# Patient Record
Sex: Female | Born: 1950 | Race: Black or African American | Hispanic: No | Smoking: Former smoker
Health system: Southern US, Community
[De-identification: ages and names within clinical notes are randomized; demographics above are authoritative.]

## PROBLEM LIST (undated history)

## (undated) DIAGNOSIS — H903 Sensorineural hearing loss, bilateral: Secondary | ICD-10-CM

## (undated) DIAGNOSIS — I10 Essential (primary) hypertension: Secondary | ICD-10-CM

## (undated) DIAGNOSIS — J40 Bronchitis, not specified as acute or chronic: Secondary | ICD-10-CM

## (undated) DIAGNOSIS — K219 Gastro-esophageal reflux disease without esophagitis: Secondary | ICD-10-CM

## (undated) DIAGNOSIS — M1990 Unspecified osteoarthritis, unspecified site: Secondary | ICD-10-CM

## (undated) DIAGNOSIS — F329 Major depressive disorder, single episode, unspecified: Secondary | ICD-10-CM

## (undated) DIAGNOSIS — Z79899 Other long term (current) drug therapy: Secondary | ICD-10-CM

## (undated) DIAGNOSIS — F419 Anxiety disorder, unspecified: Secondary | ICD-10-CM

## (undated) DIAGNOSIS — J302 Other seasonal allergic rhinitis: Secondary | ICD-10-CM

## (undated) DIAGNOSIS — F32A Depression, unspecified: Secondary | ICD-10-CM

## (undated) DIAGNOSIS — E1169 Type 2 diabetes mellitus with other specified complication: Secondary | ICD-10-CM

## (undated) DIAGNOSIS — E559 Vitamin D deficiency, unspecified: Secondary | ICD-10-CM

## (undated) DIAGNOSIS — D72820 Lymphocytosis (symptomatic): Secondary | ICD-10-CM

## (undated) DIAGNOSIS — E119 Type 2 diabetes mellitus without complications: Secondary | ICD-10-CM

## (undated) HISTORY — DX: Other seasonal allergic rhinitis: J30.2

## (undated) HISTORY — DX: Lymphocytosis (symptomatic): D72.820

## (undated) HISTORY — PX: EYE SURGERY: SHX253

## (undated) HISTORY — DX: Vitamin D deficiency, unspecified: E55.9

## (undated) HISTORY — DX: Depression, unspecified: F32.A

## (undated) HISTORY — PX: ABDOMINAL HYSTERECTOMY: SHX81

## (undated) HISTORY — DX: Major depressive disorder, single episode, unspecified: F32.9

## (undated) HISTORY — DX: Sensorineural hearing loss, bilateral: H90.3

## (undated) HISTORY — DX: Other long term (current) drug therapy: Z79.899

## (undated) HISTORY — PX: OTHER SURGICAL HISTORY: SHX169

## (undated) HISTORY — DX: Mixed hyperlipidemia: E11.69

## (undated) HISTORY — PX: TONSILLECTOMY: SUR1361

## (undated) HISTORY — DX: Unspecified osteoarthritis, unspecified site: M19.90

---

## 1998-06-01 ENCOUNTER — Other Ambulatory Visit: Admission: RE | Admit: 1998-06-01 | Discharge: 1998-06-01 | Payer: Self-pay | Admitting: Obstetrics & Gynecology

## 1998-11-26 ENCOUNTER — Encounter: Admission: RE | Admit: 1998-11-26 | Discharge: 1998-12-12 | Payer: Self-pay | Admitting: Family Medicine

## 1999-07-11 ENCOUNTER — Other Ambulatory Visit: Admission: RE | Admit: 1999-07-11 | Discharge: 1999-07-11 | Payer: Self-pay | Admitting: *Deleted

## 2004-03-19 ENCOUNTER — Ambulatory Visit (HOSPITAL_COMMUNITY): Admission: RE | Admit: 2004-03-19 | Discharge: 2004-03-19 | Payer: Self-pay | Admitting: Gastroenterology

## 2004-05-16 ENCOUNTER — Ambulatory Visit (HOSPITAL_COMMUNITY): Admission: RE | Admit: 2004-05-16 | Discharge: 2004-05-16 | Payer: Self-pay | Admitting: Orthopedic Surgery

## 2004-05-24 ENCOUNTER — Other Ambulatory Visit: Admission: RE | Admit: 2004-05-24 | Discharge: 2004-05-24 | Payer: Self-pay | Admitting: Family Medicine

## 2005-09-18 ENCOUNTER — Other Ambulatory Visit: Admission: RE | Admit: 2005-09-18 | Discharge: 2005-09-18 | Payer: Self-pay | Admitting: Family Medicine

## 2006-01-01 ENCOUNTER — Ambulatory Visit (HOSPITAL_BASED_OUTPATIENT_CLINIC_OR_DEPARTMENT_OTHER): Admission: RE | Admit: 2006-01-01 | Discharge: 2006-01-02 | Payer: Self-pay | Admitting: Orthopedic Surgery

## 2006-02-17 HISTORY — PX: ROTATOR CUFF REPAIR: SHX139

## 2006-07-23 ENCOUNTER — Ambulatory Visit: Payer: Self-pay | Admitting: Internal Medicine

## 2006-08-18 ENCOUNTER — Ambulatory Visit: Payer: Self-pay | Admitting: Internal Medicine

## 2006-10-27 ENCOUNTER — Ambulatory Visit: Payer: Self-pay | Admitting: Internal Medicine

## 2007-02-18 HISTORY — PX: BUNIONECTOMY: SHX129

## 2007-09-14 ENCOUNTER — Encounter: Admission: RE | Admit: 2007-09-14 | Discharge: 2007-09-14 | Payer: Self-pay | Admitting: Family Medicine

## 2008-02-28 ENCOUNTER — Ambulatory Visit: Payer: Self-pay | Admitting: Family Medicine

## 2008-02-28 DIAGNOSIS — M2140 Flat foot [pes planus] (acquired), unspecified foot: Secondary | ICD-10-CM | POA: Insufficient documentation

## 2008-03-13 ENCOUNTER — Ambulatory Visit: Payer: Self-pay | Admitting: Family Medicine

## 2008-12-05 ENCOUNTER — Encounter: Admission: RE | Admit: 2008-12-05 | Discharge: 2008-12-05 | Payer: Self-pay | Admitting: Family Medicine

## 2008-12-14 ENCOUNTER — Encounter: Admission: RE | Admit: 2008-12-14 | Discharge: 2008-12-14 | Payer: Self-pay | Admitting: Family Medicine

## 2010-02-22 ENCOUNTER — Encounter
Admission: RE | Admit: 2010-02-22 | Discharge: 2010-02-22 | Payer: Self-pay | Source: Home / Self Care | Attending: Family Medicine | Admitting: Family Medicine

## 2010-04-06 ENCOUNTER — Inpatient Hospital Stay (INDEPENDENT_AMBULATORY_CARE_PROVIDER_SITE_OTHER)
Admission: RE | Admit: 2010-04-06 | Discharge: 2010-04-06 | Disposition: A | Discharge status: Home / Self Care | Payer: Commercial Indemnity | Source: Ambulatory Visit | Attending: Family Medicine | Admitting: Family Medicine

## 2010-04-06 DIAGNOSIS — G5750 Tarsal tunnel syndrome, unspecified lower limb: Secondary | ICD-10-CM

## 2010-07-05 NOTE — Op Note (Signed)
NAMECHARMIKA, Alyssa Armstrong             ACCOUNT NO.:  [id number]   MEDICAL RECORD NO.:  [id number]          PATIENT TYPE:  AMB   LOCATION:  DSC                          FACILITY:  [facility]   PHYSICIAN:  Katy Fitch. Sypher, M.D. DATE OF BIRTH:  Jul 05, 1950   DATE OF PROCEDURE:  01/01/2006  DATE OF DISCHARGE:                                 OPERATIVE REPORT   PREOPERATIVE DIAGNOSIS:  Chronic stage III impingement, right shoulder, with  both plain film and MRI-documented acromioclavicular joint arthropathy with  compromise of the supraspinatus outlet and sclerosis of the greater  tuberosity suggesting chronic rotator cuff tear.  MRI preoperatively on  November 13, 2005, demonstrated a full-thickness rotator cuff tear of the  supraspinatus and conjoined tendon.   POSTOPERATIVE DIAGNOSES:  1. Degenerative type 1 superior labrum anterior and posterior labral tear.  2. Full-thickness deep surface rotator cuff tear of supraspinatus and      infraspinatus tendons.  3. Chronic acromioclavicular joint arthropathy with bursitis and full-      thickness rotator cuff tear due to chronic stage III impingement.   OPERATION:  1. Diagnostic arthroscopy, right glenohumeral joint, followed by      arthroscopic debridement of labrum, degenerative rotator cuff tear, and      arthroscopic synovectomy.  2. Arthroscopic subacromial decompression with bursectomy, coracoacromial      ligament release and acromioplasty.  3. Arthroscopic distal clavicle resection.  4. Muscle split mini incision open reconstruction of supraspinatus and      infraspinatus chronic rotator cuff tear.   OPERATING SURGEON:  Katy Fitch. Sypher, M.D.   ASSISTANT:  Molly Maduro Dasnoit PA-C.   ANESTHESIA:  General endotracheal supplemented by a right interscalene  block, supervising anesthesiologist is Dr. Gypsy Balsam.   INDICATIONS:  Alyssa Armstrong is a 60 year old woman whom I have followed  for 5 years for right shoulder pain.  She has a history  of AC arthropathy  and stage II impingement in 2002 that was treated with injection and  therapy.  She subsequently returned 2 years later and had increased signs of  impingement and a probable stage III evolution to a rotator cuff tear.  At  the time we advised her to consider arthroscopic intervention anticipating  decompression, distal clavicle resection and possible repair the cuff.  Alyssa Armstrong decided to wait for a while longer.  She then returned in the late  summer of 2007 with significant night pain, weakness of abduction and signs  of chronic stage III impingement.   A repeat MRI in November 13, 2005, documented a degenerative tendinopathy  of the supraspinatus and infraspinatus tendons, labral degenerative changes,  and unfavorable acromial and AC anatomy.   We recommended proceeding with diagnostic arthroscopy at this time  anticipating subacromial decompression, distal clavicle resection,  debridement of her labrum and a probable rotator cuff repair.   Preoperatively she was seen in consultation by Dr. Gypsy Balsam, who recommended an  interscalene block as well as general endotracheal anesthesia.   After informed consent, Dr. Gypsy Balsam placed an interscalene block in the  holding area with excellent anesthesia of the right forequarter.  After informed consent, [name] is brought to the operating room at  this time.   PROCEDURE:  Alyssa Armstrong is brought to operating room and placed in  supine position upon the operating table.   Following the induction of general endotracheal anesthesia, she was  carefully positioned in the beach-chair position with the aid of a torso and  head holder designed for shoulder arthroscopy.  PAS compression devices were  used on her calves.   The entire right upper extremity and forequarter were prepped with DuraPrep  and draped with impervious arthroscopy drapes.  The procedure commenced with  distension of her right shoulder with 20  mL of sterile saline brought in  through an anterior spinal needle approach.  The scope was placed  atraumatically through a standard posterior viewing portal using blunt  technique.  Diagnostic arthroscopy revealed a type 1 SLAP lesion with  degenerative labral changes, moderate synovitis anteriorly, and a full  thickness deep surface rotator cuff tear involving 90% of the supraspinatus  and 85% of the infraspinatus.   Photographic documentation of the rotator cuff predicament was accomplished,  followed by debridement of the labrum, synovitis and deep surface rotator  cuff fragments with a 4.5-mm suction shaver.   The arthroscope was removed from the glenohumeral joint and placed in the  subacromial space.  A florid bursitis was noted.  After bursectomy, the  anatomy of the coracoacromial arch was studied.  There was a large medial  osteophyte at the acromial side of the [facility] joint and a prominent inferior  distal clavicle.   The AC capsule was taken down and after photographic documentation of the [facility]  anatomy, the coracoacromial ligament was released with the cutting cautery.  Hemostasis was achieved with bipolar cautery.  The acromion was leveled to a  type 1 morphology and the distal 18 mm of clavicle was removed with the  arthroscopic bur.  After a thorough bursectomy and cleaning of all debris  from the distal clavicle resection, the cuff was carefully inspected.  There  was noted to be a 1 cm window full-thickness through the supraspinatus that  allowed visualization of the cuff tear with the scope.  The entire  supraspinatus and most of the infraspinatus was undermined and degenerative.   We then converted to an open muscle-splitting mini incision rotator cuff  repair.   A 3 cm incision was fashioned at the junction the anterior and middle third  of the deltoid.  The raphe of the muscle was split, the bursa was incised, and the rotator cuff tear visualized directly.  The  margins of the bursal  surface tear were enlarged to allow full access to the greater tuberosity.  The greater tuberosity was decorticated with a power bur and its profile  lowered approximately 3 mm.  Two biocorkscrew anchors were placed, one at  the central portion of the infraspinatus at the medial margin of the greater  tuberosity at the hyaline articular cartilage line and a second at the  center of the supraspinatus.  A single McLaughlin-type grasping suture was  placed at the conjoint tendon and was used to advance the conjoint tendon  anatomically laterally, inserting on the greater tuberosity through bone  holes.  Prior to tying the McLaughlin suture, the biocorkscrew sutures were  placed for a total of four mattress sutures insetting the supraspinatus and  infraspinatus anatomically on the decorticated greater tuberosity.   The McLaughlin suture was then tensioned with the arm in abduction,  advancing  the cuff anatomically.  The redundant tendon tissues were removed  with scalpel dissection, followed by tying of the mattress sutures insetting  the tendons to the decorticated tuberosity.  The repair was then finished  along its margin with a series of figure-of-eight 0 Vicryl sutures with  knots buried.   The deltoid split was then repaired with a corner suture of #2 FiberWire to  the periosteum of the greater tuberosity to reinforce the deltoid insertion,  and the deltoid split was repaired with simple suture of 0 Vicryl with knots  buried.  The skin was repaired with subdermal suture of 2-0 Vicryl and  intradermal 3-0 Prolene and Steri-Strips.   For aftercare Alyssa Armstrong's wounds were dressed with Xeroflo, sterile  gauze and paper tape.  She will be admitted to the recovery care center for  observation of her vital signs.  We anticipate IV Ancef 1 g q.8h. x3 doses  as a prophylactic antibiotic and appropriate analgesics in the form of p.o.  and IV Dilaudid and IV PCA  morphine if her block wears off this evening.  There were no apparent complications.   Alyssa Armstrong was transferred to the recovery room with stable vital signs.      Katy Fitch Sypher, M.D.  Electronically Signed     RVS/MEDQ  D:  01/01/2006  T:  01/01/2006  Job:  [id number]

## 2010-07-05 NOTE — Op Note (Signed)
[name], [name]             ACCOUNT NO.:  [id number]   MEDICAL RECORD NO.:  [id number]          PATIENT TYPE:  AMB   LOCATION:  ENDO                         [facility]ACILITY:  [facility]   PHYSICIAN:  Danise Edge, M.D.   DATE OF BIRTH:  November 23, 1950   DATE OF PROCEDURE:  03/19/2004  DATE OF DISCHARGE:                                 OPERATIVE REPORT   PROCEDURE:  Diagnostic colonoscopy.   INDICATIONS FOR PROCEDURE:  Alyssa Armstrong is a 60 year old female born  1950-07-12.  Alyssa Armstrong is undergoing diagnostic colonoscopy to  evaluate intermittent painless hematochezia.   ENDOSCOPIST:  Danise Edge, M.D.   PREMEDICATION:  Versed 6 mg, Demerol 60 mg.   DESCRIPTION OF PROCEDURE:  After obtaining informed consent, Alyssa Armstrong  was placed in the left lateral decubitus position.  I administered  intravenous Demerol and intravenous Versed to achieve conscious sedation for  the procedure.  The patient's blood pressure, oxygen saturation, and cardiac  rhythm were monitored throughout the procedure and documented in the medical  record.   Anal inspection and digital rectal exam were normal.  The prostate was  nonnodular.  The Olympus adjustable pediatric colonoscope was introduced  into the rectum and advanced to the cecum.  The colonic preparation for the  exam today was excellent.   Rectum:  Normal.   Sigmoid colon and descending colon:  Normal.   Splenic flexure:  Normal.   Transverse colon:  Normal.   Hepatic flexure:  Normal.   Ascending colon:  Normal.   Cecum and ileocecal valve:  Normal.   ASSESSMENT:  Normal proctocolonoscopy to the cecum.      MJ/MEDQ  D:  03/19/2004  T:  03/19/2004  Job:  [id number]   cc:   Gretta Arab. Valentina Lucks, M.D.  [address] [place]  [place] [address]  Fax: [phone]

## 2010-10-14 ENCOUNTER — Emergency Department (HOSPITAL_COMMUNITY)
Admission: EM | Admit: 2010-10-14 | Discharge: 2010-10-14 | Disposition: A | Discharge status: Home / Self Care | Payer: Commercial Indemnity | Attending: Emergency Medicine | Admitting: Emergency Medicine

## 2010-10-14 ENCOUNTER — Inpatient Hospital Stay (INDEPENDENT_AMBULATORY_CARE_PROVIDER_SITE_OTHER)
Admission: RE | Admit: 2010-10-14 | Discharge: 2010-10-14 | Disposition: A | Discharge status: Home / Self Care | Payer: Commercial Indemnity | Source: Ambulatory Visit | Attending: Family Medicine | Admitting: Family Medicine

## 2010-10-14 ENCOUNTER — Emergency Department (HOSPITAL_COMMUNITY): Payer: Commercial Indemnity

## 2010-10-14 DIAGNOSIS — R1013 Epigastric pain: Secondary | ICD-10-CM | POA: Insufficient documentation

## 2010-10-14 DIAGNOSIS — R10812 Left upper quadrant abdominal tenderness: Secondary | ICD-10-CM

## 2010-10-14 DIAGNOSIS — E7800 Pure hypercholesterolemia, unspecified: Secondary | ICD-10-CM | POA: Insufficient documentation

## 2010-10-14 DIAGNOSIS — R112 Nausea with vomiting, unspecified: Secondary | ICD-10-CM | POA: Insufficient documentation

## 2010-10-14 LAB — BASIC METABOLIC PANEL
CO2: 29 mEq/L (ref 19–32)
Chloride: 103 mEq/L (ref 96–112)
Creatinine, Ser: 0.76 mg/dL (ref 0.50–1.10)
GFR calc Af Amer: >60 mL/min (ref >60)
Potassium: 4 mEq/L (ref 3.5–5.1)
Sodium: 141 mEq/L (ref 135–145)

## 2010-10-14 LAB — DIFFERENTIAL
Basophils Absolute: 0 10*3/uL (ref 0.0–0.1)
Basophils Relative: 1 % (ref 0–1)
Eosinophils Absolute: 0.1 10*3/uL (ref 0.0–0.7)
Eosinophils Relative: 1 % (ref 0–5)
Neutrophils Relative %: 38 % — ABNORMAL LOW (ref 43–77)

## 2010-10-14 LAB — HEPATIC FUNCTION PANEL
AST: 25 U/L (ref 0–37)
Bilirubin, Direct: <0.1 mg/dL (ref 0.0–0.3)
Total Protein: 8.2 g/dL (ref 6.0–8.3)

## 2010-10-14 LAB — CBC
MCV: 73.8 fL — ABNORMAL LOW (ref 78.0–100.0)
Platelets: 283 10*3/uL (ref 150–400)
RBC: 5.26 MIL/uL — ABNORMAL HIGH (ref 3.87–5.11)
RDW: 14.7 % (ref 11.5–15.5)
WBC: 7.8 10*3/uL (ref 4.0–10.5)

## 2010-10-14 LAB — URINALYSIS, ROUTINE W REFLEX MICROSCOPIC
Ketones, ur: NEGATIVE mg/dL
Leukocytes, UA: NEGATIVE
Nitrite: NEGATIVE
pH: 5.5 (ref 5.0–8.0)

## 2011-04-28 DIAGNOSIS — J302 Other seasonal allergic rhinitis: Secondary | ICD-10-CM | POA: Insufficient documentation

## 2011-04-28 DIAGNOSIS — E559 Vitamin D deficiency, unspecified: Secondary | ICD-10-CM | POA: Insufficient documentation

## 2011-10-21 ENCOUNTER — Encounter (HOSPITAL_COMMUNITY): Payer: Self-pay | Admitting: *Deleted

## 2011-10-21 ENCOUNTER — Emergency Department (INDEPENDENT_AMBULATORY_CARE_PROVIDER_SITE_OTHER): Payer: 59

## 2011-10-21 ENCOUNTER — Emergency Department (INDEPENDENT_AMBULATORY_CARE_PROVIDER_SITE_OTHER)
Admission: EM | Admit: 2011-10-21 | Discharge: 2011-10-21 | Disposition: A | Discharge status: Home / Self Care | Payer: 59 | Source: Home / Self Care | Attending: Family Medicine | Admitting: Family Medicine

## 2011-10-21 DIAGNOSIS — R112 Nausea with vomiting, unspecified: Secondary | ICD-10-CM

## 2011-10-21 DIAGNOSIS — J40 Bronchitis, not specified as acute or chronic: Secondary | ICD-10-CM

## 2011-10-21 HISTORY — DX: Gastro-esophageal reflux disease without esophagitis: K21.9

## 2011-10-21 LAB — POCT URINALYSIS DIP (DEVICE)
Bilirubin Urine: NEGATIVE
Glucose, UA: NEGATIVE mg/dL
Ketones, ur: NEGATIVE mg/dL
Nitrite: NEGATIVE

## 2011-10-21 MED ORDER — ONDANSETRON HCL 4 MG PO TABS: 4.0000 mg | ORAL_TABLET | Freq: Four times a day (QID) | ORAL | Status: AC

## 2011-10-21 MED ORDER — ALBUTEROL SULFATE HFA 108 (90 BASE) MCG/ACT IN AERS: 2.0000 | INHALATION_SPRAY | RESPIRATORY_TRACT | Status: DC | PRN

## 2011-10-21 MED ORDER — ONDANSETRON 4 MG PO TBDP
4.0000 mg | ORAL_TABLET | Freq: Once | ORAL | Status: AC
  Administered 2011-10-21: 4 mg via ORAL

## 2011-10-21 MED ORDER — ONDANSETRON 4 MG PO TBDP
ORAL_TABLET | ORAL | Status: AC
  Filled 2011-10-21: qty 1

## 2011-10-21 NOTE — ED Notes (Signed)
Pt reports vomiting multiple times a day since Saturday. No diarrhea or abdomen pain. Pt also reports hard nodule in center of chest that has recently formed. Previous similar episode in March with no known cause.

## 2011-10-21 NOTE — ED Provider Notes (Signed)
History     CSN: [id number]  Arrival date & time 10/21/11  1436   First MD Initiated Contact with Patient 10/21/11 1626      Chief Complaint  Patient presents with  . Emesis    (Consider location/radiation/quality/duration/timing/severity/associated sxs/prior treatment) HPI See previous note from Lonia Skinner [place], [place] Results of UA and Chest xray below. Past Medical History  Diagnosis Date  . GERD (gastroesophageal reflux disease)     Past Surgical History  Procedure Date  . Rotator cuff suregery     Family History  Problem Relation Age of Onset  . Family history unknown: Yes    History  Substance Use Topics  . Smoking status: Never Smoker   . Smokeless tobacco: Not on file  . Alcohol Use: No    OB History    Grav Para Term Preterm Abortions TAB SAB Ect Mult Living                  Review of Systems  Allergies  Vicodin  Home Medications   Current Outpatient Rx  Name Route Sig Dispense Refill  . ATORVASTATIN CALCIUM 10 MG PO TABS Oral Take 10 mg by mouth daily.    Marland Kitchen VITAMIN D 1000 UNITS PO TABS Oral Take 1,000 Units by mouth daily.    Marland Kitchen RANITIDINE HCL 150 MG PO CAPS Oral Take 150 mg by mouth 2 (two) times daily.    . VENLAFAXINE HCL 100 MG PO TABS Oral Take 100 mg by mouth 2 (two) times daily.    . ALBUTEROL SULFATE HFA 108 (90 BASE) MCG/ACT IN AERS Inhalation Inhale 2 puffs into the lungs every 4 (four) hours as needed for wheezing. 1 Inhaler 1  . ONDANSETRON HCL 4 MG PO TABS Oral Take 1 tablet (4 mg total) by mouth every 6 (six) hours. 12 tablet 0    BP 141/70  Pulse 80  Temp 98.7 F (37.1 C) (Oral)  Resp 16  SpO2 100%  Physical Exam  Nursing note and vitals reviewed. Constitutional: She is oriented to person, place, and time. Vital signs are normal. She appears well-developed and well-nourished. She is active and cooperative.  HENT:  Head: Normocephalic.  Eyes: Conjunctivae are normal. Pupils are equal, round, and reactive to light. No scleral  icterus.  Neck: Trachea normal. Neck supple.  Cardiovascular: Normal rate and regular rhythm.   Pulmonary/Chest: Effort normal and breath sounds normal. No respiratory distress. She has no wheezes. She has no rales.  Abdominal: Soft. Bowel sounds are normal. There is no tenderness.  Neurological: She is alert and oriented to person, place, and time. No cranial nerve deficit or sensory deficit.  Skin: Skin is warm and dry.  Psychiatric: She has a normal mood and affect. Her speech is normal and behavior is normal. Judgment and thought content normal. Cognition and memory are normal.    ED Course  Procedures (including critical care time)  Labs Reviewed  POCT URINALYSIS DIP (DEVICE) - Abnormal; Notable for the following:    Leukocytes, UA LARGE (*)  Biochemical Testing Only. Please order routine urinalysis from main lab if confirmatory testing is needed.   All other components within normal limits   Dg Chest 2 View  10/21/2011  *RADIOLOGY REPORT*  Clinical Data: Weakness, vomiting, nausea  CHEST - 2 VIEW  Comparison: None.  Findings: Normal cardiac silhouette and mediastinal contours. There is mild diffuse thickening of the pulmonary interstitium.  No focal airspace opacities.  Mild eventration right hemidiaphragm. No pleural effusion  or pneumothorax.  No acute osseous abnormalities.  IMPRESSION: Overall findings most suggestive of airways disease/bronchitis.  No focal airspace opacities to suggest pneumonia.   Original Report Authenticated By: Waynard Reeds, M.D.      1. Nausea & vomiting   2. Bronchitis       MDM  Zofran for nausea, albuterol and take your zyrtec for cough symptoms.          Johnsie Kindred, NP 10/21/11 1637

## 2011-10-21 NOTE — ED Provider Notes (Signed)
History     CSN: [id number]  Arrival date & time 10/21/11  1436   None     Chief Complaint  Patient presents with  . Emesis    (Consider location/radiation/quality/duration/timing/severity/associated sxs/prior treatment) Patient is a 61 y.o. female presenting with vomiting. The history is provided by the patient. No language interpreter was used.  Emesis  This is a new problem. Episode onset: 2 days ago. The problem has not changed since onset.There has been no fever. Pertinent negatives include no abdominal pain and no chills.  Pt complains of vomiting on and off since Saturday.  Pt reports she had the same thing in March.  Pt reports she has this about every 6 months.  Pt complains of a knot upper abdomen.  I palpated with pt,  Area feels like end of xiphoid Past Medical History  Diagnosis Date  . GERD (gastroesophageal reflux disease)     Past Surgical History  Procedure Date  . Rotator cuff suregery     Family History  Problem Relation Age of Onset  . Family history unknown: Yes    History  Substance Use Topics  . Smoking status: Never Smoker   . Smokeless tobacco: Not on file  . Alcohol Use: No    OB History    Grav Para Term Preterm Abortions TAB SAB Ect Mult Living                  Review of Systems  Constitutional: Negative for chills.  Gastrointestinal: Positive for vomiting. Negative for abdominal pain.  All other systems reviewed and are negative.    Allergies  Vicodin  Home Medications   Current Outpatient Rx  Name Route Sig Dispense Refill  . ATORVASTATIN CALCIUM 10 MG PO TABS Oral Take 10 mg by mouth daily.    Marland Kitchen VITAMIN D 1000 UNITS PO TABS Oral Take 1,000 Units by mouth daily.    Marland Kitchen RANITIDINE HCL 150 MG PO CAPS Oral Take 150 mg by mouth 2 (two) times daily.    . VENLAFAXINE HCL 100 MG PO TABS Oral Take 100 mg by mouth 2 (two) times daily.      BP 141/70  Pulse 80  Temp 98.7 F (37.1 C) (Oral)  Resp 16  SpO2 100%  Physical Exam    Nursing note and vitals reviewed. Constitutional: She appears well-developed.  HENT:  Head: Normocephalic and atraumatic.  Right Ear: External ear normal.  Left Ear: External ear normal.  Nose: Nose normal.  Mouth/Throat: Oropharynx is clear and moist.  Eyes: Conjunctivae are normal. Pupils are equal, round, and reactive to light.  Neck: Normal range of motion.  Cardiovascular: Normal rate and normal heart sounds.   Pulmonary/Chest: Effort normal and breath sounds normal.  Abdominal: Soft. She exhibits no distension. There is no tenderness. There is no guarding.  Musculoskeletal: Normal range of motion.  Neurological: She is alert.  Skin: Skin is warm.  Psychiatric: She has a normal mood and affect.   Chest,  Knot pt feels seems to be end of xiphoid.   ED Course  Procedures (including critical care time)  Labs Reviewed - No data to display No results found.   No diagnosis found.    MDM  Care turned over to [facility] NP.  UA and chest xray pending,  Fluid challenge after xray if not able to drink,  To ED.           Lonia Skinner [place], [place] 10/21/11 [phone]

## 2011-10-22 NOTE — ED Provider Notes (Signed)
Medical screening examination/treatment/procedure(s) were performed by resident physician or non-physician practitioner and as supervising physician I was immediately available for consultation/collaboration.   Barkley Bruns MD.    Linna Hoff, MD 10/22/11 2131

## 2011-10-22 NOTE — ED Provider Notes (Signed)
Medical screening examination/treatment/procedure(s) were performed by resident physician or non-physician practitioner and as supervising physician I was immediately available for consultation/collaboration.   Hamad Whyte DOUGLAS MD.    Alzena Gerber D Amando Chaput, MD 10/22/11 2132 

## 2011-11-15 ENCOUNTER — Observation Stay (HOSPITAL_COMMUNITY)
Admission: EM | Admit: 2011-11-15 | Discharge: 2011-11-16 | Disposition: A | Discharge status: Home / Self Care | Payer: 59 | Attending: Emergency Medicine | Admitting: Emergency Medicine

## 2011-11-15 ENCOUNTER — Encounter (HOSPITAL_COMMUNITY): Payer: Self-pay | Admitting: Emergency Medicine

## 2011-11-15 DIAGNOSIS — K089 Disorder of teeth and supporting structures, unspecified: Secondary | ICD-10-CM | POA: Insufficient documentation

## 2011-11-15 DIAGNOSIS — L03211 Cellulitis of face: Principal | ICD-10-CM | POA: Insufficient documentation

## 2011-11-15 DIAGNOSIS — K044 Acute apical periodontitis of pulpal origin: Secondary | ICD-10-CM | POA: Insufficient documentation

## 2011-11-15 DIAGNOSIS — L0201 Cutaneous abscess of face: Principal | ICD-10-CM | POA: Insufficient documentation

## 2011-11-15 DIAGNOSIS — K047 Periapical abscess without sinus: Secondary | ICD-10-CM

## 2011-11-15 LAB — CBC
HCT: 41.7 % (ref 36.0–46.0)
MCHC: 33.6 g/dL (ref 30.0–36.0)
MCV: 74.9 fL — ABNORMAL LOW (ref 78.0–100.0)
RDW: 15.2 % (ref 11.5–15.5)

## 2011-11-15 LAB — BASIC METABOLIC PANEL
BUN: 7 mg/dL (ref 6–23)
Calcium: 9.8 mg/dL (ref 8.4–10.5)
Creatinine, Ser: 0.73 mg/dL (ref 0.50–1.10)
GFR calc Af Amer: >90 mL/min (ref >90)
GFR calc non Af Amer: >90 mL/min (ref >90)
Potassium: 3.9 mEq/L (ref 3.5–5.1)

## 2011-11-15 MED ORDER — SODIUM CHLORIDE 0.9 % IV SOLN
1000.0000 mL | INTRAVENOUS | Status: DC
  Administered 2011-11-15 – 2011-11-16 (×3): 1000 mL via INTRAVENOUS

## 2011-11-15 MED ORDER — CLINDAMYCIN PHOSPHATE 600 MG/50ML IV SOLN
600.0000 mg | Freq: Three times a day (TID) | INTRAVENOUS | Status: DC
  Administered 2011-11-15 – 2011-11-16 (×2): 600 mg via INTRAVENOUS
  Filled 2011-11-15 (×2): qty 50

## 2011-11-15 MED ORDER — MORPHINE SULFATE 4 MG/ML IJ SOLN
4.0000 mg | INTRAMUSCULAR | Status: DC | PRN
  Administered 2011-11-15 – 2011-11-16 (×2): 4 mg via INTRAVENOUS
  Filled 2011-11-15 (×2): qty 1

## 2011-11-15 MED ORDER — ONDANSETRON HCL 4 MG/2ML IJ SOLN
4.0000 mg | Freq: Four times a day (QID) | INTRAMUSCULAR | Status: DC | PRN
  Administered 2011-11-15: 4 mg via INTRAVENOUS
  Filled 2011-11-15: qty 2

## 2011-11-15 NOTE — ED Notes (Signed)
Reported given [facility] manager. Transported to CDU 9 by Josh NT.

## 2011-11-15 NOTE — ED Provider Notes (Signed)
History  This chart was scribed for Ward Givens, MD by Bennett Scrape. This patient was seen in room TR04C/TR04C and the patient's care was started at 6:05PM.  CSN: [id number]  Arrival date & time 11/15/11  1434   None     Chief Complaint  Patient presents with  . Dental Pain    The history is provided by the patient. No language interpreter was used.    Alyssa Armstrong is a 61 y.o. female who presents to the Emergency Department complaining of 2 months of gradual onset, gradually worsening, constant right upper dental pain with associated right sided facial swelling that she noticed this morning. States her tooth pain got worse last night. The pain is worse with chewing. She reports that she has experienced similar dental problems in the past that needed to be extracted. She does not have a dentist. She denies fever, nausea, emesis, and HA as associated symptoms.She has a h/o GERD. She denies smoking and alcohol use.  She denies having a dentist currently. Dr. Duanne Guess is PCP.  Past Medical History  Diagnosis Date  . GERD (gastroesophageal reflux disease)     Past Surgical History  Procedure Date  . Rotator cuff suregery     No family history on file.  History  Substance Use Topics  . Smoking status: Never Smoker   . Smokeless tobacco: Not on file  . Alcohol Use: No  works as an Nurse, mental health  No OB history provided.  Review of Systems  Constitutional: Negative for fever and chills.  HENT: Positive for facial swelling and dental problem. Negative for trouble swallowing.   Gastrointestinal: Negative for nausea and vomiting.    Allergies  Vicodin  Home Medications   Current Outpatient Rx  Name Route Sig Dispense Refill  . ALBUTEROL SULFATE HFA 108 (90 BASE) MCG/ACT IN AERS Inhalation Inhale 2 puffs into the lungs every 4 (four) hours as needed for wheezing. 1 Inhaler 1  . ATORVASTATIN CALCIUM 10 MG PO TABS Oral Take 10 mg by mouth every evening.     Marland Kitchen  RANITIDINE HCL 150 MG PO CAPS Oral Take 150 mg by mouth 2 (two) times daily.    . VENLAFAXINE HCL 100 MG PO TABS Oral Take 100 mg by mouth 2 (two) times daily.    Marland Kitchen VITAMIN D (ERGOCALCIFEROL) 50000 UNITS PO CAPS Oral Take 50,000 Units by mouth every 7 (seven) days. Take on Monday      Triage Vitals: BP 145/73  Pulse 82  Temp 98.5 F (36.9 C) (Oral)  Resp 20  SpO2 100%  Vital signs normal    Physical Exam  Nursing note and vitals reviewed. Constitutional: She is oriented to person, place, and time. She appears well-developed and well-nourished. No distress.  HENT:  Head: Normocephalic and atraumatic.  Right Ear: External ear normal.  Left Ear: External ear normal.  Nose: Nose normal.       Missing several molars on the left lower jaw, right upper first premolar is tender, mild diffuse swelling of right face especially around the mid cheek and nose, tender to palpation in the same area of the infected tooth, jaw is non-tender to palpation. No obvious cavity seen.  Eyes: Conjunctivae normal and EOM are normal. Pupils are equal, round, and reactive to light.  Neck: Normal range of motion. Neck supple. No tracheal deviation present.  Cardiovascular: Normal rate.   Pulmonary/Chest: Effort normal. No respiratory distress.  Musculoskeletal: Normal range of motion.  Neurological: She is  alert and oriented to person, place, and time.  Skin: Skin is warm and dry.  Psychiatric: She has a normal mood and affect. Her behavior is normal.    ED Course  Procedures (including critical care time)   Medications  0.9 %  sodium chloride infusion (1000 mL Intravenous New Bag/Given 11/15/11 1933)  clindamycin (CLEOCIN) IVPB 600 mg (not administered)  morphine 4 MG/ML injection 4 mg (4 mg Intravenous Given 11/15/11 1933)  ondansetron (ZOFRAN) injection 4 mg (4 mg Intravenous Given 11/15/11 1933)     DIAGNOSTIC STUDIES: Oxygen Saturation is 100% on room air, normal by my interpretation.     COORDINATION OF CARE: 7:12PM-Discussed treatment plan which includes IV pain medications and antibiotics with pt at bedside and pt agreed to plan. Pt will be moved to CDU. Upon discharge, she will be provided with a referral to a dentist.   Results for orders placed during the hospital encounter of 11/15/11  CBC      Component Value Range   WBC 7.0  4.0 - 10.5 K/uL   RBC 5.57 (*) 3.87 - 5.11 MIL/uL   Hemoglobin 14.0  12.0 - 15.0 g/dL   HCT 16.1  09.6 - 04.5 %   MCV 74.9 (*) 78.0 - 100.0 fL   MCH 25.1 (*) 26.0 - 34.0 pg   MCHC 33.6  30.0 - 36.0 g/dL   RDW 40.9  81.1 - 91.4 %   Platelets 241  150 - 400 K/uL  BASIC METABOLIC PANEL      Component Value Range   Sodium 138  135 - 145 mEq/L   Potassium 3.9  3.5 - 5.1 mEq/L   Chloride 102  96 - 112 mEq/L   CO2 25  19 - 32 mEq/L   Glucose, Bld 97  70 - 99 mg/dL   BUN 7  6 - 23 mg/dL   Creatinine, Ser 7.82  0.50 - 1.10 mg/dL   Calcium 9.8  8.4 - 95.6 mg/dL   GFR calc non Af Amer >90  >90 mL/min   GFR calc Af Amer >90  >90 mL/min   Laboratory interpretation all normal      1. Facial cellulitis   2. Infected tooth     Plan place on cellulitis protocol and then discharge   Devoria Albe, MD, FACEP   MDM   I personally performed the services described in this documentation, which was scribed in my presence. The recorded information has been reviewed and considered.  Heide Scales, MD 11/15/11 2020

## 2011-11-15 NOTE — ED Notes (Signed)
Pt c/o right upper toothache onset last night.

## 2011-11-15 NOTE — ED Notes (Signed)
Patient was treated for sinus infection approx 2 mths ago,  She has swelling on the right side of her face.  Patient states in the past 2 days the swelling has returned and is worse.  She has swelling and pain in the right side of her face from below her eye to her neck 

## 2011-11-15 NOTE — ED Notes (Signed)
Patient was treated for sinus infection approx 2 mths ago,  She has swelling on the right side of her face.  Patient states in the past 2 days the swelling has returned and is worse.  She has swelling and pain in the right side of her face from below her eye to her neck

## 2011-11-16 MED ORDER — CLINDAMYCIN HCL 150 MG PO CAPS: 450.0000 mg | ORAL_CAPSULE | Freq: Three times a day (TID) | ORAL | Status: DC

## 2011-11-16 MED ORDER — OXYCODONE-ACETAMINOPHEN 5-325 MG PO TABS: 1.0000 | ORAL_TABLET | Freq: Four times a day (QID) | ORAL | Status: DC | PRN

## 2011-11-16 NOTE — ED Notes (Addendum)
Pt. Denies any pain presently.   Pt. Has redness and mild swelling to her rt. Cheek/facial area.  The swelling radiates from under her rt. Eye  Into her neck

## 2011-11-16 NOTE — ED Provider Notes (Signed)
Assumed care of patient in the CDU.  Patient is currently on Cellulitis Protocol.  Patient has received two doses on IV Clindamycin.  She reports that her pain and swelling has improved at this time.  VSS.  Patient alert and orientated, No acute distress, Poor dentition, Tenderness to palpation of the gingiva in the area of the infected tooth, Full ROM of the neck, No difficulty swallowing, No submental or submandibular lymphadenopathy.  Will discharge patient home with prescription for Clindamycin and pain medication.  Patient given referral to Dentist on call.    Pascal Lux [place], PA-C 11/16/11 1413

## 2011-11-16 NOTE — ED Provider Notes (Signed)
Patient was seen by me in her care was managed in the CDU by PA.  Ward Givens, MD 11/16/11 2035

## 2012-03-03 ENCOUNTER — Other Ambulatory Visit: Payer: Self-pay | Admitting: Family Medicine

## 2012-03-03 DIAGNOSIS — Z1231 Encounter for screening mammogram for malignant neoplasm of breast: Secondary | ICD-10-CM

## 2012-03-04 ENCOUNTER — Ambulatory Visit
Admission: RE | Admit: 2012-03-04 | Discharge: 2012-03-04 | Disposition: A | Discharge status: Home / Self Care | Payer: 59 | Source: Ambulatory Visit | Attending: Family Medicine | Admitting: Family Medicine

## 2012-03-04 DIAGNOSIS — Z1231 Encounter for screening mammogram for malignant neoplasm of breast: Secondary | ICD-10-CM

## 2012-03-25 ENCOUNTER — Encounter (HOSPITAL_COMMUNITY): Payer: Self-pay | Admitting: *Deleted

## 2012-03-25 ENCOUNTER — Emergency Department (HOSPITAL_COMMUNITY)
Admission: EM | Admit: 2012-03-25 | Discharge: 2012-03-26 | Disposition: A | Discharge status: Home / Self Care | Payer: 59 | Attending: Emergency Medicine | Admitting: Emergency Medicine

## 2012-03-25 DIAGNOSIS — I10 Essential (primary) hypertension: Secondary | ICD-10-CM | POA: Insufficient documentation

## 2012-03-25 DIAGNOSIS — Z8709 Personal history of other diseases of the respiratory system: Secondary | ICD-10-CM | POA: Insufficient documentation

## 2012-03-25 DIAGNOSIS — R112 Nausea with vomiting, unspecified: Secondary | ICD-10-CM | POA: Insufficient documentation

## 2012-03-25 DIAGNOSIS — K219 Gastro-esophageal reflux disease without esophagitis: Secondary | ICD-10-CM | POA: Insufficient documentation

## 2012-03-25 DIAGNOSIS — Z79899 Other long term (current) drug therapy: Secondary | ICD-10-CM | POA: Insufficient documentation

## 2012-03-25 DIAGNOSIS — R1084 Generalized abdominal pain: Secondary | ICD-10-CM | POA: Insufficient documentation

## 2012-03-25 HISTORY — DX: Bronchitis, not specified as acute or chronic: J40

## 2012-03-25 HISTORY — DX: Essential (primary) hypertension: I10

## 2012-03-25 LAB — URINALYSIS, MICROSCOPIC ONLY
Glucose, UA: NEGATIVE mg/dL
Ketones, ur: NEGATIVE mg/dL
Specific Gravity, Urine: 1.015 (ref 1.005–1.030)
pH: 7.5 (ref 5.0–8.0)

## 2012-03-25 LAB — COMPREHENSIVE METABOLIC PANEL
ALT: 47 U/L — ABNORMAL HIGH (ref 0–35)
Albumin: 4.7 g/dL (ref 3.5–5.2)
Alkaline Phosphatase: 99 U/L (ref 39–117)
Potassium: 3.8 mEq/L (ref 3.5–5.1)
Sodium: 141 mEq/L (ref 135–145)
Total Protein: 8.7 g/dL — ABNORMAL HIGH (ref 6.0–8.3)

## 2012-03-25 LAB — CBC WITH DIFFERENTIAL/PLATELET
Basophils Absolute: 0 10*3/uL (ref 0.0–0.1)
Eosinophils Absolute: 0.1 10*3/uL (ref 0.0–0.7)
Lymphocytes Relative: 34 % (ref 12–46)
MCHC: 35 g/dL (ref 30.0–36.0)
Neutrophils Relative %: 55 % (ref 43–77)
Platelets: 271 10*3/uL (ref 150–400)
RDW: 14.7 % (ref 11.5–15.5)

## 2012-03-25 MED ORDER — FAMOTIDINE 20 MG PO TABS: 20.0000 mg | ORAL_TABLET | Freq: Two times a day (BID) | ORAL | Status: DC

## 2012-03-25 MED ORDER — ONDANSETRON 8 MG PO TBDP: 8.0000 mg | ORAL_TABLET | Freq: Three times a day (TID) | ORAL | Status: DC | PRN

## 2012-03-25 MED ORDER — ONDANSETRON 4 MG PO TBDP
8.0000 mg | ORAL_TABLET | Freq: Once | ORAL | Status: AC
  Administered 2012-03-25: 8 mg via ORAL
  Filled 2012-03-25: qty 2

## 2012-03-25 NOTE — ED Notes (Signed)
Pt c/o vomiting since 9:15 this morning.  Some abdominal pain, nauseous.  Denies diarrhea, states she has been constipation.

## 2012-03-25 NOTE — ED Provider Notes (Signed)
History     CSN: [id number]  Arrival date & time 03/25/12  1947   First MD Initiated Contact with Patient 03/25/12 2300      Chief Complaint  Patient presents with  . Emesis    (Consider location/radiation/quality/duration/timing/severity/associated sxs/prior treatment) HPI 62 yo female presents to ER with complaint of vomiting since 9 AM. No diarrhea, had bowel movement today. Patient feels as though she's constipated. No sick contacts, no unusual foods. No fevers. Patient has diffuse crampy pain with vomiting. Patient with history of GERD and hypertension. No prior abdominal surgeries. Patient has been given an ODT Zofran prior to my evaluation and as tolerated by mouth with no further vomiting. She is feeling better  Past Medical History  Diagnosis Date  . GERD (gastroesophageal reflux disease)   . Hypertension   . Bronchitis     Past Surgical History  Procedure Date  . Rotator cuff suregery     History reviewed. No pertinent family history.  History  Substance Use Topics  . Smoking status: Never Smoker   . Smokeless tobacco: Not on file  . Alcohol Use: Yes    OB History    Grav Para Term Preterm Abortions TAB SAB Ect Mult Living                  Review of Systems  All other systems reviewed and are negative.    Allergies  Vicodin  Home Medications   Current Outpatient Rx  Name  Route  Sig  Dispense  Refill  . ALBUTEROL SULFATE HFA 108 (90 BASE) MCG/ACT IN AERS   Inhalation   Inhale 2 puffs into the lungs daily as needed. For shortness of breath or wheezing         . ATORVASTATIN CALCIUM 10 MG PO TABS   Oral   Take 10 mg by mouth every evening.          Marland Kitchen CETIRIZINE HCL 10 MG PO TABS   Oral   Take 10 mg by mouth daily.         . IBUPROFEN 200 MG PO TABS   Oral   Take 200 mg by mouth daily as needed. For pain         . LISINOPRIL 30 MG PO TABS   Oral   Take 30 mg by mouth daily.         Marland Kitchen SINGULAIR PO   Oral   Take 1 tablet by  mouth daily.         Marland Kitchen RANITIDINE HCL 150 MG PO CAPS   Oral   Take 150 mg by mouth 2 (two) times daily.         . VENLAFAXINE HCL 100 MG PO TABS   Oral   Take 100 mg by mouth daily.         Marland Kitchen VITAMIN D (ERGOCALCIFEROL) 50000 UNITS PO CAPS   Oral   Take 50,000 Units by mouth every 7 (seven) days. Take on Monday         . FAMOTIDINE 20 MG PO TABS   Oral   Take 1 tablet (20 mg total) by mouth 2 (two) times daily.   30 tablet   0   . ONDANSETRON 8 MG PO TBDP   Oral   Take 1 tablet (8 mg total) by mouth every 8 (eight) hours as needed for nausea.   20 tablet   0     BP 138/82  Pulse 71  Temp 97.7 F (  36.5 C) (Oral)  Resp 18  SpO2 100%  Physical Exam  Nursing note and vitals reviewed. Constitutional: She is oriented to person, place, and time. She appears well-developed and well-nourished.  HENT:  Head: Normocephalic and atraumatic.  Nose: Nose normal.  Mouth/Throat: Oropharynx is clear and moist.  Eyes: Conjunctivae normal and EOM are normal. Pupils are equal, round, and reactive to light.  Neck: Normal range of motion. Neck supple. No JVD present. No tracheal deviation present. No thyromegaly present.  Cardiovascular: Normal rate, regular rhythm, normal heart sounds and intact distal pulses.  Exam reveals no gallop and no friction rub.   No murmur heard. Pulmonary/Chest: Effort normal and breath sounds normal. No stridor. No respiratory distress. She has no wheezes. She has no rales. She exhibits no tenderness.  Abdominal: Soft. She exhibits no distension and no mass. There is tenderness (Diffuse mild tenderness). There is no rebound and no guarding.       Hyperactive bowel sounds  Musculoskeletal: Normal range of motion. She exhibits no edema and no tenderness.  Lymphadenopathy:    She has no cervical adenopathy.  Neurological: She is alert and oriented to person, place, and time. No cranial nerve deficit. She exhibits normal muscle tone. Coordination normal.   Skin: Skin is warm and dry. No rash noted. No erythema. No pallor.  Psychiatric: She has a normal mood and affect. Her behavior is normal. Judgment and thought content normal.    ED Course  Procedures (including critical care time)  Labs Reviewed  CBC WITH DIFFERENTIAL - Abnormal; Notable for the following:    RBC 5.14 (*)     MCV 72.8 (*)     MCH 25.5 (*)     All other components within normal limits  COMPREHENSIVE METABOLIC PANEL - Abnormal; Notable for the following:    Glucose, Bld 113 (*)     Total Protein 8.7 (*)     ALT 47 (*)     GFR calc non Af Amer 73 (*)     GFR calc Af Amer 85 (*)     All other components within normal limits  URINALYSIS, MICROSCOPIC ONLY - Abnormal; Notable for the following:    APPearance CLOUDY (*)     Leukocytes, UA SMALL (*)     Squamous Epithelial / LPF MANY (*)     All other components within normal limits   No results found.   1. Nausea and vomiting       MDM  62 yo female with n/v since this am.  Increased bowel sounds, suspect pt will be developing diarrhea soon.  Abd exam benign.  Precautions given for return.        Olivia Mackie, MD 03/26/12 [phone]

## 2012-03-25 NOTE — ED Notes (Signed)
PO challenged pt and she was able to hold down PO fluids without becoming nauseous.

## 2012-08-07 ENCOUNTER — Encounter (HOSPITAL_COMMUNITY): Payer: Self-pay

## 2012-08-07 DIAGNOSIS — Z8709 Personal history of other diseases of the respiratory system: Secondary | ICD-10-CM | POA: Insufficient documentation

## 2012-08-07 DIAGNOSIS — I10 Essential (primary) hypertension: Secondary | ICD-10-CM | POA: Insufficient documentation

## 2012-08-07 DIAGNOSIS — R609 Edema, unspecified: Secondary | ICD-10-CM | POA: Insufficient documentation

## 2012-08-07 DIAGNOSIS — M25469 Effusion, unspecified knee: Secondary | ICD-10-CM | POA: Insufficient documentation

## 2012-08-07 DIAGNOSIS — Z8719 Personal history of other diseases of the digestive system: Secondary | ICD-10-CM | POA: Insufficient documentation

## 2012-08-07 DIAGNOSIS — Z79899 Other long term (current) drug therapy: Secondary | ICD-10-CM | POA: Insufficient documentation

## 2012-08-07 NOTE — ED Notes (Signed)
Patient presents with left knee swelling and pain since April.  Patient ambulatory in department, denies recent injury.  Knee appears slightly swollen, no open wounds, patient reporting increased pain tonight.

## 2012-08-08 ENCOUNTER — Emergency Department (HOSPITAL_COMMUNITY)
Admit: 2012-08-08 | Discharge: 2012-08-08 | Disposition: A | Discharge status: Home / Self Care | Payer: 59 | Attending: Emergency Medicine | Admitting: Emergency Medicine

## 2012-08-08 ENCOUNTER — Emergency Department (HOSPITAL_COMMUNITY)
Admission: EM | Admit: 2012-08-08 | Discharge: 2012-08-08 | Disposition: A | Discharge status: Home / Self Care | Payer: 59 | Attending: Emergency Medicine | Admitting: Emergency Medicine

## 2012-08-08 DIAGNOSIS — M25462 Effusion, left knee: Secondary | ICD-10-CM

## 2012-08-08 MED ORDER — OXYCODONE-ACETAMINOPHEN 5-325 MG PO TABS: 1.0000 | ORAL_TABLET | ORAL | Status: DC | PRN

## 2012-08-08 MED ORDER — OXYCODONE-ACETAMINOPHEN 5-325 MG PO TABS
1.0000 | ORAL_TABLET | Freq: Once | ORAL | Status: AC
  Administered 2012-08-08: 1 via ORAL
  Filled 2012-08-08: qty 1

## 2012-08-08 NOTE — ED Provider Notes (Signed)
History     CSN: [id number]  Arrival date & time 08/07/12  2300   First MD Initiated Contact with Patient 08/08/12 0222      Chief Complaint  Patient presents with  . Knee Pain   HPI  History provided by the patient. Patient is a 62 year old female with history of hypertension and GERD who presents with continued and worsened left knee pain and swelling. Patient's has had waxing and waning pain and swelling in her left knee since April. Symptoms are worse by the end of the day and with walking and movements. Over the past few days she's had pain in his 2 uncontrolled and severe that she presented to the emergency room tonight. She has been using over-the-counter ibuprofen regularly throughout the day which occasionally helps but not yesterday. She denies having any weakness or numbness in the foot. Any redness of the skin. No fever, chills or sweats. Prior injury or trauma to the knee. No other aggravating or alleviating factors. No other associated symptoms.    Past Medical History  Diagnosis Date  . GERD (gastroesophageal reflux disease)   . Hypertension   . Bronchitis     Past Surgical History  Procedure Laterality Date  . Rotator cuff suregery      No family history on file.  History  Substance Use Topics  . Smoking status: Never Smoker   . Smokeless tobacco: Not on file  . Alcohol Use: Yes    OB History   Grav Para Term Preterm Abortions TAB SAB Ect Mult Living                  Review of Systems  Respiratory: Negative for shortness of breath.   Cardiovascular: Negative for chest pain.  Musculoskeletal:       Knee pain and swelling  Neurological: Negative for weakness and numbness.  All other systems reviewed and are negative.    Allergies  Vicodin  Home Medications   Current Outpatient Rx  Name  Route  Sig  Dispense  Refill  . albuterol (PROVENTIL HFA;VENTOLIN HFA) 108 (90 BASE) MCG/ACT inhaler   Inhalation   Inhale 2 puffs into the lungs daily as  needed. For shortness of breath or wheezing         . atorvastatin (LIPITOR) 10 MG tablet   Oral   Take 10 mg by mouth every evening.          . cetirizine (ZYRTEC) 10 MG tablet   Oral   Take 10 mg by mouth daily.         . famotidine (PEPCID) 20 MG tablet   Oral   Take 1 tablet (20 mg total) by mouth 2 (two) times daily.   30 tablet   0   . ibuprofen (ADVIL,MOTRIN) 200 MG tablet   Oral   Take 200 mg by mouth daily as needed. For pain         . lisinopril (PRINIVIL,ZESTRIL) 30 MG tablet   Oral   Take 30 mg by mouth daily.         . Montelukast Sodium (SINGULAIR PO)   Oral   Take 1 tablet by mouth daily.         . ondansetron (ZOFRAN-ODT) 8 MG disintegrating tablet   Oral   Take 1 tablet (8 mg total) by mouth every 8 (eight) hours as needed for nausea.   20 tablet   0   . ranitidine (ZANTAC) 150 MG capsule   Oral  Take 150 mg by mouth 2 (two) times daily.         Marland Kitchen venlafaxine (EFFEXOR) 100 MG tablet   Oral   Take 100 mg by mouth daily.         . Vitamin D, Ergocalciferol, (DRISDOL) 50000 UNITS CAPS   Oral   Take 50,000 Units by mouth every 7 (seven) days. Take on Monday           BP 115/63  Pulse 74  Temp(Src) 98.3 F (36.8 C) (Oral)  Resp 20  SpO2 98%  Physical Exam  Nursing note and vitals reviewed. Constitutional: She is oriented to person, place, and time. She appears well-developed and well-nourished. No distress.  HENT:  Head: Normocephalic.  Cardiovascular: Normal rate and regular rhythm.   Pulmonary/Chest: Effort normal and breath sounds normal. No respiratory distress. She has no wheezes.  Abdominal: Soft.  Musculoskeletal: She exhibits edema and tenderness.  Reduced range of motion in the left knee secondary to pain. There is moderate swelling with tenderness to palpation. No gross deformities. No significant laxity with valgus or varus stress. Negative anterior posterior drawer test. No distal swelling or tenderness. Normal  dorsal pedal pulses and sensation in foot. Skin normal in color without significant increased warmth.  Neurological: She is alert and oriented to person, place, and time.  Skin: Skin is warm and dry. No rash noted.  Psychiatric: She has a normal mood and affect. Her behavior is normal.    ED Course  Procedures   Dg Knee Complete 4 Views Left  08/08/2012   *RADIOLOGY REPORT*  Clinical Data: Left knee pain and swelling.  LEFT KNEE - COMPLETE 4+ VIEW  Comparison: None  Findings: The joint spaces are maintained.  No acute fracture or joint effusion.  No osteochondral lesion.  IMPRESSION: No acute bony findings or joint effusion.   Original Report Authenticated By: Rudie Meyer, M.D.     1. Knee effusion, left       MDM  Patient seen and evaluated. Patient well-appearing in no acute distress.   Patient has had chronic issues with the knee for the past few months. Does have an upcoming appointment with PCP next month. Provided knee brace and crutches.    Angus Seller, PA-C 08/08/12 2224

## 2012-08-09 NOTE — ED Provider Notes (Signed)
Medical screening examination/treatment/procedure(s) were performed by non-physician practitioner and as supervising physician I was immediately available for consultation/collaboration.   Shemeca Lukasik, MD 08/09/12 1605 

## 2012-09-07 ENCOUNTER — Ambulatory Visit: Payer: 59

## 2012-09-07 ENCOUNTER — Ambulatory Visit: Payer: Managed Care, Other (non HMO) | Attending: Family Medicine | Admitting: Internal Medicine

## 2012-09-07 VITALS — BP 159/75 | HR 70 | Temp 97.9°F | Resp 16 | Ht 64.0 in | Wt 185.6 lb

## 2012-09-07 DIAGNOSIS — E785 Hyperlipidemia, unspecified: Secondary | ICD-10-CM | POA: Insufficient documentation

## 2012-09-07 DIAGNOSIS — Z760 Encounter for issue of repeat prescription: Secondary | ICD-10-CM | POA: Insufficient documentation

## 2012-09-07 DIAGNOSIS — I10 Essential (primary) hypertension: Secondary | ICD-10-CM

## 2012-09-07 MED ORDER — MONTELUKAST SODIUM 10 MG PO TABS: 10.0000 mg | ORAL_TABLET | Freq: Every day | ORAL | Status: DC

## 2012-09-07 MED ORDER — ALBUTEROL SULFATE HFA 108 (90 BASE) MCG/ACT IN AERS: 2.0000 | INHALATION_SPRAY | Freq: Every day | RESPIRATORY_TRACT | Status: DC | PRN

## 2012-09-07 MED ORDER — LISINOPRIL 30 MG PO TABS: 30.0000 mg | ORAL_TABLET | Freq: Every day | ORAL | Status: DC

## 2012-09-07 MED ORDER — CETIRIZINE HCL 10 MG PO TABS: 10.0000 mg | ORAL_TABLET | Freq: Every day | ORAL | Status: AC

## 2012-09-07 MED ORDER — IBUPROFEN 200 MG PO TABS: 400.0000 mg | ORAL_TABLET | Freq: Every day | ORAL | Status: DC | PRN

## 2012-09-07 MED ORDER — ATORVASTATIN CALCIUM 10 MG PO TABS: 10.0000 mg | ORAL_TABLET | Freq: Every evening | ORAL | Status: DC

## 2012-09-07 MED ORDER — VENLAFAXINE HCL ER 150 MG PO CP24: 150.0000 mg | ORAL_CAPSULE | Freq: Every day | ORAL | Status: DC

## 2012-09-07 MED ORDER — VITAMIN D (ERGOCALCIFEROL) 1.25 MG (50000 UNIT) PO CAPS: 50000.0000 [IU] | ORAL_CAPSULE | ORAL | Status: DC

## 2012-09-07 MED ORDER — FAMOTIDINE 20 MG PO TABS: 20.0000 mg | ORAL_TABLET | Freq: Two times a day (BID) | ORAL | Status: DC

## 2012-09-07 NOTE — Patient Instructions (Addendum)

## 2012-09-07 NOTE — Progress Notes (Signed)
PT HERE TO ESTABLISH CARE. HX HTN,DEPRESSION,HYPER CHOLEST,AND KNEE PAIN. RAN OUT OF BP MEDS X 1 MNTH AGO. DENIES CP OR DIZZINESS. VSS. C/O R SHOULDER PAIN DUE TO PINCHED NERVE

## 2012-09-07 NOTE — Progress Notes (Signed)
Patient ID: Alyssa Armstrong, female   DOB: [birth year], 62 y.o.   MRN: [id number]  CC: meds refills   HPI: 62 year old female with past medical history of hypertension, dyslipidemia who presented to the clinic for medication refills. She reports feeling good, no chest pain, no shortness of breath and no palpitations. No abdominal pain, no nausea or vomiting.  Allergies  Allergen Reactions  . Vicodin (Hydrocodone-Acetaminophen) Nausea And Vomiting   Past Medical History  Diagnosis Date  . GERD (gastroesophageal reflux disease)   . Hypertension   . Bronchitis   . Depression 61YRS AGO   Current Outpatient Prescriptions on File Prior to Visit  Medication Sig Dispense Refill  . oxyCODONE-acetaminophen (PERCOCET) 5-325 MG per tablet Take 1 tablet by mouth every 4 (four) hours as needed for pain.  20 tablet  0   No current facility-administered medications on file prior to visit.   Family History  Problem Relation Age of Onset  . Diabetes Mother   . Heart disease Mother   . Diabetes Father   . Cancer Father     LUNG  . Hyperlipidemia Brother    History   Social History  . Marital Status: Single    Spouse Name: N/A    Number of Children: N/A  . Years of Education: N/A   Occupational History  . Not on file.   Social History Main Topics  . Smoking status: Never Smoker   . Smokeless tobacco: Not on file  . Alcohol Use: Yes  . Drug Use: No  . Sexually Active: Yes   Other Topics Concern  . Not on file   Social History Narrative  . No narrative on file    Review of Systems  Constitutional: Negative for fever, chills, diaphoresis, activity change, appetite change and fatigue.  HENT: Negative for ear pain, nosebleeds, congestion, facial swelling, rhinorrhea, neck pain, neck stiffness and ear discharge.   Eyes: Negative for pain, discharge, redness, itching and visual disturbance.  Respiratory: Negative for cough, choking, chest tightness, shortness of breath, wheezing and  stridor.   Cardiovascular: Negative for chest pain, palpitations and leg swelling.  Gastrointestinal: Negative for abdominal distention.  Genitourinary: Negative for dysuria, urgency, frequency, hematuria, flank pain, decreased urine volume, difficulty urinating and dyspareunia.  Musculoskeletal: Negative for back pain, joint swelling, arthralgias and gait problem.  Neurological: Negative for dizziness, tremors, seizures, syncope, facial asymmetry, speech difficulty, weakness, light-headedness, numbness and headaches.  Hematological: Negative for adenopathy. Does not bruise/bleed easily.  Psychiatric/Behavioral: Negative for hallucinations, behavioral problems, confusion, dysphoric mood, decreased concentration and agitation.    Objective:   Filed Vitals:   09/07/12 1307  BP: 159/75  Pulse: 70  Temp: 97.9 F (36.6 C)  Resp: 16    Physical Exam  Constitutional: Appears well-developed and well-nourished. No distress.  HENT: Normocephalic. External right and left ear normal. Oropharynx is clear and moist.  Eyes: Conjunctivae and EOM are normal. PERRLA, no scleral icterus.  Neck: Normal ROM. Neck supple. No JVD. No tracheal deviation. No thyromegaly.  CVS: RRR, S1/S2 +, no murmurs, no gallops, no carotid bruit.  Pulmonary: Effort and breath sounds normal, no stridor, rhonchi, wheezes, rales.  Abdominal: Soft. BS +,  no distension, tenderness, rebound or guarding.  Musculoskeletal: Normal range of motion. No edema and no tenderness.  Lymphadenopathy: No lymphadenopathy noted, cervical, inguinal. Neuro: Alert. Normal reflexes, muscle tone coordination. No cranial nerve deficit. Skin: Skin is warm and dry. No rash noted. Not diaphoretic. No erythema. No pallor.  Psychiatric: Normal mood and affect. Behavior, judgment, thought content normal.   Lab Results  Component Value Date   WBC 7.7 03/25/2012   HGB 13.1 03/25/2012   HCT 37.4 03/25/2012   MCV 72.8* 03/25/2012   PLT 271 03/25/2012   Lab  Results  Component Value Date   CREATININE 0.84 03/25/2012   BUN 12 03/25/2012   NA 141 03/25/2012   K 3.8 03/25/2012   CL 98 03/25/2012   CO2 31 03/25/2012    No results found for this basename: HGBA1C   Lipid Panel  No results found for this basename: chol, trig, hdl, cholhdl, vldl, ldlcalc       Assessment and plan:   Patient Active Problem List   Diagnosis Date Noted  .  Hypertension  - Continue lisinopril 30 mg daily  - We have discussed target BP range - I have advised pt to check BP regularly and to call us back if the numbers are higher than 140/90 - discussed the importance of compliance with medical therapy and diet  - Please note that the patient did not have blood pressure medication this morning and did not take them for which reason her blood pressure may be slightly above the goal  02/28/2008     dyslipidemia  - Patient asked if she can do the blood work on the next visit. Advised the patient to come back in about 2 weeks for followup

## 2012-09-13 ENCOUNTER — Ambulatory Visit: Payer: 59

## 2012-09-20 ENCOUNTER — Ambulatory Visit: Payer: 59

## 2012-10-11 ENCOUNTER — Telehealth: Payer: Self-pay | Admitting: Internal Medicine

## 2012-10-11 NOTE — Telephone Encounter (Signed)
Pharmacy faxed over script for atorvastatin (LIPITOR) 10 MG tablet; Pharmacy does not have any in stock and would like to know if the script can be changed to an alternative; I placed the request in Dr. Louis Meckel physical basket

## 2012-10-12 ENCOUNTER — Other Ambulatory Visit: Payer: Self-pay | Admitting: Internal Medicine

## 2012-10-12 MED ORDER — SIMVASTATIN 20 MG PO TABS: 20.0000 mg | ORAL_TABLET | Freq: Every day | ORAL | Status: DC

## 2012-11-15 ENCOUNTER — Ambulatory Visit (HOSPITAL_COMMUNITY)
Admission: RE | Admit: 2012-11-15 | Discharge: 2012-11-15 | Disposition: A | Discharge status: Home / Self Care | Payer: No Typology Code available for payment source | Source: Ambulatory Visit | Attending: Internal Medicine | Admitting: Internal Medicine

## 2012-11-15 ENCOUNTER — Encounter: Payer: Self-pay | Admitting: Internal Medicine

## 2012-11-15 ENCOUNTER — Ambulatory Visit: Payer: No Typology Code available for payment source | Attending: Internal Medicine | Admitting: Internal Medicine

## 2012-11-15 VITALS — BP 155/79 | HR 80 | Temp 98.2°F | Resp 16 | Ht 65.0 in | Wt 193.0 lb

## 2012-11-15 DIAGNOSIS — R05 Cough: Secondary | ICD-10-CM | POA: Insufficient documentation

## 2012-11-15 DIAGNOSIS — R059 Cough, unspecified: Secondary | ICD-10-CM | POA: Insufficient documentation

## 2012-11-15 MED ORDER — AMLODIPINE BESYLATE 10 MG PO TABS: 10.0000 mg | ORAL_TABLET | Freq: Every day | ORAL | Status: DC

## 2012-11-15 NOTE — Patient Instructions (Addendum)
Lisinopril tablets What is this medicine? LISINOPRIL (lyse IN oh pril) is an ACE inhibitor. This medicine is used to treat high blood pressure and heart failure. It is also used to protect the heart immediately after a heart attack. This medicine may be used for other purposes; ask your health care provider or pharmacist if you have questions. What should I tell my health care provider before I take this medicine? They need to know if you have any of these conditions: -diabetes -heart or blood vessel disease -immune system disease like lupus or scleroderma -kidney disease -low blood pressure -previous swelling of the tongue, face, or lips with difficulty breathing, difficulty swallowing, hoarseness, or tightening of the throat -an unusual or allergic reaction to lisinopril, other ACE inhibitors, insect venom, foods, dyes, or preservatives -pregnant or trying to get pregnant -breast-feeding How should I use this medicine? Take this medicine by mouth with a glass of water. Follow the directions on your prescription label. You may take this medicine with or without food. Take your medicine at regular intervals. Do not stop taking this medicine except on the advice of your doctor or health care professional. Talk to your pediatrician regarding the use of this medicine in children. Special care may be needed. While this drug may be prescribed for children as young as 70 years of age for selected conditions, precautions do apply. Overdosage: If you think you have taken too much of this medicine contact a poison control center or emergency room at once. NOTE: This medicine is only for you. Do not share this medicine with others. What if I miss a dose? If you miss a dose, take it as soon as you can. If it is almost time for your next dose, take only that dose. Do not take double or extra doses. What may interact with this medicine? -diuretics -lithium -NSAIDs, medicines for pain and inflammation,  like ibuprofen or naproxen -over-the-counter herbal supplements like hawthorn -potassium salts or potassium supplements -salt substitutes This list may not describe all possible interactions. Give your health care provider a list of all the medicines, herbs, non-prescription drugs, or dietary supplements you use. Also tell them if you smoke, drink alcohol, or use illegal drugs. Some items may interact with your medicine. What should I watch for while using this medicine? Visit your doctor or health care professional for regular check ups. Check your blood pressure as directed. Ask your doctor what your blood pressure should be, and when you should contact him or her. Call your doctor or health care professional if you notice an irregular or fast heart beat. Women should inform their doctor if they wish to become pregnant or think they might be pregnant. There is a potential for serious side effects to an unborn child. Talk to your health care professional or pharmacist for more information. Check with your doctor or health care professional if you get an attack of severe diarrhea, nausea and vomiting, or if you sweat a lot. The loss of too much body fluid can make it dangerous for you to take this medicine. You may get drowsy or dizzy. Do not drive, use machinery, or do anything that needs mental alertness until you know how this drug affects you. Do not stand or sit up quickly, especially if you are an older patient. This reduces the risk of dizzy or fainting spells. Alcohol can make you more drowsy and dizzy. Avoid alcoholic drinks. Avoid salt substitutes unless you are told otherwise by your doctor or  health care professional. Do not treat yourself for coughs, colds, or pain while you are taking this medicine without asking your doctor or health care professional for advice. Some ingredients may increase your blood pressure. What side effects may I notice from receiving this medicine? Side effects  that you should report to your doctor or health care professional as soon as possible: -abdominal pain with or without nausea or vomiting -allergic reactions like skin rash or hives, swelling of the hands, feet, face, lips, throat, or tongue -dark urine -difficulty breathing -dizzy, lightheaded or fainting spell -fever or sore throat -irregular heart beat, chest pain -pain or difficulty passing urine -redness, blistering, peeling or loosening of the skin, including inside the mouth -unusually weak -yellowing of the eyes or skin Side effects that usually do not require medical attention (report to your doctor or health care professional if they continue or are bothersome): -change in taste -cough -decreased sexual function or desire -headache -sun sensitivity -tiredness This list may not describe all possible side effects. Call your doctor for medical advice about side effects. You may report side effects to FDA at 1-800-FDA-1088. Where should I keep my medicine? Keep out of the reach of children. Store at room temperature between 15 and 30 degrees C (59 and 86 degrees F). Protect from moisture. Keep container tightly closed. Throw away any unused medicine after the expiration date. NOTE: This sheet is a summary. It may not cover all possible information. If you have questions about this medicine, talk to your doctor, pharmacist, or health care provider.  2012, [employer]. (08/09/2007 5:36:32 PM)

## 2012-11-15 NOTE — Progress Notes (Signed)
Pt is here as a walk in. Pt reports having a persistent cough . Since Aug, 25th. She is not spitting up any mucus, hard to breath, chest and throat hurt and wakes up in the middle of the night coughing gasping for air.

## 2012-11-15 NOTE — Progress Notes (Signed)
Patient ID: Alyssa Armstrong, female   DOB: [birth year], 62 y.o.   MRN: [id number]  CC: Cough  HPI: Patient is 62 year old female who presents to clinic with main concern of several months duration of nonproductive cough, no specific alleviating or aggravating factors, no fevers and chills, no shortness of breath and no chest pain. She's not sure what's causing it and at one point she was told that she's got chronic bronchitis. She denies any sputum production and explains that he does not feel like her typical bronchitis flare. She denies weight loss or weight gain, no changes in weight, no night sweats.  Allergies  Allergen Reactions  . Vicodin [Hydrocodone-Acetaminophen] Nausea And Vomiting   Past Medical History  Diagnosis Date  . GERD (gastroesophageal reflux disease)   . Hypertension   . Bronchitis   . Depression 72YRS AGO   Current Outpatient Prescriptions on File Prior to Visit  Medication Sig Dispense Refill  . albuterol (PROVENTIL HFA;VENTOLIN HFA) 108 (90 BASE) MCG/ACT inhaler Inhale 2 puffs into the lungs daily as needed for wheezing or shortness of breath. For shortness of breath or wheezing  1 Inhaler  11  . cetirizine (ZYRTEC) 10 MG tablet Take 1 tablet (10 mg total) by mouth daily.  30 tablet  3  . famotidine (PEPCID) 20 MG tablet Take 1 tablet (20 mg total) by mouth 2 (two) times daily.  60 tablet  3  . ibuprofen (ADVIL,MOTRIN) 200 MG tablet Take 2 tablets (400 mg total) by mouth daily as needed for headache. For pain  30 tablet  0  . montelukast (SINGULAIR) 10 MG tablet Take 1 tablet (10 mg total) by mouth at bedtime.  30 tablet  0  . simvastatin (ZOCOR) 20 MG tablet Take 1 tablet (20 mg total) by mouth at bedtime.  90 tablet  3  . venlafaxine XR (EFFEXOR-XR) 150 MG 24 hr capsule Take 1 capsule (150 mg total) by mouth daily.  30 capsule  3  . Vitamin D, Ergocalciferol, (DRISDOL) 50000 UNITS CAPS Take 1 capsule (50,000 Units total) by mouth every 7 (seven) days. Take on Monday   12 capsule  3  . oxyCODONE-acetaminophen (PERCOCET) 5-325 MG per tablet Take 1 tablet by mouth every 4 (four) hours as needed for pain.  20 tablet  0   No current facility-administered medications on file prior to visit.   Family History  Problem Relation Age of Onset  . Diabetes Mother   . Heart disease Mother   . Diabetes Father   . Cancer Father     LUNG  . Hyperlipidemia Brother    History   Social History  . Marital Status: Single    Spouse Name: N/A    Number of Children: N/A  . Years of Education: N/A   Occupational History  . Not on file.   Social History Main Topics  . Smoking status: Never Smoker   . Smokeless tobacco: Not on file  . Alcohol Use: Yes  . Drug Use: No  . Sexual Activity: Yes   Other Topics Concern  . Not on file   Social History Narrative  . No narrative on file    Review of Systems  Constitutional: Negative for fever, chills, diaphoresis, activity change, appetite change and fatigue.  HENT: Negative for ear pain, nosebleeds, congestion, facial swelling, rhinorrhea, neck pain, neck stiffness and ear discharge.   Eyes: Negative for pain, discharge, redness, itching and visual disturbance.  Respiratory: Negative for choking, chest tightness, shortness of breath,  wheezing and stridor.   Cardiovascular: Negative for chest pain, palpitations and leg swelling.  Gastrointestinal: Negative for abdominal distention.  Genitourinary: Negative for dysuria, urgency, frequency, hematuria, flank pain, decreased urine volume, difficulty urinating and dyspareunia.  Musculoskeletal: Negative for back pain, joint swelling, arthralgias and gait problem.  Neurological: Negative for dizziness, tremors, seizures, syncope, facial asymmetry, speech difficulty, weakness, light-headedness, numbness and headaches.  Hematological: Negative for adenopathy. Does not bruise/bleed easily.  Psychiatric/Behavioral: Negative for hallucinations, behavioral problems, confusion,  dysphoric mood, decreased concentration and agitation.    Objective:   Filed Vitals:   11/15/12 1134  BP: 155/79  Pulse: 80  Temp: 98.2 F (36.8 C)  Resp: 16    Physical Exam  Constitutional: Appears well-developed and well-nourished. No distress.  HENT: Normocephalic. External right and left ear normal. Oropharynx is clear and moist.  Eyes: Conjunctivae and EOM are normal. PERRLA, no scleral icterus.  Neck: Normal ROM. Neck supple. No JVD. No tracheal deviation. No thyromegaly.  CVS: RRR, S1/S2 +, no murmurs, no gallops, no carotid bruit.  Pulmonary: Effort and breath sounds normal, no stridor, rhonchi, wheezes, rales.  Abdominal: Soft. BS +,  no distension, tenderness, rebound or guarding.    Lab Results  Component Value Date   WBC 7.7 03/25/2012   HGB 13.1 03/25/2012   HCT 37.4 03/25/2012   MCV 72.8* 03/25/2012   PLT 271 03/25/2012   Lab Results  Component Value Date   CREATININE 0.84 03/25/2012   BUN 12 03/25/2012   NA 141 03/25/2012   K 3.8 03/25/2012   CL 98 03/25/2012   CO2 31 03/25/2012    No results found for this basename: HGBA1C   Lipid Panel  No results found for this basename: chol, trig, hdl, cholhdl, vldl, ldlcalc       Assessment and plan:   Patient Active Problem List   Diagnosis Date Noted  . HTN (hypertension) - since we will stop lisinopril due to cough, I will add Norvasc 10 mg tablet once daily. Patient advised to check blood pressure regularly and to call his back in the numbers are persistently higher than 140/90 so that we can readjust the medical regimen.  09/07/2012  .  cough  - unclear etiology and possibly related to ACE inhibitor. We have discussed side effects of lisinopril and decision was made today but we'll stop lisinopril and market as intolerance due to cough. I will also obtain chest x-ray for further analysis. I have explained to patient if the cough persists after we discontinue lisinopril we may need to increase the dose of Pepcid for acid  reflux control  02/28/2008

## 2012-11-18 ENCOUNTER — Other Ambulatory Visit: Payer: Self-pay | Admitting: Family Medicine

## 2012-11-18 MED ORDER — ALBUTEROL SULFATE HFA 108 (90 BASE) MCG/ACT IN AERS: 2.0000 | INHALATION_SPRAY | Freq: Every day | RESPIRATORY_TRACT | Status: DC | PRN

## 2012-11-22 ENCOUNTER — Ambulatory Visit: Payer: No Typology Code available for payment source | Attending: Internal Medicine

## 2012-11-24 ENCOUNTER — Telehealth: Payer: Self-pay

## 2012-11-24 NOTE — Telephone Encounter (Signed)
Dawn from health dept called to verify albuterol inhaler Should be 2 puffs every 4-6 hours PRN Per Dr Hyman Hopes

## 2013-01-17 ENCOUNTER — Encounter (HOSPITAL_COMMUNITY): Payer: Self-pay | Admitting: Emergency Medicine

## 2013-01-17 ENCOUNTER — Emergency Department (HOSPITAL_COMMUNITY)
Admission: EM | Admit: 2013-01-17 | Discharge: 2013-01-17 | Disposition: A | Discharge status: Home / Self Care | Payer: No Typology Code available for payment source | Source: Home / Self Care | Attending: Emergency Medicine | Admitting: Emergency Medicine

## 2013-01-17 DIAGNOSIS — A084 Viral intestinal infection, unspecified: Secondary | ICD-10-CM

## 2013-01-17 DIAGNOSIS — A088 Other specified intestinal infections: Secondary | ICD-10-CM

## 2013-01-17 LAB — CBC WITH DIFFERENTIAL/PLATELET
Eosinophils Absolute: 0.1 10*3/uL (ref 0.0–0.7)
Eosinophils Relative: 1 % (ref 0–5)
Lymphocytes Relative: 48 % — ABNORMAL HIGH (ref 12–46)
MCH: 25.7 pg — ABNORMAL LOW (ref 26.0–34.0)
Monocytes Absolute: 0.6 10*3/uL (ref 0.1–1.0)
Monocytes Relative: 9 % (ref 3–12)
Neutrophils Relative %: 42 % — ABNORMAL LOW (ref 43–77)
Platelets: 288 10*3/uL (ref 150–400)
RBC: 5.71 MIL/uL — ABNORMAL HIGH (ref 3.87–5.11)
RDW: 15 % (ref 11.5–15.5)
WBC: 7 10*3/uL (ref 4.0–10.5)

## 2013-01-17 LAB — POCT I-STAT, CHEM 8
Calcium, Ion: 1.24 mmol/L (ref 1.13–1.30)
Glucose, Bld: 120 mg/dL — ABNORMAL HIGH (ref 70–99)
HCT: 49 % — ABNORMAL HIGH (ref 36.0–46.0)
Hemoglobin: 16.7 g/dL — ABNORMAL HIGH (ref 12.0–15.0)
Potassium: 3.7 mEq/L (ref 3.5–5.1)

## 2013-01-17 MED ORDER — GI COCKTAIL ~~LOC~~
30.0000 mL | Freq: Once | ORAL | Status: AC
  Administered 2013-01-17: 30 mL via ORAL

## 2013-01-17 MED ORDER — ONDANSETRON 8 MG PO TBDP: 8.0000 mg | ORAL_TABLET | Freq: Three times a day (TID) | ORAL | Status: DC | PRN

## 2013-01-17 MED ORDER — ONDANSETRON 4 MG PO TBDP
ORAL_TABLET | ORAL | Status: AC
  Filled 2013-01-17: qty 2

## 2013-01-17 MED ORDER — SODIUM CHLORIDE 0.9 % IV SOLN
INTRAVENOUS | Status: DC
  Administered 2013-01-17: 18:00:00 via INTRAVENOUS

## 2013-01-17 MED ORDER — GI COCKTAIL ~~LOC~~
ORAL | Status: AC
  Filled 2013-01-17: qty 30

## 2013-01-17 MED ORDER — ONDANSETRON 4 MG PO TBDP
8.0000 mg | ORAL_TABLET | Freq: Once | ORAL | Status: AC
  Administered 2013-01-17: 8 mg via ORAL

## 2013-01-17 NOTE — ED Notes (Signed)
C/o vomiting since 7 pm last night. States she did have some chills and fever States she does have drainage down throat and bilateral ear pain

## 2013-01-17 NOTE — ED Provider Notes (Signed)
Chief Complaint:   Chief Complaint  Patient presents with  . Emesis    History of Present Illness:   Alyssa Armstrong is a 62 year old female who has had a history since 7 PM yesterday of nausea and vomiting of clear vomitus. She vomited about 5 or 6 times yesterday and 3 times today. There was no blood in the vomitus, no coffee-ground emesis or bilious emesis. She had some pain in her upper abdomen, felt hot and cold, and sweating. She feels weak, tired, and dizzy. She has had no diarrhea, blood in the stool, or melena. She denies any URI symptoms. Her son had a similar nausea and vomiting illness just recently. She denies any suspicious ingestions, foreign travel, or animal exposure.  Review of Systems:  Other than noted above, the patient denies any of the following symptoms: Systemic:  No fevers, chills, sweats, weight loss or gain, fatigue, or tiredness. ENT:  No nasal congestion, rhinorrhea, or sore throat. Lungs:  No cough, wheezing, or shortness of breath. Cardiac:  No chest pain, syncope, or presyncope. GI:  No abdominal pain, nausea, vomiting, anorexia, diarrhea, constipation, blood in stool or vomitus. GU:  No dysuria, frequency, or urgency.  PMFSH:  Past medical history, family history, social history, meds, and allergies were reviewed.  She is allergic to Vicodin. She takes a number of medications including Effexor XR, simvastatin, amlodipine, Singulair, albuterol, and famotidine. She has hypertension, hypercholesterolemia, asthma, and gastroesophageal reflux.  Physical Exam:   Vital signs:  BP 142/83  Pulse 103  Temp(Src) 98.2 F (36.8 C) (Oral)  Resp 16  SpO2 100% Filed Vitals:   01/17/13 1707 Supine  01/17/13 1709 Sitting  01/17/13 1710 Standing   BP: 160/71 150/71 142/83  Pulse: 72 86 103  Temp: 98.2 F (36.8 C)    TempSrc: Oral    Resp: 16    SpO2: 100%     General:  Alert and oriented.  In no distress.  Skin warm and dry.  Good skin turgor, brisk capillary  refill. ENT:  No scleral icterus, moist mucous membranes, no oral lesions, pharynx clear. Lungs:  Breath sounds clear and equal bilaterally.  No wheezes, rales, or rhonchi. Heart:  Rhythm regular, without extrasystoles.  No gallops or murmers. Abdomen:  She has mild tenderness to palpation across the entire upper abdomen without guarding or rebound. No organomegaly or mass. Bowel sounds are hyperactive and continuous. Skin: Clear, warm, and dry.  Good turgor.  Brisk capillary refill.  Labs:   Results for orders placed during the hospital encounter of 01/17/13  CBC WITH DIFFERENTIAL      Result Value Range   WBC 7.0  4.0 - 10.5 K/uL   RBC 5.71 (*) 3.87 - 5.11 MIL/uL   Hemoglobin 14.7  12.0 - 15.0 g/dL   HCT 16.1  09.6 - 04.5 %   MCV 72.7 (*) 78.0 - 100.0 fL   MCH 25.7 (*) 26.0 - 34.0 pg   MCHC 35.4  30.0 - 36.0 g/dL   RDW 40.9  81.1 - 91.4 %   Platelets 288  150 - 400 K/uL   Neutrophils Relative % 42 (*) 43 - 77 %   Lymphocytes Relative 48 (*) 12 - 46 %   Monocytes Relative 9  3 - 12 %   Eosinophils Relative 1  0 - 5 %   Basophils Relative 0  0 - 1 %   Neutro Abs 2.9  1.7 - 7.7 K/uL   Lymphs Abs 3.4  0.7 -  4.0 K/uL   Monocytes Absolute 0.6  0.1 - 1.0 K/uL   Eosinophils Absolute 0.1  0.0 - 0.7 K/uL   Basophils Absolute 0.0  0.0 - 0.1 K/uL   RBC Morphology POLYCHROMASIA PRESENT     WBC Morphology ATYPICAL LYMPHOCYTES     Smear Review LARGE PLATELETS PRESENT    POCT I-STAT, CHEM 8      Result Value Range   Sodium 142  135 - 145 mEq/L   Potassium 3.7  3.5 - 5.1 mEq/L   Chloride 104  96 - 112 mEq/L   BUN 11  6 - 23 mg/dL   Creatinine, Ser 8.46  0.50 - 1.10 mg/dL   Glucose, Bld 962 (*) 70 - 99 mg/dL   Calcium, Ion 9.52  8.41 - 1.30 mmol/L   TCO2 24  0 - 100 mmol/L   Hemoglobin 16.7 (*) 12.0 - 15.0 g/dL   HCT 32.4 (*) 40.1 - 02.7 %     Course in Urgent Care Center:   She was given normal saline, 1 L and Zofran ODT 8 mg sublingually, followed by GI cocktail 30 cc. Thereafter she  felt a lot better.  Assessment:  The encounter diagnosis was Viral gastroenteritis.  Plan:   1.  Meds:  The following meds were prescribed:   Discharge Medication List as of 01/17/2013  7:18 PM    START taking these medications   Details  ondansetron (ZOFRAN ODT) 8 MG disintegrating tablet Take 1 tablet (8 mg total) by mouth every 8 (eight) hours as needed for nausea., Starting 01/17/2013, Until Discontinued, Normal        2.  Patient Education/Counseling:  The patient was given appropriate handouts, self care instructions, and instructed in symptomatic relief. The patient was told to stay on clear liquids for the remainder of the day, then advance to a B.R.A.T. diet starting tomorrow.   3.  Follow up:  The patient was told to follow up if no better in 3 to 4 days, if becoming worse in any way, and given some red flag symptoms such as persistent vomiting, fever, or worsening abdominal pain which would prompt immediate return.  Follow up here or at the emergency department as needed.       Reuben Likes, MD 01/17/13 2245

## 2013-03-25 ENCOUNTER — Other Ambulatory Visit: Payer: Self-pay | Admitting: Internal Medicine

## 2013-04-18 ENCOUNTER — Other Ambulatory Visit: Payer: Self-pay

## 2013-04-18 DIAGNOSIS — Z1231 Encounter for screening mammogram for malignant neoplasm of breast: Secondary | ICD-10-CM

## 2013-04-20 ENCOUNTER — Ambulatory Visit
Admission: RE | Admit: 2013-04-20 | Discharge: 2013-04-20 | Disposition: A | Discharge status: Home / Self Care | Payer: BC Managed Care – PPO | Source: Ambulatory Visit

## 2013-04-20 DIAGNOSIS — Z1231 Encounter for screening mammogram for malignant neoplasm of breast: Secondary | ICD-10-CM

## 2013-05-07 ENCOUNTER — Emergency Department (HOSPITAL_COMMUNITY)
Admission: EM | Admit: 2013-05-07 | Discharge: 2013-05-07 | Disposition: A | Discharge status: Home / Self Care | Payer: BC Managed Care – PPO | Attending: Emergency Medicine | Admitting: Emergency Medicine

## 2013-05-07 ENCOUNTER — Emergency Department (HOSPITAL_COMMUNITY): Payer: BC Managed Care – PPO

## 2013-05-07 ENCOUNTER — Encounter (HOSPITAL_COMMUNITY): Payer: Self-pay | Admitting: Emergency Medicine

## 2013-05-07 DIAGNOSIS — Z90710 Acquired absence of both cervix and uterus: Secondary | ICD-10-CM | POA: Insufficient documentation

## 2013-05-07 DIAGNOSIS — Z8709 Personal history of other diseases of the respiratory system: Secondary | ICD-10-CM | POA: Insufficient documentation

## 2013-05-07 DIAGNOSIS — Z79899 Other long term (current) drug therapy: Secondary | ICD-10-CM | POA: Insufficient documentation

## 2013-05-07 DIAGNOSIS — F329 Major depressive disorder, single episode, unspecified: Secondary | ICD-10-CM | POA: Insufficient documentation

## 2013-05-07 DIAGNOSIS — K219 Gastro-esophageal reflux disease without esophagitis: Secondary | ICD-10-CM | POA: Insufficient documentation

## 2013-05-07 DIAGNOSIS — R109 Unspecified abdominal pain: Secondary | ICD-10-CM | POA: Insufficient documentation

## 2013-05-07 DIAGNOSIS — I10 Essential (primary) hypertension: Secondary | ICD-10-CM | POA: Insufficient documentation

## 2013-05-07 DIAGNOSIS — F3289 Other specified depressive episodes: Secondary | ICD-10-CM | POA: Insufficient documentation

## 2013-05-07 LAB — COMPREHENSIVE METABOLIC PANEL
ALT: 38 U/L — AB (ref 0–35)
AST: 43 U/L — ABNORMAL HIGH (ref 0–37)
Albumin: 3.8 g/dL (ref 3.5–5.2)
Alkaline Phosphatase: 115 U/L (ref 39–117)
BUN: 17 mg/dL (ref 6–23)
CALCIUM: 9.6 mg/dL (ref 8.4–10.5)
CO2: 27 mEq/L (ref 19–32)
Chloride: 101 mEq/L (ref 96–112)
Creatinine, Ser: 0.82 mg/dL (ref 0.50–1.10)
GFR calc non Af Amer: 75 mL/min — ABNORMAL LOW (ref >90)
GFR, EST AFRICAN AMERICAN: 86 mL/min — AB (ref >90)
GLUCOSE: 134 mg/dL — AB (ref 70–99)
Potassium: 4.3 mEq/L (ref 3.7–5.3)
SODIUM: 141 meq/L (ref 137–147)
Total Bilirubin: <0.2 mg/dL — ABNORMAL LOW (ref 0.3–1.2)
Total Protein: 7.5 g/dL (ref 6.0–8.3)

## 2013-05-07 LAB — CBC WITH DIFFERENTIAL/PLATELET
Basophils Absolute: 0 10*3/uL (ref 0.0–0.1)
Basophils Relative: 1 % (ref 0–1)
EOS ABS: 0.2 10*3/uL (ref 0.0–0.7)
EOS PCT: 3 % (ref 0–5)
HCT: 38.4 % (ref 36.0–46.0)
Hemoglobin: 13.3 g/dL (ref 12.0–15.0)
LYMPHS ABS: 4.7 10*3/uL — AB (ref 0.7–4.0)
LYMPHS PCT: 54 % — AB (ref 12–46)
MCH: 25.9 pg — AB (ref 26.0–34.0)
MCHC: 34.6 g/dL (ref 30.0–36.0)
MCV: 74.9 fL — AB (ref 78.0–100.0)
MONO ABS: 0.9 10*3/uL (ref 0.1–1.0)
Monocytes Relative: 10 % (ref 3–12)
Neutro Abs: 2.8 10*3/uL (ref 1.7–7.7)
Neutrophils Relative %: 32 % — ABNORMAL LOW (ref 43–77)
PLATELETS: 294 10*3/uL (ref 150–400)
RBC: 5.13 MIL/uL — ABNORMAL HIGH (ref 3.87–5.11)
RDW: 15.2 % (ref 11.5–15.5)
WBC: 8.6 10*3/uL (ref 4.0–10.5)

## 2013-05-07 LAB — URINALYSIS, ROUTINE W REFLEX MICROSCOPIC
Bilirubin Urine: NEGATIVE
Glucose, UA: NEGATIVE mg/dL
HGB URINE DIPSTICK: NEGATIVE
Ketones, ur: NEGATIVE mg/dL
Leukocytes, UA: NEGATIVE
Nitrite: NEGATIVE
PROTEIN: NEGATIVE mg/dL
Specific Gravity, Urine: 1.015 (ref 1.005–1.030)
UROBILINOGEN UA: 0.2 mg/dL (ref 0.0–1.0)
pH: 7 (ref 5.0–8.0)

## 2013-05-07 LAB — LIPASE, BLOOD: LIPASE: 25 U/L (ref 11–59)

## 2013-05-07 MED ORDER — ONDANSETRON HCL 4 MG PO TABS: 4.0000 mg | ORAL_TABLET | Freq: Three times a day (TID) | ORAL | Status: DC | PRN

## 2013-05-07 MED ORDER — MORPHINE SULFATE 4 MG/ML IJ SOLN
4.0000 mg | Freq: Once | INTRAMUSCULAR | Status: AC
  Administered 2013-05-07: 4 mg via INTRAVENOUS
  Filled 2013-05-07: qty 1

## 2013-05-07 MED ORDER — SODIUM CHLORIDE 0.9 % IV BOLUS (SEPSIS)
1000.0000 mL | Freq: Once | INTRAVENOUS | Status: AC
  Administered 2013-05-07: 1000 mL via INTRAVENOUS

## 2013-05-07 MED ORDER — ONDANSETRON HCL 4 MG/2ML IJ SOLN
4.0000 mg | Freq: Once | INTRAMUSCULAR | Status: AC
  Administered 2013-05-07: 4 mg via INTRAVENOUS
  Filled 2013-05-07: qty 2

## 2013-05-07 MED ORDER — IOHEXOL 300 MG/ML  SOLN
100.0000 mL | Freq: Once | INTRAMUSCULAR | Status: AC | PRN
  Administered 2013-05-07: 100 mL via INTRAVENOUS

## 2013-05-07 MED ORDER — IOHEXOL 300 MG/ML  SOLN
25.0000 mL | INTRAMUSCULAR | Status: AC | PRN
  Administered 2013-05-07 (×2): 25 mL via ORAL

## 2013-05-07 NOTE — ED Notes (Signed)
Pt returned from xray

## 2013-05-07 NOTE — ED Notes (Signed)
CT notified Pt completed oral contrast.

## 2013-05-07 NOTE — Discharge Instructions (Signed)
Abdominal Pain, Adult Many things can cause abdominal pain. Usually, abdominal pain is not caused by a disease and will improve without treatment. It can often be observed and treated at home. Your health care provider will do a physical exam and possibly order blood tests and X-rays to help determine the seriousness of your pain. However, in many cases, more time must pass before a clear cause of the pain can be found. Before that point, your health care provider may not know if you need more testing or further treatment. HOME CARE INSTRUCTIONS  Monitor your abdominal pain for any changes. The following actions may help to alleviate any discomfort you are experiencing:  Only take over-the-counter or prescription medicines as directed by your health care provider.  Do not take laxatives unless directed to do so by your health care provider.  Try a clear liquid diet (broth, tea, or water) as directed by your health care provider. Slowly move to a bland diet as tolerated. SEEK MEDICAL CARE IF:  You have unexplained abdominal pain.  You have abdominal pain associated with nausea or diarrhea.  You have pain when you urinate or have a bowel movement.  You experience abdominal pain that wakes you in the night.  You have abdominal pain that is worsened or improved by eating food.  You have abdominal pain that is worsened with eating fatty foods. SEEK IMMEDIATE MEDICAL CARE IF:   Your pain does not go away within 2 hours.  You have a fever.  You keep throwing up (vomiting).  Your pain is felt only in portions of the abdomen, such as the right side or the left lower portion of the abdomen.  You pass bloody or black tarry stools. MAKE SURE YOU:  Understand these instructions.   Will watch your condition.   Will get help right away if you are not doing well or get worse.  Document Released: 11/13/2004 Document Revised: 11/24/2012 Document Reviewed: 10/13/2012 [facility] Patient  Information 2014 [employer].    Zofran if needed for nausea Rest Oral hydration Normal abdomen CT Follow-up with your MD on Monday

## 2013-05-07 NOTE — ED Notes (Signed)
Gilford Rile, NP at bedside

## 2013-05-07 NOTE — ED Provider Notes (Signed)
CSN: [id number]     Arrival date & time 05/07/13  0973 History   First MD Initiated Contact with Patient 05/07/13 0631     Chief Complaint  Patient presents with  . Abdominal Pain     (Consider location/radiation/quality/duration/timing/severity/associated sxs/prior Treatment) Patient is a 63 y.o. female presenting with abdominal pain. The history is provided by the patient. No language interpreter was used.  Abdominal Pain Pain location:  RUQ and RLQ Pain quality: aching   Pain radiates to:  Does not radiate Duration:  3 days Timing:  Constant Associated symptoms: nausea and vomiting   Associated symptoms: no chills, no fever, no hematemesis, no hematochezia and no shortness of breath   Pt is a 63 year old female who presents with abdominal pain that she reports has been ongoing for the last three days. She reports that she has had right sided abdominal pain that increases to a sharp pain when she walks. She reports that she had some episodic vomiting yesterday and has had a decrease in her appetite. She denies fever, chills, diarrhea or exposure to illness. No recent abdominal surgeries and she has been having normal bm's. No reports of dysuria.   Past Medical History  Diagnosis Date  . GERD (gastroesophageal reflux disease)   . Hypertension   . Bronchitis   . Depression 19YRS AGO   Past Surgical History  Procedure Laterality Date  . Rotator cuff suregery    . Abdominal hysterectomy      PARTIAL  . Bunionectomy  2009  . Rotator cuff repair  2008   Family History  Problem Relation Age of Onset  . Diabetes Mother   . Heart disease Mother   . Diabetes Father   . Cancer Father     LUNG  . Hyperlipidemia Brother    History  Substance Use Topics  . Smoking status: Never Smoker   . Smokeless tobacco: Not on file  . Alcohol Use: Yes   OB History   Grav Para Term Preterm Abortions TAB SAB Ect Mult Living                 Review of Systems  Constitutional: Negative for  fever and chills.  Respiratory: Negative for shortness of breath.   Gastrointestinal: Positive for nausea, vomiting and abdominal pain. Negative for hematochezia and hematemesis.      Allergies  Lisinopril and Vicodin  Home Medications   Current Outpatient Rx  Name  Route  Sig  Dispense  Refill  . albuterol (PROVENTIL HFA;VENTOLIN HFA) 108 (90 BASE) MCG/ACT inhaler   Inhalation   Inhale 2 puffs into the lungs daily as needed for wheezing or shortness of breath. For shortness of breath or wheezing   1 Inhaler   11   . amLODipine (NORVASC) 10 MG tablet   Oral   Take 1 tablet (10 mg total) by mouth daily.   90 tablet   3   . cetirizine (ZYRTEC) 10 MG tablet   Oral   Take 1 tablet (10 mg total) by mouth daily.   30 tablet   3   . famotidine (PEPCID) 20 MG tablet      TAKE 1 TABLET BY MOUTH TWICE DAILY   60 tablet   0     No refills available   . ibuprofen (ADVIL,MOTRIN) 200 MG tablet   Oral   Take 2 tablets (400 mg total) by mouth daily as needed for headache. For pain   30 tablet   0   .  montelukast (SINGULAIR) 10 MG tablet   Oral   Take 1 tablet (10 mg total) by mouth at bedtime.   30 tablet   0   . ondansetron (ZOFRAN ODT) 8 MG disintegrating tablet   Oral   Take 1 tablet (8 mg total) by mouth every 8 (eight) hours as needed for nausea.   20 tablet   0   . oxyCODONE-acetaminophen (PERCOCET) 5-325 MG per tablet   Oral   Take 1 tablet by mouth every 4 (four) hours as needed for pain.   20 tablet   0   . simvastatin (ZOCOR) 20 MG tablet   Oral   Take 1 tablet (20 mg total) by mouth at bedtime.   90 tablet   3   . venlafaxine XR (EFFEXOR-XR) 150 MG 24 hr capsule   Oral   Take 1 capsule (150 mg total) by mouth daily.   30 capsule   3   . Vitamin D, Ergocalciferol, (DRISDOL) 50000 UNITS CAPS   Oral   Take 1 capsule (50,000 Units total) by mouth every 7 (seven) days. Take on Monday   12 capsule   3    BP 154/85  Pulse 98  Temp(Src) 98.5 F  (36.9 C) (Oral)  Resp 16  Ht 5\' 4"  (1.626 m)  Wt 194 lb 8 oz (88.225 kg)  BMI 33.37 kg/m2  SpO2 99% Physical Exam  Nursing note and vitals reviewed. Constitutional: She is oriented to person, place, and time. She appears well-developed and well-nourished. No distress.  HENT:  Head: Normocephalic and atraumatic.  Eyes: Conjunctivae and EOM are normal. Pupils are equal, round, and reactive to light.  Neck: Normal range of motion. Neck supple. No JVD present. No tracheal deviation present. No thyromegaly present.  Cardiovascular: Normal rate, regular rhythm and normal heart sounds.   Pulmonary/Chest: Effort normal and breath sounds normal. No respiratory distress. She has no wheezes.  Abdominal: Soft. Normal appearance and bowel sounds are normal. There is tenderness in the right upper quadrant and right lower quadrant. There is no rigidity, no rebound, no guarding, no CVA tenderness and no tenderness at McBurney's point.  Musculoskeletal: Normal range of motion.  Lymphadenopathy:    She has no cervical adenopathy.  Neurological: She is alert and oriented to person, place, and time. Coordination normal.  Skin: Skin is warm and dry. No rash noted. No pallor.  Psychiatric: She has a normal mood and affect. Her behavior is normal. Judgment and thought content normal.    ED Course  Procedures (including critical care time) Labs Review Labs Reviewed  CBC WITH DIFFERENTIAL - Abnormal; Notable for the following:    RBC 5.13 (*)    MCV 74.9 (*)    MCH 25.9 (*)    Neutrophils Relative % 32 (*)    Lymphocytes Relative 54 (*)    Lymphs Abs 4.7 (*)    All other components within normal limits  COMPREHENSIVE METABOLIC PANEL - Abnormal; Notable for the following:    Glucose, Bld 134 (*)    AST 43 (*)    ALT 38 (*)    Total Bilirubin <0.2 (*)    GFR calc non Af Amer 75 (*)    GFR calc Af Amer 86 (*)    All other components within normal limits  LIPASE, BLOOD   Imaging Review No results  found.   EKG Interpretation None      MDM   Final diagnoses:  Pain, abdominal, nonspecific    Afebrile. No  leukocytosis. Patient reports episodic vomiting yesterday. Zofran given here in ER and pt feeling better. Urinalysis negative. Lipase negative. CBC, BMP unremarkable. CT abdomen/pelvis; No evidence on CT to explain her c/o RUQ pain. Discussed plan with pt, follow-up with PCP. Return precautions given and prescriptions for zofran given.        Elisha Headland, NP 05/12/13 2200

## 2013-05-07 NOTE — ED Notes (Signed)
Pt states she has been having lower right abd/pelvic pain.  Pt states pain started three days prior, with vomiting starting yesterday.

## 2013-05-07 NOTE — ED Notes (Signed)
Pt reports RLQ abd pain x 3 days. Pt reports that the pain is sharp when moving and walking. Pt reports the pain is a throbbing when sitting still.

## 2013-05-07 NOTE — ED Notes (Signed)
Patient returned to bed, monitor reapplied

## 2013-05-09 DIAGNOSIS — N951 Menopausal and female climacteric states: Secondary | ICD-10-CM | POA: Insufficient documentation

## 2013-05-09 DIAGNOSIS — E1169 Type 2 diabetes mellitus with other specified complication: Secondary | ICD-10-CM | POA: Insufficient documentation

## 2013-05-15 NOTE — ED Provider Notes (Signed)
Medical screening examination/treatment/procedure(s) were performed by non-physician practitioner and as supervising physician I was immediately available for consultation/collaboration.   EKG Interpretation None        Alfonzo Feller, DO 05/15/13 1456

## 2013-09-13 ENCOUNTER — Encounter (HOSPITAL_COMMUNITY): Payer: Self-pay | Admitting: Emergency Medicine

## 2013-09-13 ENCOUNTER — Emergency Department (HOSPITAL_COMMUNITY)
Admission: EM | Admit: 2013-09-13 | Discharge: 2013-09-13 | Disposition: A | Discharge status: Home / Self Care | Payer: Self-pay | Attending: Emergency Medicine | Admitting: Emergency Medicine

## 2013-09-13 ENCOUNTER — Emergency Department (HOSPITAL_COMMUNITY): Payer: BC Managed Care – PPO

## 2013-09-13 DIAGNOSIS — Z8709 Personal history of other diseases of the respiratory system: Secondary | ICD-10-CM | POA: Insufficient documentation

## 2013-09-13 DIAGNOSIS — Z79899 Other long term (current) drug therapy: Secondary | ICD-10-CM | POA: Insufficient documentation

## 2013-09-13 DIAGNOSIS — R143 Flatulence: Secondary | ICD-10-CM

## 2013-09-13 DIAGNOSIS — Z8659 Personal history of other mental and behavioral disorders: Secondary | ICD-10-CM | POA: Insufficient documentation

## 2013-09-13 DIAGNOSIS — R141 Gas pain: Secondary | ICD-10-CM | POA: Insufficient documentation

## 2013-09-13 DIAGNOSIS — M25512 Pain in left shoulder: Secondary | ICD-10-CM

## 2013-09-13 DIAGNOSIS — Z8719 Personal history of other diseases of the digestive system: Secondary | ICD-10-CM | POA: Insufficient documentation

## 2013-09-13 DIAGNOSIS — I10 Essential (primary) hypertension: Secondary | ICD-10-CM | POA: Insufficient documentation

## 2013-09-13 DIAGNOSIS — M25519 Pain in unspecified shoulder: Secondary | ICD-10-CM | POA: Insufficient documentation

## 2013-09-13 DIAGNOSIS — R142 Eructation: Secondary | ICD-10-CM

## 2013-09-13 DIAGNOSIS — E119 Type 2 diabetes mellitus without complications: Secondary | ICD-10-CM | POA: Insufficient documentation

## 2013-09-13 HISTORY — DX: Type 2 diabetes mellitus without complications: E11.9

## 2013-09-13 LAB — COMPREHENSIVE METABOLIC PANEL
ALK PHOS: 103 U/L (ref 39–117)
ALT: 36 U/L — ABNORMAL HIGH (ref 0–35)
AST: 30 U/L (ref 0–37)
Albumin: 3.9 g/dL (ref 3.5–5.2)
Anion gap: 12 (ref 5–15)
BUN: 16 mg/dL (ref 6–23)
CALCIUM: 9.9 mg/dL (ref 8.4–10.5)
CO2: 28 meq/L (ref 19–32)
Chloride: 101 mEq/L (ref 96–112)
Creatinine, Ser: 0.85 mg/dL (ref 0.50–1.10)
GFR calc Af Amer: 83 mL/min — ABNORMAL LOW (ref >90)
GFR calc non Af Amer: 71 mL/min — ABNORMAL LOW (ref >90)
Glucose, Bld: 116 mg/dL — ABNORMAL HIGH (ref 70–99)
Potassium: 3.8 mEq/L (ref 3.7–5.3)
Sodium: 141 mEq/L (ref 137–147)
TOTAL PROTEIN: 7.6 g/dL (ref 6.0–8.3)
Total Bilirubin: <0.2 mg/dL — ABNORMAL LOW (ref 0.3–1.2)

## 2013-09-13 LAB — CBC WITH DIFFERENTIAL/PLATELET
BASOS ABS: 0 10*3/uL (ref 0.0–0.1)
Basophils Relative: 0 % (ref 0–1)
EOS ABS: 0.2 10*3/uL (ref 0.0–0.7)
EOS PCT: 3 % (ref 0–5)
HCT: 40.4 % (ref 36.0–46.0)
Hemoglobin: 13.7 g/dL (ref 12.0–15.0)
LYMPHS PCT: 60 % — AB (ref 12–46)
Lymphs Abs: 5.6 10*3/uL — ABNORMAL HIGH (ref 0.7–4.0)
MCH: 25.9 pg — ABNORMAL LOW (ref 26.0–34.0)
MCHC: 33.9 g/dL (ref 30.0–36.0)
MCV: 76.5 fL — AB (ref 78.0–100.0)
Monocytes Absolute: 0.7 10*3/uL (ref 0.1–1.0)
Monocytes Relative: 8 % (ref 3–12)
Neutro Abs: 2.6 10*3/uL (ref 1.7–7.7)
Neutrophils Relative %: 29 % — ABNORMAL LOW (ref 43–77)
PLATELETS: 263 10*3/uL (ref 150–400)
RBC: 5.28 MIL/uL — AB (ref 3.87–5.11)
RDW: 15.6 % — AB (ref 11.5–15.5)
WBC: 9.1 10*3/uL (ref 4.0–10.5)

## 2013-09-13 LAB — TROPONIN I
Troponin I: <0.3 ng/mL (ref <0.30)
Troponin I: <0.3 ng/mL (ref <0.30)

## 2013-09-13 MED ORDER — OMEPRAZOLE 20 MG PO CPDR: 20.0000 mg | DELAYED_RELEASE_CAPSULE | Freq: Every day | ORAL | Status: DC

## 2013-09-13 MED ORDER — FAMOTIDINE 20 MG PO TABS
20.0000 mg | ORAL_TABLET | Freq: Once | ORAL | Status: AC
  Administered 2013-09-13: 20 mg via ORAL
  Filled 2013-09-13: qty 1

## 2013-09-13 MED ORDER — GI COCKTAIL ~~LOC~~
30.0000 mL | Freq: Once | ORAL | Status: AC
  Administered 2013-09-13: 30 mL via ORAL
  Filled 2013-09-13: qty 30

## 2013-09-13 MED ORDER — FENTANYL CITRATE 0.05 MG/ML IJ SOLN: 50.0000 ug | INTRAMUSCULAR | Status: DC | PRN

## 2013-09-13 MED ORDER — ASPIRIN 81 MG PO CHEW
324.0000 mg | CHEWABLE_TABLET | Freq: Once | ORAL | Status: AC
  Administered 2013-09-13: 324 mg via ORAL
  Filled 2013-09-13: qty 4

## 2013-09-13 NOTE — ED Notes (Signed)
Pt. reports left neck pain / left shoulder pain radiating to left upper back onset this evening with " indigestion / burping a lot" Denies chest pain or abdominal pain . No fever or chills.

## 2013-09-13 NOTE — Discharge Instructions (Signed)
If you were given medicines take as directed.  If you are on coumadin or contraceptives realize their levels and effectiveness is altered by many different medicines.  If you have any reaction (rash, tongues swelling, other) to the medicines stop taking and see a physician.   Please follow up as directed and return to the ER or see a physician for new or worsening symptoms.  Thank you. Filed Vitals:   09/13/13 0213 09/13/13 0230 09/13/13 0300 09/13/13 0400  BP:  100/82 147/55 136/68  Pulse: 64 57 56 55  Temp:      TempSrc:      Resp: 14 15 20 16   Height:      Weight:      SpO2: 98% 99% 99% 100%

## 2013-09-13 NOTE — ED Provider Notes (Signed)
CSN: [id number]     Arrival date & time 09/13/13  0021 History   First MD Initiated Contact with Patient 09/13/13 0035     Chief Complaint  Patient presents with  . Shoulder Pain     (Consider location/radiation/quality/duration/timing/severity/associated sxs/prior Treatment) HPI Comments: 63 year old female with history of high blood pressure, lipids, reflux presents with indigestion symptoms in left neck and shoulder pain. Since 6:00 this evening patient has had gas buildup sensation with recurrent burping and worsening left shoulder and neck pain. Patient has had tightness and pain in the neck intermittently for some time now. No weakness or numbness. No injuries or cardiac history. No recent surgeries or blood clot history. Patient denies chest discomfort or shortness of breath. Symptoms are constant. No known gallbladder problems. Patient tried antacids with minimal relief. Patient has had intermittent diaphoresis however she has menopause symptoms so this is not new. No exertional chest pain.  Patient is a 63 y.o. female presenting with shoulder pain. The history is provided by the patient.  Shoulder Pain This is a recurrent problem. Pertinent negatives include no chest pain, no abdominal pain, no headaches and no shortness of breath.    Past Medical History  Diagnosis Date  . GERD (gastroesophageal reflux disease)   . Hypertension   . Bronchitis   . Depression 70YRS AGO  . Diabetes mellitus without complication    Past Surgical History  Procedure Laterality Date  . Rotator cuff suregery    . Abdominal hysterectomy      PARTIAL  . Bunionectomy  2009  . Rotator cuff repair  2008   Family History  Problem Relation Age of Onset  . Diabetes Mother   . Heart disease Mother   . Diabetes Father   . Cancer Father     LUNG  . Hyperlipidemia Brother    History  Substance Use Topics  . Smoking status: Never Smoker   . Smokeless tobacco: Not on file  . Alcohol Use: Yes   OB  History   Grav Para Term Preterm Abortions TAB SAB Ect Mult Living                 Review of Systems  Constitutional: Negative for fever and chills.  HENT: Negative for congestion.   Eyes: Negative for visual disturbance.  Respiratory: Negative for shortness of breath.   Cardiovascular: Negative for chest pain.  Gastrointestinal: Negative for vomiting and abdominal pain.  Genitourinary: Negative for dysuria and flank pain.  Musculoskeletal: Positive for arthralgias. Negative for back pain, neck pain and neck stiffness.  Skin: Negative for rash.  Neurological: Negative for light-headedness and headaches.      Allergies  Lisinopril and Vicodin  Home Medications   Prior to Admission medications   Medication Sig Start Date End Date Taking? Authorizing Provider  cetirizine (ZYRTEC) 10 MG tablet Take 1 tablet (10 mg total) by mouth daily. 09/07/12  Yes Robbie Lis, MD  albuterol (PROVENTIL HFA;VENTOLIN HFA) 108 (90 BASE) MCG/ACT inhaler Inhale 2 puffs into the lungs daily as needed for wheezing or shortness of breath. For shortness of breath or wheezing 11/18/12   Clanford L Wynetta Emery, MD   BP 146/74  Pulse 64  Temp(Src) 98.5 F (36.9 C) (Oral)  Resp 17  Ht 5\' 4"  (1.626 m)  Wt 192 lb (87.091 kg)  BMI 32.94 kg/m2  SpO2 99% Physical Exam  Nursing note and vitals reviewed. Constitutional: She is oriented to person, place, and time. She appears well-developed and well-nourished.  HENT:  Head: Normocephalic and atraumatic.  Eyes: Conjunctivae are normal. Right eye exhibits no discharge. Left eye exhibits no discharge.  Neck: Normal range of motion. Neck supple. No tracheal deviation present.  Cardiovascular: Normal rate, regular rhythm and intact distal pulses.   Pulmonary/Chest: Effort normal and breath sounds normal.  Abdominal: Soft. She exhibits no distension. There is no tenderness. There is no guarding.  Musculoskeletal: She exhibits no edema.  Neurological: She is alert  and oriented to person, place, and time.  Skin: Skin is warm. No rash noted.  Psychiatric: She has a normal mood and affect.    ED Course  Procedures (including critical care time) Labs Review Labs Reviewed  CBC WITH DIFFERENTIAL - Abnormal; Notable for the following:    RBC 5.28 (*)    MCV 76.5 (*)    MCH 25.9 (*)    RDW 15.6 (*)    Neutrophils Relative % 29 (*)    Lymphocytes Relative 60 (*)    Lymphs Abs 5.6 (*)    All other components within normal limits  COMPREHENSIVE METABOLIC PANEL - Abnormal; Notable for the following:    Glucose, Bld 116 (*)    ALT 36 (*)    Total Bilirubin <0.2 (*)    GFR calc non Af Amer 71 (*)    GFR calc Af Amer 83 (*)    All other components within normal limits  TROPONIN I  TROPONIN I    Imaging Review Dg Chest 2 View  09/13/2013   CLINICAL DATA:  Left upper chest pain into the left shoulder.  EXAM: CHEST  2 VIEW  COMPARISON:  11/15/2012  FINDINGS: The heart size and mediastinal contours are within normal limits. Both lungs are clear. The visualized skeletal structures are unremarkable.  IMPRESSION: No active cardiopulmonary disease.   Electronically Signed   By: Lucienne Capers M.D.   On: 09/13/2013 01:55     EKG Interpretation   Date/Time:  Tuesday September 13 2013 00:40:42 EDT Ventricular Rate:  75 PR Interval:  216 QRS Duration: 90 QT Interval:  419 QTC Calculation: 468 R Axis:   34 Text Interpretation:  Sinus rhythm Abnormal R-wave progression, early  transition Borderline T wave abnormalities Baseline wander in lead(s) I II  III aVR aVF V1 V2 V3 V4 V5 V6 Confirmed by Gennie Eisinger  MD, Sanyiah Kanzler ([id number]) on  09/13/2013 1:17:10 AM      MDM   Final diagnoses:  Left shoulder pain  Belching symptom   Patient with a few cardiac risk factors presents with indigestion type symptoms and neck and shoulder pain on the left. On exam neck and shoulder pain appear musculoskeletal very tight musculature patient has had this in the past and worse with  palpation range of motion. Indigestion symptoms likely reflux however with cardiac risk factors and symptoms more severe plan for cardiac screen and delta troponin chest x-ray and blood work. Antacid medicines given. EKG poor baseline however similar previous poor R wave progression.  Multiple rechecks and patient has no chest pain or shortnes Well-appearing. Delta troponin negative and discussed outpatient followup for outpatient stress test, patient comfortable this plan.s of breath.  Results and differential diagnosis were discussed with the patient/parent/guardian. Close follow up outpatient was discussed, comfortable with the plan.   Medications  famotidine (PEPCID) tablet 20 mg (20 mg Oral Given 09/13/13 0138)  gi cocktail (Maalox,Lidocaine,Donnatal) (30 mLs Oral Given 09/13/13 0138)  aspirin chewable tablet 324 mg (324 mg Oral Given 09/13/13 0159)    Danley Danker  Vitals:   09/13/13 0300 09/13/13 0400 09/13/13 0430 09/13/13 0500  BP: 147/55 136/68 145/57 137/71  Pulse: 56 55 54 55  Temp:      TempSrc:      Resp: 20 16 13 18   Height:      Weight:      SpO2: 99% 100% 100% 99%       Mariea Clonts, MD 09/14/13 [phone]

## 2013-12-15 ENCOUNTER — Encounter (HOSPITAL_COMMUNITY): Payer: Self-pay | Admitting: Emergency Medicine

## 2013-12-15 ENCOUNTER — Emergency Department (HOSPITAL_COMMUNITY)
Admission: EM | Admit: 2013-12-15 | Discharge: 2013-12-15 | Disposition: A | Discharge status: Home / Self Care | Payer: BC Managed Care – PPO | Attending: Emergency Medicine | Admitting: Emergency Medicine

## 2013-12-15 DIAGNOSIS — I10 Essential (primary) hypertension: Secondary | ICD-10-CM | POA: Insufficient documentation

## 2013-12-15 DIAGNOSIS — E119 Type 2 diabetes mellitus without complications: Secondary | ICD-10-CM | POA: Insufficient documentation

## 2013-12-15 DIAGNOSIS — H00016 Hordeolum externum left eye, unspecified eyelid: Secondary | ICD-10-CM | POA: Insufficient documentation

## 2013-12-15 DIAGNOSIS — K219 Gastro-esophageal reflux disease without esophagitis: Secondary | ICD-10-CM | POA: Insufficient documentation

## 2013-12-15 DIAGNOSIS — H02846 Edema of left eye, unspecified eyelid: Secondary | ICD-10-CM | POA: Insufficient documentation

## 2013-12-15 DIAGNOSIS — Z8659 Personal history of other mental and behavioral disorders: Secondary | ICD-10-CM | POA: Insufficient documentation

## 2013-12-15 DIAGNOSIS — Z79899 Other long term (current) drug therapy: Secondary | ICD-10-CM | POA: Insufficient documentation

## 2013-12-15 DIAGNOSIS — J209 Acute bronchitis, unspecified: Secondary | ICD-10-CM | POA: Insufficient documentation

## 2013-12-15 MED ORDER — ERYTHROMYCIN 5 MG/GM OP OINT: TOPICAL_OINTMENT | OPHTHALMIC | Status: DC

## 2013-12-15 MED ORDER — CEPHALEXIN 500 MG PO CAPS: 500.0000 mg | ORAL_CAPSULE | Freq: Four times a day (QID) | ORAL | Status: DC

## 2013-12-15 NOTE — Discharge Instructions (Signed)
Sty A sty (hordeolum) is an infection of a gland in the eyelid located at the base of the eyelash. A sty may develop a white or yellow head of pus. It can be puffy (swollen). Usually, the sty will burst and pus will come out on its own. They do not leave lumps in the eyelid once they drain. A sty is often confused with another form of cyst of the eyelid called a chalazion. Chalazions occur within the eyelid and not on the edge where the bases of the eyelashes are. They often are red, sore and then form firm lumps in the eyelid. CAUSES   Germs (bacteria).  Lasting (chronic) eyelid inflammation. SYMPTOMS   Tenderness, redness and swelling along the edge of the eyelid at the base of the eyelashes.  Sometimes, there is a white or yellow head of pus. It may or may not drain. DIAGNOSIS  An ophthalmologist will be able to distinguish between a sty and a chalazion and treat the condition appropriately.  TREATMENT   Styes are typically treated with warm packs (compresses) until drainage occurs.  In rare cases, medicines that kill germs (antibiotics) may be prescribed. These antibiotics may be in the form of drops, cream or pills.  If a hard lump has formed, it is generally necessary to do a small incision and remove the hardened contents of the cyst in a minor surgical procedure done in the office.  In suspicious cases, your caregiver may send the contents of the cyst to the lab to be certain that it is not a rare, but dangerous form of cancer of the glands of the eyelid. HOME CARE INSTRUCTIONS   Wash your hands often and dry them with a clean towel. Avoid touching your eyelid. This may spread the infection to other parts of the eye.  Apply heat to your eyelid for 10 to 20 minutes, several times a day, to ease pain and help to heal it faster.  Do not squeeze the sty. Allow it to drain on its own. Wash your eyelid carefully 3 to 4 times per day to remove any pus. SEEK IMMEDIATE MEDICAL CARE IF:     Your eye becomes painful or puffy (swollen).  Your vision changes.  Your sty does not drain by itself within 3 days.  Your sty comes back within a short period of time, even with treatment.  You have redness (inflammation) around the eye.  You have a fever. Document Released: 11/13/2004 Document Revised: 04/28/2011 Document Reviewed: 05/20/2013 [facility] Patient Information 2015 [place], [place]. This information is not intended to replace advice given to you by your health care provider. Make sure you discuss any questions you have with your health care provider. Orbital Cellulitis  Orbital cellulitis is an infection of the soft tissue of the orbit without abscess formation. The eye socket is the area around and behind the eye. It usually comes on suddenly in children, but can happen at any age. This condition can lead to death if untreated.  CAUSES   A germ (bacterial) infection of the area around and behind the eye.  Long-term (chronic) sinus infections.  An object (foreign body) stuck behind the eye.  An injury that goes through (penetrates) to tissues of the eyelids.  Trauma with secondary infection.  Fracture of the boney wall or floor of the orbit.  Serious eyelid infections.  Bite wounds.  Inflammation or infection of the lining membranes of the brain (meningitis).  Blood poisoning or infection (septicemia).  Dental area  filled with pus (abscesses).  Severe uncontrolled diabetes (ketoacidosis). SYMPTOMS   Pain in the eye.  Redness and puffiness (swelling) of the eyelids. The swelling is often bad enough that the eye has to close.  Fever and feeling generally ill.  The lids and the cheek may be very red, hot and swollen.  A drop in vision.  Pain when touching the area around the eye.  The eye itself may look like it is "popping out" (proptosis).  Double vision - seeing two of everything (diplopia). DIAGNOSIS  An ophthalmologist can tell you if you  have orbital cellulitis during an eye exam. It is important to know if the infection goes into the area behind the eye. True orbital cellulitis is a medical emergency. A CT scan may be needed to see if the sinuses are involved. A CT scan can also see if an abscess has formed behind the eye. TREATMENT  Orbital cellulitis should be treated in the hospital with medicine that kills germs (antibiotics). These antibiotics are given right into the bloodstream through a vein (intravenous). SEEK IMMEDIATE MEDICAL CARE IF:   You have red, swollen eyelids.  You have a fever.  You develop double vision. MAKE SURE YOU:   Understand these instructions.  Will watch your condition.  Will get help right away if you are not doing well or get worse. Document Released: 01/28/2001 Document Revised: 04/28/2011 Document Reviewed: 05/31/2007 [facility] Patient Information 2015 [place], [place]. This information is not intended to replace advice given to you by your health care provider. Make sure you discuss any questions you have with your health care provider.

## 2013-12-15 NOTE — ED Provider Notes (Signed)
CSN: [id number]     Arrival date & time 12/15/13  0945 History  This chart was scribed for non-physician practitioner, Margarita Mail, PA-C, working with Richarda Blade, MD by Ladene Artist, ED Scribe. This patient was seen in room TR04C/TR04C and the patient's care was started at 10:35 AM.   Chief Complaint  Patient presents with  . Facial Swelling   The history is provided by the patient. No language interpreter was used.   HPI Comments: Alyssa Armstrong is a 63 y.o. female who presents to the Emergency Department complaining of gradually worsening L eye lid swelling onset 3 days ago. She reports associated pain to the eyelid and itching onset 3 days ago. She denies visual disturbances. She has tried eye drops without relief.   Past Medical History  Diagnosis Date  . GERD (gastroesophageal reflux disease)   . Hypertension   . Bronchitis   . Depression 76YRS AGO  . Diabetes mellitus without complication    Past Surgical History  Procedure Laterality Date  . Rotator cuff suregery    . Abdominal hysterectomy      PARTIAL  . Bunionectomy  2009  . Rotator cuff repair  2008   Family History  Problem Relation Age of Onset  . Diabetes Mother   . Heart disease Mother   . Diabetes Father   . Cancer Father     LUNG  . Hyperlipidemia Brother    History  Substance Use Topics  . Smoking status: Never Smoker   . Smokeless tobacco: Not on file  . Alcohol Use: Yes   OB History   Grav Para Term Preterm Abortions TAB SAB Ect Mult Living                 Review of Systems  HENT: Positive for facial swelling.   Eyes: Positive for pain and itching. Negative for visual disturbance.  All other systems reviewed and are negative.  Allergies  Lisinopril and Vicodin  Home Medications   Prior to Admission medications   Medication Sig Start Date End Date Taking? Authorizing Provider  albuterol (PROVENTIL HFA;VENTOLIN HFA) 108 (90 BASE) MCG/ACT inhaler Inhale 2 puffs into the lungs  daily as needed for wheezing or shortness of breath. For shortness of breath or wheezing 11/18/12   Clanford Marisa Hua, MD  cetirizine (ZYRTEC) 10 MG tablet Take 1 tablet (10 mg total) by mouth daily. 09/07/12   Robbie Lis, MD  omeprazole (PRILOSEC) 20 MG capsule Take 1 capsule (20 mg total) by mouth daily. 09/13/13   Mariea Clonts, MD   Triage Vitals: BP 143/61  Pulse 88  Temp(Src) 98.5 F (36.9 C) (Oral)  Resp 20  SpO2 98% Physical Exam  Nursing note and vitals reviewed. Constitutional: She is oriented to person, place, and time. She appears well-developed and well-nourished. No distress.  HENT:  Head: Normocephalic and atraumatic.  Eyes: Conjunctivae and EOM are normal. Pupils are equal, round, and reactive to light.  Mattering of lashes on L  Swelling of lid and firm nodule on the medial upper lash consistent with stye  Neck: Neck supple.  Pulmonary/Chest: Effort normal. No respiratory distress.  Musculoskeletal: Normal range of motion.  Neurological: She is alert and oriented to person, place, and time.  Skin: Skin is warm and dry.  Psychiatric: She has a normal mood and affect. Her behavior is normal.   ED Course  Procedures (including critical care time) DIAGNOSTIC STUDIES: Oxygen Saturation is 98% on RA, normal by my  interpretation.    COORDINATION OF CARE: 10:40 AM-Discussed treatment plan which includes antibiotic ointment, warm compresses and return precautions with pt at bedside and pt agreed to plan.   Labs Review Labs Reviewed - No data to display  Imaging Review No results found.   EKG Interpretation None      MDM   Final diagnoses:  Stye external, left  Swelling of eyelid, left    Normal visual acuity Appears to have swelling secondary to a stye. Some lash mattering. D/c with emycin ointment. No obvious signs of cellulitis but will give directions and abx to hold for worsening symptoms. Advise f/u with ophthalmology.  I personally performed the  services described in this documentation, which was scribed in my presence. The recorded information has been reviewed and is accurate.    Margarita Mail, PA-C 12/20/13 1751

## 2013-12-15 NOTE — ED Notes (Signed)
Pt c/o left eye upper lid swelling and itching per pt x 3 days; no change in vision

## 2013-12-15 NOTE — ED Notes (Signed)
Family at bedside. 

## 2014-07-25 ENCOUNTER — Other Ambulatory Visit: Payer: Self-pay

## 2014-07-25 DIAGNOSIS — Z1231 Encounter for screening mammogram for malignant neoplasm of breast: Secondary | ICD-10-CM

## 2014-07-26 ENCOUNTER — Ambulatory Visit
Admission: RE | Admit: 2014-07-26 | Discharge: 2014-07-26 | Disposition: A | Discharge status: Home / Self Care | Payer: No Typology Code available for payment source | Source: Ambulatory Visit

## 2014-07-26 DIAGNOSIS — Z1231 Encounter for screening mammogram for malignant neoplasm of breast: Secondary | ICD-10-CM

## 2015-09-05 ENCOUNTER — Other Ambulatory Visit: Payer: Self-pay | Admitting: Family Medicine

## 2015-09-05 DIAGNOSIS — Z1231 Encounter for screening mammogram for malignant neoplasm of breast: Secondary | ICD-10-CM

## 2015-09-12 ENCOUNTER — Ambulatory Visit: Payer: No Typology Code available for payment source

## 2015-09-17 ENCOUNTER — Other Ambulatory Visit: Payer: Self-pay | Admitting: Family Medicine

## 2015-09-17 ENCOUNTER — Ambulatory Visit
Admission: RE | Admit: 2015-09-17 | Discharge: 2015-09-17 | Disposition: A | Discharge status: Home / Self Care | Payer: Medicare HMO | Source: Ambulatory Visit | Attending: Family Medicine | Admitting: Family Medicine

## 2015-09-17 DIAGNOSIS — E2839 Other primary ovarian failure: Secondary | ICD-10-CM

## 2015-09-17 DIAGNOSIS — Z1231 Encounter for screening mammogram for malignant neoplasm of breast: Secondary | ICD-10-CM

## 2015-09-20 ENCOUNTER — Ambulatory Visit
Admission: RE | Admit: 2015-09-20 | Discharge: 2015-09-20 | Disposition: A | Discharge status: Home / Self Care | Payer: Medicare HMO | Source: Ambulatory Visit | Attending: Family Medicine | Admitting: Family Medicine

## 2015-09-20 DIAGNOSIS — E2839 Other primary ovarian failure: Secondary | ICD-10-CM

## 2015-10-21 ENCOUNTER — Emergency Department (HOSPITAL_COMMUNITY)
Admission: EM | Admit: 2015-10-21 | Discharge: 2015-10-21 | Disposition: A | Discharge status: Home / Self Care | Payer: Medicare HMO | Attending: Emergency Medicine | Admitting: Emergency Medicine

## 2015-10-21 ENCOUNTER — Emergency Department (HOSPITAL_COMMUNITY): Payer: Medicare HMO

## 2015-10-21 ENCOUNTER — Other Ambulatory Visit: Payer: Self-pay

## 2015-10-21 ENCOUNTER — Encounter (HOSPITAL_COMMUNITY): Payer: Self-pay

## 2015-10-21 ENCOUNTER — Emergency Department (HOSPITAL_BASED_OUTPATIENT_CLINIC_OR_DEPARTMENT_OTHER)
Admit: 2015-10-21 | Discharge: 2015-10-21 | Disposition: A | Discharge status: Home / Self Care | Payer: Medicare HMO | Attending: Emergency Medicine | Admitting: Emergency Medicine

## 2015-10-21 DIAGNOSIS — R079 Chest pain, unspecified: Secondary | ICD-10-CM | POA: Insufficient documentation

## 2015-10-21 DIAGNOSIS — M79609 Pain in unspecified limb: Secondary | ICD-10-CM | POA: Diagnosis not present

## 2015-10-21 DIAGNOSIS — M79662 Pain in left lower leg: Secondary | ICD-10-CM | POA: Insufficient documentation

## 2015-10-21 DIAGNOSIS — Z79899 Other long term (current) drug therapy: Secondary | ICD-10-CM | POA: Insufficient documentation

## 2015-10-21 DIAGNOSIS — M79661 Pain in right lower leg: Secondary | ICD-10-CM | POA: Insufficient documentation

## 2015-10-21 DIAGNOSIS — R252 Cramp and spasm: Secondary | ICD-10-CM | POA: Diagnosis not present

## 2015-10-21 DIAGNOSIS — I10 Essential (primary) hypertension: Secondary | ICD-10-CM | POA: Insufficient documentation

## 2015-10-21 DIAGNOSIS — E119 Type 2 diabetes mellitus without complications: Secondary | ICD-10-CM | POA: Diagnosis not present

## 2015-10-21 LAB — CBC WITH DIFFERENTIAL/PLATELET
BASOS ABS: 0 10*3/uL (ref 0.0–0.1)
Basophils Relative: 0 %
Eosinophils Absolute: 0.2 10*3/uL (ref 0.0–0.7)
Eosinophils Relative: 4 %
HCT: 38.1 % (ref 36.0–46.0)
Hemoglobin: 12.6 g/dL (ref 12.0–15.0)
LYMPHS ABS: 3.3 10*3/uL (ref 0.7–4.0)
Lymphocytes Relative: 68 %
MCH: 25.4 pg — ABNORMAL LOW (ref 26.0–34.0)
MCHC: 33.1 g/dL (ref 30.0–36.0)
MCV: 76.7 fL — ABNORMAL LOW (ref 78.0–100.0)
MONO ABS: 0.5 10*3/uL (ref 0.1–1.0)
Monocytes Relative: 10 %
NEUTROS PCT: 18 %
Neutro Abs: 0.9 10*3/uL — ABNORMAL LOW (ref 1.7–7.7)
PLATELETS: 227 10*3/uL (ref 150–400)
RBC: 4.97 MIL/uL (ref 3.87–5.11)
RDW: 14.4 % (ref 11.5–15.5)
WBC: 4.9 10*3/uL (ref 4.0–10.5)

## 2015-10-21 LAB — D-DIMER, QUANTITATIVE (NOT AT ARMC): D DIMER QUANT: 0.59 ug{FEU}/mL — AB (ref 0.00–0.50)

## 2015-10-21 LAB — BASIC METABOLIC PANEL
Anion gap: 5 (ref 5–15)
BUN: 18 mg/dL (ref 6–20)
CO2: 21 mmol/L — ABNORMAL LOW (ref 22–32)
Calcium: 9 mg/dL (ref 8.9–10.3)
Chloride: 109 mmol/L (ref 101–111)
Creatinine, Ser: 0.97 mg/dL (ref 0.44–1.00)
GFR, EST NON AFRICAN AMERICAN: 60 mL/min — AB (ref >60)
Glucose, Bld: 145 mg/dL — ABNORMAL HIGH (ref 65–99)
POTASSIUM: 4.9 mmol/L (ref 3.5–5.1)
Sodium: 135 mmol/L (ref 135–145)

## 2015-10-21 LAB — I-STAT TROPONIN, ED
TROPONIN I, POC: 0.01 ng/mL (ref 0.00–0.08)
Troponin i, poc: 0 ng/mL (ref 0.00–0.08)

## 2015-10-21 MED ORDER — IOPAMIDOL (ISOVUE-370) INJECTION 76%
INTRAVENOUS | Status: AC
  Administered 2015-10-21: 100 mL
  Filled 2015-10-21: qty 100

## 2015-10-21 MED ORDER — TIZANIDINE HCL 2 MG PO TABS: 2.0000 mg | ORAL_TABLET | Freq: Three times a day (TID) | ORAL | 0 refills | Status: DC | PRN

## 2015-10-21 MED ORDER — LORAZEPAM 2 MG/ML IJ SOLN
1.0000 mg | Freq: Once | INTRAMUSCULAR | Status: AC
  Administered 2015-10-21: 1 mg via INTRAVENOUS
  Filled 2015-10-21: qty 1

## 2015-10-21 NOTE — ED Notes (Signed)
Pt. Ambulated to the front of the ED to go home. Pt. Refused a wheelchair.  Gait was steady.  Family with pt.

## 2015-10-21 NOTE — Progress Notes (Signed)
VASCULAR LAB PRELIMINARY  PRELIMINARY  PRELIMINARY  PRELIMINARY  Bilateral lower extremity venous duplex completed.    Preliminary report:  There is no DVT or SVT noted in the bilateral lower extremities.   Rod Majerus, RVT 10/21/2015, 12:55 PM

## 2015-10-21 NOTE — ED Provider Notes (Signed)
[facility] DEPT Provider Note   CSN: [id number] Arrival date & time: 10/21/15  [id number]     History   Chief Complaint Chief Complaint  Patient presents with  . Chest Pain    HPI Alyssa Armstrong is a 65 y.o. female.  The history is provided by the patient and the EMS personnel. No language interpreter was used.   Alyssa Armstrong is a 65 y.o. female who presents to the Emergency Department complaining of chest pain, leg pain.  Alyssa Armstrong was in her retained state of health this morning. She sat down in a recliner and took a nap and when she awoke she had severe cramping in bilateral calves. The cramping continued to worsen and then she developed some central chest pain, shortness of breath, diaphoresis. Normal lungs called. On EMS arrival she was given 324 aspirin and noted to be hypotensive with a blood pressure in the 80 systolic. She was given 1 L of normal saline and her chest pain, diaphoresis resolved and she states she feels significantly improved. She does have some mild cramping in bilateral calves currently. She denies any fevers, abdominal pain, vomiting. No prior similar symptoms. No history of DVT, PE, coronary artery disease. Past Medical History:  Diagnosis Date  . Bronchitis   . Depression 26YRS AGO  . Diabetes mellitus without complication ([place])   . GERD (gastroesophageal reflux disease)   . Hypertension     Patient Active Problem List   Diagnosis Date Noted  . HTN (hypertension) 09/07/2012  . Dyslipidemia 09/07/2012  . PES PLANUS 02/28/2008    Past Surgical History:  Procedure Laterality Date  . ABDOMINAL HYSTERECTOMY     PARTIAL  . BUNIONECTOMY  2009  . ROTATOR CUFF REPAIR  2008  . rotator cuff suregery      OB History    No data available       Home Medications    Prior to Admission medications   Medication Sig Start Date End Date Taking? Authorizing Provider  atorvastatin (LIPITOR) 20 MG tablet Take 20 mg by mouth at bedtime. 10/11/15   Yes Historical Provider, MD  buPROPion (WELLBUTRIN XL) 150 MG 24 hr tablet Take 150 mg by mouth daily. 09/17/15 09/16/16 Yes Historical Provider, MD  cetirizine (ZYRTEC) 10 MG tablet Take 1 tablet (10 mg total) by mouth daily. Patient taking differently: Take 10 mg by mouth daily as needed for allergies.  09/07/12  Yes Robbie Lis, MD  famotidine (PEPCID) 20 MG tablet Take 20 mg by mouth daily. 10/11/15  Yes Historical Provider, MD  losartan (COZAAR) 100 MG tablet Take 100 mg by mouth daily. 08/09/15  Yes Historical Provider, MD  venlafaxine (EFFEXOR) 75 MG tablet Take 75 mg by mouth 2 (two) times daily. 08/22/15  Yes Historical Provider, MD  albuterol (PROVENTIL HFA;VENTOLIN HFA) 108 (90 BASE) MCG/ACT inhaler Inhale 2 puffs into the lungs daily as needed for wheezing or shortness of breath. For shortness of breath or wheezing 11/18/12   Clanford Marisa Hua, MD  omeprazole (PRILOSEC) 20 MG capsule Take 1 capsule (20 mg total) by mouth daily. Patient not taking: Reported on 10/21/2015 09/13/13   Elnora Morrison, MD  tiZANidine (ZANAFLEX) 2 MG tablet Take 1 tablet (2 mg total) by mouth every 8 (eight) hours as needed for muscle spasms. 10/21/15   Quintella Reichert, MD    Family History Family History  Problem Relation Age of Onset  . Diabetes Mother   . Heart disease Mother   . Diabetes Father   .  Cancer Father     LUNG  . Hyperlipidemia Brother     Social History Social History  Substance Use Topics  . Smoking status: Never Smoker  . Smokeless tobacco: Never Used  . Alcohol use Yes     Allergies   Vicodin [hydrocodone-acetaminophen] and Lisinopril   Review of Systems Review of Systems  All other systems reviewed and are negative.    Physical Exam Updated Vital Signs BP 108/56   Pulse 77   Temp 97.2 F (36.2 C) (Oral)   Resp 24   Ht 5\' 4"  (1.626 m)   Wt 180 lb (81.6 kg)   SpO2 99%   BMI 30.90 kg/m   Physical Exam  Constitutional: She is oriented to person, place, and time. She  appears well-developed and well-nourished.  HENT:  Head: Normocephalic and atraumatic.  Cardiovascular: Normal rate and regular rhythm.   No murmur heard. Pulmonary/Chest: Effort normal and breath sounds normal. No respiratory distress.  Abdominal: Soft. There is no tenderness. There is no rebound and no guarding.  Musculoskeletal: She exhibits no edema.  2+ DP pulses bilaterally. Mild calf tenderness bilaterally without any swelling or palpable muscle spasms.  Neurological: She is alert and oriented to person, place, and time.  Skin: Skin is warm and dry.  Psychiatric: Her behavior is normal.  anxious  Nursing note and vitals reviewed.    ED Treatments / Results  Labs (all labs ordered are listed, but only abnormal results are displayed) Labs Reviewed  BASIC METABOLIC PANEL - Abnormal; Notable for the following:       Result Value   CO2 21 (*)    Glucose, Bld 145 (*)    GFR calc non Af Amer 60 (*)    All other components within normal limits  CBC WITH DIFFERENTIAL/PLATELET - Abnormal; Notable for the following:    MCV 76.7 (*)    MCH 25.4 (*)    Neutro Abs 0.9 (*)    All other components within normal limits  D-DIMER, QUANTITATIVE (NOT AT [facility]) - Abnormal; Notable for the following:    D-Dimer, Quant 0.59 (*)    All other components within normal limits  I-STAT TROPOININ, ED  I-STAT TROPOININ, ED    EKG  EKG Interpretation  Date/Time:  Sunday October 21 2015 09:28:06 EDT Ventricular Rate:  73 PR Interval:    QRS Duration: 82 QT Interval:  434 QTC Calculation: 479 R Axis:   53 Text Interpretation:  Age not entered, assumed to be  65 years old for purpose of ECG interpretation Sinus rhythm Abnormal R-wave progression, early transition Borderline prolonged QT interval Confirmed by Hazle Coca [phone]) on 10/21/2015 10:08:50 AM       Radiology Ct Angio Chest Pe W/cm &/or Wo Cm  Result Date: 10/21/2015 CLINICAL DATA:  65 year old female with acute shortness of breath  and chest pain for 1 day. EXAM: CT ANGIOGRAPHY CHEST WITH CONTRAST TECHNIQUE: Multidetector CT imaging of the chest was performed using the standard protocol during bolus administration of intravenous contrast. Multiplanar CT image reconstructions and MIPs were obtained to evaluate the vascular anatomy. Multidetector CT imaging through the chest was performed using the standard protocol during bolus administration of intravenous contrast. Multiplanar reconstructed images and MIPs were obtained and reviewed to evaluate the vascular anatomy. CONTRAST:  100 cc intravenous Isovue-300 COMPARISON:  10/21/2015 and prior studies FINDINGS: Cardiovascular: This is a technically satisfactory study. No pulmonary emboli are identified. There is no evidence of thoracic aortic aneurysm or definite dissection. Mild  cardiomegaly identified. Mediastinum/Nodes: No mediastinal mass, enlarged lymph nodes or pericardial effusion. Lungs/Pleura: No airspace disease, consolidation, suspicious nodule, mass, endobronchial/ endotracheal lesion, pleural effusion or pneumothorax identified. Mild peribronchial thickening again noted. A 3 mm right lower lobe nodule (image 84) is unchanged from 05/07/2013 abdominal CT. Upper Abdomen: Unremarkable Musculoskeletal: No acute or suspicious abnormalities. Review of the MIP images confirms the above findings. IMPRESSION: No evidence of acute abnormality. No evidence of pulmonary emboli or thoracic aortic aneurysm/ definite dissection. Mild cardiomegaly and mild peribronchial thickening. Electronically Signed   By: Margarette Canada M.D.   On: 10/21/2015 13:14   Dg Chest Port 1 View  Result Date: 10/21/2015 CLINICAL DATA:  Cough for 2 days with shortness of breath. EXAM: PORTABLE CHEST 1 VIEW COMPARISON:  09/13/2013 FINDINGS: The cardiomediastinal silhouette is unremarkable. There is no evidence of focal airspace disease, pulmonary edema, suspicious pulmonary nodule/mass, pleural effusion, or pneumothorax.  No acute bony abnormalities are identified. IMPRESSION: No active disease. Electronically Signed   By: Margarette Canada M.D.   On: 10/21/2015 10:04    Procedures Procedures (including critical care time)  Medications Ordered in ED Medications  LORazepam (ATIVAN) injection 1 mg (1 mg Intravenous Given 10/21/15 1003)  iopamidol (ISOVUE-370) 76 % injection (100 mLs  Contrast Given 10/21/15 1218)     Initial Impression / Assessment and Plan / ED Course  I have reviewed the triage vital signs and the nursing notes.  Pertinent labs & imaging results that were available during my care of the patient were reviewed by me and considered in my medical decision making (see chart for details).  Clinical Course    Patient here for evaluation following development of severe leg cramping bilaterally. The leg cramping was followed by transient chest pain and diaphoresis. Her chest pain and diaphoresis have resolved on ED arrival following aspirin administration by EMS. She did have transient hypotension with a systolic blood pressure of 80 per EMS. Patient has not had any hypotension in the emergency department. Her d-dimer was minimally elevated, vascular ultrasound of bilateral lower extremities is negative for DVT and CT PE study is negative as well. Presentation is not consistent with ACS, PE, dissection. Patient did have some muscle cramping in her legs, prescribed for muscle relaxant for recurrent symptoms. Question if she had a vagal episode in response to her pain. Discussed importance of PCP follow-up as well as home care and close return precautions.  Final Clinical Impressions(s) / ED Diagnoses   Final diagnoses:  Chest pain, unspecified chest pain type  Cramp of both lower extremities    New Prescriptions Discharge Medication List as of 10/21/2015  2:18 PM    START taking these medications   Details  tiZANidine (ZANAFLEX) 2 MG tablet Take 1 tablet (2 mg total) by mouth every 8 (eight) hours as  needed for muscle spasms., Starting Sun 10/21/2015, Print         Quintella Reichert, MD 10/22/15 [phone]

## 2015-10-21 NOTE — ED Triage Notes (Signed)
Pt. Woke  With leg cramping and developed chest pain.  Felt like a pressure. And it lasted a few minutes.. She is very anxious.  Very tearful.    Pt. Did have nausea  But is resolved at the present time.  Pt. Denies any chest pain. Skin is warm and dry.

## 2015-10-21 NOTE — ED Notes (Signed)
Vascular tech aware of patient 

## 2015-10-23 LAB — PATHOLOGIST SMEAR REVIEW: Path Review: BORDERLINE

## 2016-03-20 DIAGNOSIS — H903 Sensorineural hearing loss, bilateral: Secondary | ICD-10-CM | POA: Insufficient documentation

## 2016-06-20 ENCOUNTER — Encounter (HOSPITAL_COMMUNITY): Payer: Self-pay | Admitting: Emergency Medicine

## 2016-06-20 ENCOUNTER — Ambulatory Visit (HOSPITAL_COMMUNITY)
Admission: EM | Admit: 2016-06-20 | Discharge: 2016-06-20 | Disposition: A | Discharge status: Home / Self Care | Payer: Medicare Other | Attending: Family Medicine | Admitting: Family Medicine

## 2016-06-20 DIAGNOSIS — M62838 Other muscle spasm: Secondary | ICD-10-CM | POA: Diagnosis not present

## 2016-06-20 DIAGNOSIS — M79601 Pain in right arm: Secondary | ICD-10-CM | POA: Diagnosis not present

## 2016-06-20 MED ORDER — BACLOFEN 10 MG PO TABS: 10.0000 mg | ORAL_TABLET | Freq: Three times a day (TID) | ORAL | 0 refills | Status: DC | PRN

## 2016-06-20 NOTE — Discharge Instructions (Signed)
Take baclofen (muscle relaxer) at night for pain. You may also continue aleve twice daily and apply heating pad to the upper back/neck muscles.   Schedule an appointment with your PCP if symptoms continue.

## 2016-06-20 NOTE — ED Provider Notes (Signed)
[facility]    CSN: [id number] Arrival date & time: 06/20/16  1418     History   Chief Complaint Chief Complaint  Patient presents with  . Arm Pain    HPI Alyssa Armstrong is a right hand-dominant 66 y.o. female with a history of right rotator cuff repair in 2008 presenting for right arm pain.   The pain started 1 week ago without provocation/injury/trauma, attributed to the changing of the weather, which has continued intermittently worse at night since that time. This is an ongoing issue that waxes and wanes for her. The pain is diffusely throughout the right arm including the hand with tingling in all fingertips without numbness, the wrist, the forearm, the elbow, upper arm shoulder and right neck. She reports a history of carpal tunnel which as never treated surgically. Aleve has provided some relief.   HPI  Past Medical History:  Diagnosis Date  . Bronchitis   . Depression 68YRS AGO  . Diabetes mellitus without complication ([place])   . GERD (gastroesophageal reflux disease)   . Hypertension     Patient Active Problem List   Diagnosis Date Noted  . HTN (hypertension) 09/07/2012  . Dyslipidemia 09/07/2012  . PES PLANUS 02/28/2008    Past Surgical History:  Procedure Laterality Date  . ABDOMINAL HYSTERECTOMY     PARTIAL  . BUNIONECTOMY  2009  . ROTATOR CUFF REPAIR  2008  . rotator cuff suregery      OB History    No data available       Home Medications    Prior to Admission medications   Medication Sig Start Date End Date Taking? Authorizing Provider  atorvastatin (LIPITOR) 20 MG tablet Take 20 mg by mouth at bedtime. 10/11/15  Yes Historical Provider, MD  buPROPion (WELLBUTRIN XL) 150 MG 24 hr tablet Take 150 mg by mouth daily. 09/17/15 09/16/16 Yes Historical Provider, MD  cetirizine (ZYRTEC) 10 MG tablet Take 1 tablet (10 mg total) by mouth daily. Patient taking differently: Take 10 mg by mouth daily as needed for allergies.  09/07/12  Yes Robbie Lis, MD  famotidine (PEPCID) 20 MG tablet Take 20 mg by mouth daily. 10/11/15  Yes Historical Provider, MD  losartan (COZAAR) 100 MG tablet Take 100 mg by mouth daily. 08/09/15  Yes Historical Provider, MD  venlafaxine (EFFEXOR) 75 MG tablet Take 75 mg by mouth 2 (two) times daily. 08/22/15  Yes Historical Provider, MD  baclofen (LIORESAL) 10 MG tablet Take 1 tablet (10 mg total) by mouth 3 (three) times daily as needed for muscle spasms. 06/20/16   Patrecia Pour, MD    Family History Family History  Problem Relation Age of Onset  . Diabetes Mother   . Heart disease Mother   . Diabetes Father   . Cancer Father     LUNG  . Hyperlipidemia Brother     Social History Social History  Substance Use Topics  . Smoking status: Never Smoker  . Smokeless tobacco: Never Used  . Alcohol use Yes     Allergies   Vicodin [hydrocodone-acetaminophen] and Lisinopril   Review of Systems Review of Systems Per HPI  Physical Exam Triage Vital Signs ED Triage Vitals  Enc Vitals Group     BP 06/20/16 1434 135/72     Pulse Rate 06/20/16 1434 81     Resp 06/20/16 1434 16     Temp 06/20/16 1434 98.5 F (36.9 C)     Temp Source 06/20/16 1434  Oral     SpO2 06/20/16 1434 99 %     Weight --      Height --      Head Circumference --      Peak Flow --      Pain Score 06/20/16 1432 7     Pain Loc --      Pain Edu? --      Excl. in [place]? --    No data found.   Updated Vital Signs BP 135/72 (BP Location: Left Arm)   Pulse 81   Temp 98.5 F (36.9 C) (Oral)   Resp 16   SpO2 99%   Visual Acuity Right Eye Distance:   Left Eye Distance:   Bilateral Distance:    Right Eye Near:   Left Eye Near:    Bilateral Near:     Physical Exam  Constitutional: She is oriented to person, place, and time. She appears well-developed and well-nourished. No distress.  Eyes: EOM are normal. Pupils are equal, round, and reactive to light. No scleral icterus.  Neck: Normal range of motion. Neck supple. No  JVD present.  Spurling's negative  Cardiovascular: Normal rate, regular rhythm, normal heart sounds and intact distal pulses.   No murmur heard. Pulmonary/Chest: Effort normal and breath sounds normal. No respiratory distress.  Musculoskeletal:  RUE with full AROM in shoulder, elbow, wrist, hand. Grip strength diminished compared to left without muscular atrophy. No deformities. Right trapezius spastic and tender. No swelling. Cap refill brisk throughout. Radial pulses 2+ symmetric.   Lymphadenopathy:    She has no cervical adenopathy.  Neurological: She is alert and oriented to person, place, and time. She exhibits normal muscle tone.  Skin: Skin is warm and dry.  Vitals reviewed.  UC Treatments / Results  Labs (all labs ordered are listed, but only abnormal results are displayed) Labs Reviewed - No data to display  EKG  EKG Interpretation None       Radiology No results found.  Procedures Procedures (including critical care time)  Medications Ordered in UC Medications - No data to display   Initial Impression / Assessment and Plan / UC Course  I have reviewed the triage vital signs and the nursing notes.  Pertinent labs & imaging results that were available during my care of the patient were reviewed by me and considered in my medical decision making (see chart for details).   Final Clinical Impressions(s) / UC Diagnoses   Final diagnoses:  Right arm pain  Cervical paraspinal muscle spasm   66 y.o. female with right neck strain and referred right upper extremity pain. Some tingling may be related to carpal tunnel or radiculopathy.  - Antispasmodic, heating pad, NSAID prn.  - Follow up with PCP if symptoms continue >6 weeks  New Prescriptions New Prescriptions   BACLOFEN (LIORESAL) 10 MG TABLET    Take 1 tablet (10 mg total) by mouth 3 (three) times daily as needed for muscle spasms.     Patrecia Pour, MD 06/20/16 1500

## 2016-06-20 NOTE — ED Triage Notes (Signed)
The patient presented to the [facility] with a complaint of right arm pain x 1 week. The patient reported a history of carpal tunnel syndrome.

## 2016-08-25 ENCOUNTER — Other Ambulatory Visit: Payer: Self-pay | Admitting: Family Medicine

## 2016-08-25 DIAGNOSIS — Z1231 Encounter for screening mammogram for malignant neoplasm of breast: Secondary | ICD-10-CM

## 2016-09-17 ENCOUNTER — Ambulatory Visit
Admission: RE | Admit: 2016-09-17 | Discharge: 2016-09-17 | Disposition: A | Discharge status: Home / Self Care | Payer: Medicare Other | Source: Ambulatory Visit | Attending: Family Medicine | Admitting: Family Medicine

## 2016-09-17 DIAGNOSIS — Z1231 Encounter for screening mammogram for malignant neoplasm of breast: Secondary | ICD-10-CM

## 2017-06-17 ENCOUNTER — Encounter (HOSPITAL_COMMUNITY): Payer: Self-pay | Admitting: Emergency Medicine

## 2017-06-17 ENCOUNTER — Other Ambulatory Visit: Payer: Self-pay

## 2017-06-17 ENCOUNTER — Emergency Department (HOSPITAL_COMMUNITY)
Admission: EM | Admit: 2017-06-17 | Discharge: 2017-06-17 | Disposition: A | Discharge status: Home / Self Care | Payer: Medicare Other | Attending: Emergency Medicine | Admitting: Emergency Medicine

## 2017-06-17 DIAGNOSIS — I10 Essential (primary) hypertension: Secondary | ICD-10-CM | POA: Diagnosis not present

## 2017-06-17 DIAGNOSIS — R064 Hyperventilation: Secondary | ICD-10-CM | POA: Diagnosis not present

## 2017-06-17 DIAGNOSIS — F4321 Adjustment disorder with depressed mood: Secondary | ICD-10-CM | POA: Diagnosis not present

## 2017-06-17 DIAGNOSIS — Z79899 Other long term (current) drug therapy: Secondary | ICD-10-CM | POA: Diagnosis not present

## 2017-06-17 DIAGNOSIS — F419 Anxiety disorder, unspecified: Secondary | ICD-10-CM | POA: Diagnosis present

## 2017-06-17 DIAGNOSIS — E119 Type 2 diabetes mellitus without complications: Secondary | ICD-10-CM | POA: Insufficient documentation

## 2017-06-17 MED ORDER — ALPRAZOLAM 0.25 MG PO TABS
0.5000 mg | ORAL_TABLET | Freq: Once | ORAL | Status: AC
  Administered 2017-06-17: 0.5 mg via ORAL

## 2017-06-17 NOTE — ED Triage Notes (Signed)
Pt in with HTN and anxiety after receiving devastating news of her son's death. Denies cp, pt is crying and anxious. BP is 155/92

## 2017-06-17 NOTE — ED Provider Notes (Signed)
[facility] EMERGENCY DEPARTMENT Provider Note   CSN: [id number] Arrival date & time: 06/17/17  0735     History   Chief Complaint Chief Complaint  Patient presents with  . Hypertension  . Anxiety    HPI Alyssa Armstrong is a 67 y.o. female.  Patient presents with severe grieving, anxiety reaction after being informed of the accidental death of her son last night. Pt crying, and tearful, hyperventilating. Patient denies other physical complaint, was in her normal baseline state of health prior to hearing the terrible news. Pt symptoms acute onset, persistent, constant, severe.   The history is provided by the patient and a relative.  Hypertension  Pertinent negatives include no chest pain and no headaches.  Anxiety  Pertinent negatives include no chest pain and no headaches.    Past Medical History:  Diagnosis Date  . Bronchitis   . Depression 51YRS AGO  . Diabetes mellitus without complication ([place])   . GERD (gastroesophageal reflux disease)   . Hypertension     Patient Active Problem List   Diagnosis Date Noted  . HTN (hypertension) 09/07/2012  . Dyslipidemia 09/07/2012  . PES PLANUS 02/28/2008    Past Surgical History:  Procedure Laterality Date  . ABDOMINAL HYSTERECTOMY     PARTIAL  . BUNIONECTOMY  2009  . ROTATOR CUFF REPAIR  2008  . rotator cuff suregery       OB History   None      Home Medications    Prior to Admission medications   Medication Sig Start Date End Date Taking? Authorizing Provider  atorvastatin (LIPITOR) 20 MG tablet Take 20 mg by mouth at bedtime. 10/11/15   [provider]  baclofen (LIORESAL) 10 MG tablet Take 1 tablet (10 mg total) by mouth 3 (three) times daily as needed for muscle spasms. 06/20/16   Patrecia Pour, MD  buPROPion (WELLBUTRIN XL) 150 MG 24 hr tablet Take 150 mg by mouth daily. 09/17/15 09/16/16  [provider]  cetirizine (ZYRTEC) 10 MG tablet Take 1 tablet (10 mg total) by  mouth daily. Patient taking differently: Take 10 mg by mouth daily as needed for allergies.  09/07/12   Robbie Lis, MD  famotidine (PEPCID) 20 MG tablet Take 20 mg by mouth daily. 10/11/15   [provider]  losartan (COZAAR) 100 MG tablet Take 100 mg by mouth daily. 08/09/15   [provider]  venlafaxine (EFFEXOR) 75 MG tablet Take 75 mg by mouth 2 (two) times daily. 08/22/15   [provider]    Family History Family History  Problem Relation Age of Onset  . Diabetes Mother   . Heart disease Mother   . Diabetes Father   . Cancer Father        LUNG  . Hyperlipidemia Brother     Social History Social History   Tobacco Use  . Smoking status: Never Smoker  . Smokeless tobacco: Never Used  Substance Use Topics  . Alcohol use: Yes  . Drug use: No     Allergies   Vicodin [hydrocodone-acetaminophen] and Lisinopril   Review of Systems Review of Systems  Constitutional: Negative for fever.  Cardiovascular: Negative for chest pain.  Gastrointestinal: Negative for vomiting.  Neurological: Negative for headaches.  Psychiatric/Behavioral: The patient is nervous/anxious.      Physical Exam Updated Vital Signs Wt 81.6 kg (180 lb)   BMI 30.90 kg/m   Physical Exam  Constitutional: She appears well-developed and well-nourished. No distress.  HENT:  Head: Atraumatic.  Eyes: Conjunctivae are normal. No scleral icterus.  Neck: Neck supple. No tracheal deviation present.  Cardiovascular: Normal rate, regular rhythm, normal heart sounds and intact distal pulses.  Pulmonary/Chest: Effort normal and breath sounds normal. No respiratory distress.  Abdominal: Normal appearance. She exhibits no distension. There is no tenderness.  Musculoskeletal: She exhibits no edema.  Neurological: She is alert.  Skin: Skin is warm and dry. No rash noted. She is not diaphoretic.  Psychiatric: She has a normal mood and affect.  Nursing note and vitals  reviewed.    ED Treatments / Results  Labs (all labs ordered are listed, but only abnormal results are displayed) Labs Reviewed - No data to display  EKG None  Radiology No results found.  Procedures Procedures (including critical care time)  Medications Ordered in ED Medications  ALPRAZolam (XANAX) tablet 0.5 mg (has no administration in time range)     Initial Impression / Assessment and Plan / ED Course  I have reviewed the triage vital signs and the nursing notes.  Pertinent labs & imaging results that were available during my care of the patient were reviewed by me and considered in my medical decision making (see chart for details).  Pt given reassurance and support.   Chaplain asked to be with patient and family.  Pt remains very anxious. Xanax po for symptom improvement.   Reviewed nursing notes and prior charts for additional history.   Recheck bp improved, pt calmer, less distressed.     Final Clinical Impressions(s) / ED Diagnoses   Final diagnoses:  None    ED Discharge Orders    None       Lajean Saver, MD 06/18/17 1321

## 2017-06-17 NOTE — Discharge Instructions (Addendum)
It was our pleasure to provide your ER care today - we are very sorry for your loss.  Your blood pressure is high today -follow up with primary care doctor in the next 1-2 weeks.   Return to ER if worse, new symptoms, other concern.   You were given medication in the ER - no driving for the next 6 hours.

## 2017-06-17 NOTE — ED Notes (Signed)
E-Signature broken. Patient verbalizes understanding of discharge instructions and has no further questions at this time.

## 2017-08-28 ENCOUNTER — Other Ambulatory Visit: Payer: Self-pay | Admitting: Family Medicine

## 2017-08-28 DIAGNOSIS — Z1231 Encounter for screening mammogram for malignant neoplasm of breast: Secondary | ICD-10-CM

## 2017-09-23 ENCOUNTER — Ambulatory Visit
Admission: RE | Admit: 2017-09-23 | Discharge: 2017-09-23 | Disposition: A | Discharge status: Home / Self Care | Payer: Medicare Other | Source: Ambulatory Visit | Attending: Family Medicine | Admitting: Family Medicine

## 2017-09-23 DIAGNOSIS — Z1231 Encounter for screening mammogram for malignant neoplasm of breast: Secondary | ICD-10-CM

## 2018-07-08 DIAGNOSIS — D72820 Lymphocytosis (symptomatic): Secondary | ICD-10-CM | POA: Insufficient documentation

## 2018-08-25 ENCOUNTER — Other Ambulatory Visit: Payer: Self-pay | Admitting: Family Medicine

## 2018-08-25 DIAGNOSIS — Z1231 Encounter for screening mammogram for malignant neoplasm of breast: Secondary | ICD-10-CM

## 2018-10-11 ENCOUNTER — Other Ambulatory Visit: Payer: Self-pay

## 2018-10-11 ENCOUNTER — Ambulatory Visit
Admission: RE | Admit: 2018-10-11 | Discharge: 2018-10-11 | Disposition: A | Discharge status: Home / Self Care | Payer: Medicare Other | Source: Ambulatory Visit | Attending: Family Medicine | Admitting: Family Medicine

## 2018-10-11 DIAGNOSIS — Z1231 Encounter for screening mammogram for malignant neoplasm of breast: Secondary | ICD-10-CM

## 2018-11-10 ENCOUNTER — Ambulatory Visit (HOSPITAL_COMMUNITY)
Admission: EM | Admit: 2018-11-10 | Discharge: 2018-11-10 | Disposition: A | Discharge status: Home / Self Care | Payer: Medicare Other | Attending: Family Medicine | Admitting: Family Medicine

## 2018-11-10 ENCOUNTER — Other Ambulatory Visit: Payer: Self-pay

## 2018-11-10 DIAGNOSIS — Z20822 Contact with and (suspected) exposure to covid-19: Secondary | ICD-10-CM

## 2018-11-10 DIAGNOSIS — Z20828 Contact with and (suspected) exposure to other viral communicable diseases: Secondary | ICD-10-CM | POA: Diagnosis not present

## 2018-11-10 NOTE — ED Provider Notes (Signed)
[place]    CSN: [id number] Arrival date & time: 11/10/18  1016      History   Chief Complaint No chief complaint on file. COVID exposure  HPI Alyssa Armstrong is a 68 y.o. female  history of DM type II, hypertension, hyperlipidemia, presenting today for evaluation of COVID screening.  Patient states that she has had indirect COVID exposure through her spouse.  Spouse has been around coworker that has tested positive for COVID.  She has not had any symptoms.  Denies cough, congestion, sore throat, fevers, chest pain or shortness of breath.  Denies other known exposures.  HPI  Past Medical History:  Diagnosis Date  . Bronchitis   . Depression 75YRS AGO  . Diabetes mellitus without complication ([place])   . GERD (gastroesophageal reflux disease)   . Hypertension     Patient Active Problem List   Diagnosis Date Noted  . HTN (hypertension) 09/07/2012  . Dyslipidemia 09/07/2012  . PES PLANUS 02/28/2008    Past Surgical History:  Procedure Laterality Date  . ABDOMINAL HYSTERECTOMY     PARTIAL  . BUNIONECTOMY  2009  . ROTATOR CUFF REPAIR  2008  . rotator cuff suregery      OB History   No obstetric history on file.      Home Medications    Prior to Admission medications   Medication Sig Start Date End Date Taking? Authorizing Provider  atorvastatin (LIPITOR) 20 MG tablet Take 20 mg by mouth at bedtime. 10/11/15   [provider]  baclofen (LIORESAL) 10 MG tablet Take 1 tablet (10 mg total) by mouth 3 (three) times daily as needed for muscle spasms. 06/20/16   Patrecia Pour, MD  buPROPion (WELLBUTRIN XL) 150 MG 24 hr tablet Take 150 mg by mouth daily. 09/17/15 06/17/17  [provider]  cetirizine (ZYRTEC) 10 MG tablet Take 1 tablet (10 mg total) by mouth daily. Patient taking differently: Take 10 mg by mouth daily as needed for allergies.  09/07/12   Robbie Lis, MD  famotidine (PEPCID) 20 MG tablet Take 20 mg by mouth daily. 10/11/15    [provider]  ibuprofen (ADVIL,MOTRIN) 200 MG tablet Take 400 mg by mouth as needed for headache or moderate pain.    [provider]  losartan (COZAAR) 100 MG tablet Take 50 mg by mouth daily.  08/09/15   [provider]  Multiple Vitamins-Minerals (ICAPS AREDS 2 PO) Take 1 tablet by mouth daily.    [provider]  polyethylene glycol (MIRALAX / GLYCOLAX) packet Take 17 g by mouth once a week.    [provider]  venlafaxine (EFFEXOR) 75 MG tablet Take 75 mg by mouth 2 (two) times daily. 08/22/15   [provider]    Family History Family History  Problem Relation Age of Onset  . Diabetes Mother   . Heart disease Mother   . Diabetes Father   . Cancer Father        LUNG  . Hyperlipidemia Brother     Social History Social History   Tobacco Use  . Smoking status: Never Smoker  . Smokeless tobacco: Never Used  Substance Use Topics  . Alcohol use: Yes  . Drug use: No     Allergies   Vicodin [hydrocodone-acetaminophen] and Lisinopril   Review of Systems Review of Systems  Constitutional: Negative for activity change, appetite change, chills, fatigue and fever.  HENT: Negative for congestion, ear pain, rhinorrhea, sinus pressure, sore throat  and trouble swallowing.   Eyes: Negative for discharge and redness.  Respiratory: Negative for cough, chest tightness and shortness of breath.   Cardiovascular: Negative for chest pain.  Gastrointestinal: Negative for abdominal pain, diarrhea, nausea and vomiting.  Musculoskeletal: Negative for myalgias.  Skin: Negative for rash.  Neurological: Negative for dizziness, light-headedness and headaches.     Physical Exam Triage Vital Signs ED Triage Vitals [11/10/18 1045]  Enc Vitals Group     BP (!) 143/64     Pulse Rate 79     Resp 16     Temp 98.3 F (36.8 C)     Temp Source Temporal     SpO2 99 %     Weight      Height      Head Circumference      Peak Flow      Pain  Score      Pain Loc      Pain Edu?      Excl. in [place]?    No data found.  Updated Vital Signs BP (!) 143/64 (BP Location: Left Arm)   Pulse 79   Temp 98.3 F (36.8 C) (Temporal)   Resp 16   SpO2 99%   Visual Acuity Right Eye Distance:   Left Eye Distance:   Bilateral Distance:    Right Eye Near:   Left Eye Near:    Bilateral Near:     Physical Exam Vitals signs and nursing note reviewed.  Constitutional:      Appearance: She is well-developed.     Comments: No acute distress  HENT:     Head: Normocephalic and atraumatic.     Nose: Nose normal.  Eyes:     Conjunctiva/sclera: Conjunctivae normal.  Neck:     Musculoskeletal: Neck supple.  Cardiovascular:     Rate and Rhythm: Normal rate.  Pulmonary:     Effort: Pulmonary effort is normal. No respiratory distress.  Abdominal:     General: There is no distension.  Musculoskeletal: Normal range of motion.  Skin:    General: Skin is warm and dry.  Neurological:     Mental Status: She is alert and oriented to person, place, and time.      UC Treatments / Results  Labs (all labs ordered are listed, but only abnormal results are displayed) Labs Reviewed  NOVEL CORONAVIRUS, NAA (HOSP ORDER, SEND-OUT TO REF LAB; TAT 18-24 HRS)    EKG   Radiology No results found.  Procedures Procedures (including critical care time)  Medications Ordered in UC Medications - No data to display  Initial Impression / Assessment and Plan / UC Course  I have reviewed the triage vital signs and the nursing notes.  Pertinent labs & imaging results that were available during my care of the patient were reviewed by me and considered in my medical decision making (see chart for details).     COVID swab obtained, will call if positive.  Will have stay home from work until results return.Discussed strict return precautions. Patient verbalized understanding and is agreeable with plan.  Final Clinical Impressions(s) / UC Diagnoses    Final diagnoses:  Exposure to Covid-19 Virus     Discharge Instructions        Person Under Monitoring Name: Alyssa Armstrong  Location: [place] [address]   Infection Prevention Recommendations for Individuals Confirmed to have, or Being Evaluated for, 2019 Novel Coronavirus (COVID-19) Infection Who Receive Care at Home  Individuals who  are confirmed to have, or are being evaluated for, COVID-19 should follow the prevention steps below until a healthcare provider or local or state health department says they can return to normal activities.  Stay home except to get medical care You should restrict activities outside your home, except for getting medical care. Do not go to work, school, or public areas, and do not use public transportation or taxis.  Call ahead before visiting your doctor Before your medical appointment, call the healthcare provider and tell them that you have, or are being evaluated for, COVID-19 infection. This will help the healthcare provider's office take steps to keep other people from getting infected. Ask your healthcare provider to call the local or state health department.  Monitor your symptoms Seek prompt medical attention if your illness is worsening (e.g., difficulty breathing). Before going to your medical appointment, call the healthcare provider and tell them that you have, or are being evaluated for, COVID-19 infection. Ask your healthcare provider to call the local or state health department.  Wear a facemask You should wear a facemask that covers your nose and mouth when you are in the same room with other people and when you visit a healthcare provider. People who live with or visit you should also wear a facemask while they are in the same room with you.  Separate yourself from other people in your home As much as possible, you should stay in a different room from other people in your home. Also, you should use a  separate bathroom, if available.  Avoid sharing household items You should not share dishes, drinking glasses, cups, eating utensils, towels, bedding, or other items with other people in your home. After using these items, you should wash them thoroughly with soap and water.  Cover your coughs and sneezes Cover your mouth and nose with a tissue when you cough or sneeze, or you can cough or sneeze into your sleeve. Throw used tissues in a lined trash can, and immediately wash your hands with soap and water for at least 20 seconds or use an alcohol-based hand rub.  Wash your [facility] your hands often and thoroughly with soap and water for at least 20 seconds. You can use an alcohol-based hand sanitizer if soap and water are not available and if your hands are not visibly dirty. Avoid touching your eyes, nose, and mouth with unwashed hands.   Prevention Steps for Caregivers and Household Members of Individuals Confirmed to have, or Being Evaluated for, COVID-19 Infection Being Cared for in the Home  If you live with, or provide care at home for, a person confirmed to have, or being evaluated for, COVID-19 infection please follow these guidelines to prevent infection:  Follow healthcare provider's instructions Make sure that you understand and can help the patient follow any healthcare provider instructions for all care.  Provide for the patient's basic needs You should help the patient with basic needs in the home and provide support for getting groceries, prescriptions, and other personal needs.  Monitor the patient's symptoms If they are getting sicker, call his or her medical provider and tell them that the patient has, or is being evaluated for, COVID-19 infection. This will help the healthcare provider's office take steps to keep other people from getting infected. Ask the healthcare provider to call the local or state health department.  Limit the number of people who have  contact with the patient  If possible, have only one caregiver for the patient.  Other household members should stay in another home or place of residence. If this is not possible, they should stay  in another room, or be separated from the patient as much as possible. Use a separate bathroom, if available.  Restrict visitors who do not have an essential need to be in the home.  Keep older adults, very young children, and other sick people away from the patient Keep older adults, very young children, and those who have compromised immune systems or chronic health conditions away from the patient. This includes people with chronic heart, lung, or kidney conditions, diabetes, and cancer.  Ensure good ventilation Make sure that shared spaces in the home have good air flow, such as from an air conditioner or an opened window, weather permitting.  Wash your hands often  Wash your hands often and thoroughly with soap and water for at least 20 seconds. You can use an alcohol based hand sanitizer if soap and water are not available and if your hands are not visibly dirty.  Avoid touching your eyes, nose, and mouth with unwashed hands.  Use disposable paper towels to dry your hands. If not available, use dedicated cloth towels and replace them when they become wet.  Wear a facemask and gloves  Wear a disposable facemask at all times in the room and gloves when you touch or have contact with the patient's blood, body fluids, and/or secretions or excretions, such as sweat, saliva, sputum, nasal mucus, vomit, urine, or feces.  Ensure the mask fits over your nose and mouth tightly, and do not touch it during use.  Throw out disposable facemasks and gloves after using them. Do not reuse.  Wash your hands immediately after removing your facemask and gloves.  If your personal clothing becomes contaminated, carefully remove clothing and launder. Wash your hands after handling contaminated clothing.   Place all used disposable facemasks, gloves, and other waste in a lined container before disposing them with other household waste.  Remove gloves and wash your hands immediately after handling these items.  Do not share dishes, glasses, or other household items with the patient  Avoid sharing household items. You should not share dishes, drinking glasses, cups, eating utensils, towels, bedding, or other items with a patient who is confirmed to have, or being evaluated for, COVID-19 infection.  After the person uses these items, you should wash them thoroughly with soap and water.  Wash laundry thoroughly  Immediately remove and wash clothes or bedding that have blood, body fluids, and/or secretions or excretions, such as sweat, saliva, sputum, nasal mucus, vomit, urine, or feces, on them.  Wear gloves when handling laundry from the patient.  Read and follow directions on labels of laundry or clothing items and detergent. In general, wash and dry with the warmest temperatures recommended on the label.  Clean all areas the individual has used often  Clean all touchable surfaces, such as counters, tabletops, doorknobs, bathroom fixtures, toilets, phones, keyboards, tablets, and bedside tables, every day. Also, clean any surfaces that may have blood, body fluids, and/or secretions or excretions on them.  Wear gloves when cleaning surfaces the patient has come in contact with.  Use a diluted bleach solution (e.g., dilute bleach with 1 part bleach and 10 parts water) or a household disinfectant with a label that says EPA-registered for coronaviruses. To make a bleach solution at home, add 1 tablespoon of bleach to 1 quart (4 cups) of water. For a larger supply, add  cup of bleach  to 1 gallon (16 cups) of water.  Read labels of cleaning products and follow recommendations provided on product labels. Labels contain instructions for safe and effective use of the cleaning product including  precautions you should take when applying the product, such as wearing gloves or eye protection and making sure you have good ventilation during use of the product.  Remove gloves and wash hands immediately after cleaning.  Monitor yourself for signs and symptoms of illness Caregivers and household members are considered close contacts, should monitor their health, and will be asked to limit movement outside of the home to the extent possible. Follow the monitoring steps for close contacts listed on the symptom monitoring form.   ? If you have additional questions, contact your local health department or call the epidemiologist on call at [phone] (available 24/7). ? This guidance is subject to change. For the most up-to-date guidance from [facility], please refer to their website: [email or web address]    ED Prescriptions    None     PDMP not reviewed this encounter.   Janith Lima, [place] 11/10/18 1116

## 2018-11-10 NOTE — Discharge Instructions (Signed)
Person Under Monitoring Name: Alyssa Armstrong  Location: [address] [place] [address]   Infection Prevention Recommendations for Individuals Confirmed to have, or Being Evaluated for, 2019 Novel Coronavirus (COVID-19) Infection Who Receive Care at Home  Individuals who are confirmed to have, or are being evaluated for, COVID-19 should follow the prevention steps below until a healthcare provider or local or state health department says they can return to normal activities.  Stay home except to get medical care You should restrict activities outside your home, except for getting medical care. Do not go to work, school, or public areas, and do not use public transportation or taxis.  Call ahead before visiting your doctor Before your medical appointment, call the healthcare provider and tell them that you have, or are being evaluated for, COVID-19 infection. This will help the healthcare providers office take steps to keep other people from getting infected. Ask your healthcare provider to call the local or state health department.  Monitor your symptoms Seek prompt medical attention if your illness is worsening (e.g., difficulty breathing). Before going to your medical appointment, call the healthcare provider and tell them that you have, or are being evaluated for, COVID-19 infection. Ask your healthcare provider to call the local or state health department.  Wear a facemask You should wear a facemask that covers your nose and mouth when you are in the same room with other people and when you visit a healthcare provider. People who live with or visit you should also wear a facemask while they are in the same room with you.  Separate yourself from other people in your home As much as possible, you should stay in a different room from other people in your home. Also, you should use a separate bathroom, if available.  Avoid sharing household items You should not  share dishes, drinking glasses, cups, eating utensils, towels, bedding, or other items with other people in your home. After using these items, you should wash them thoroughly with soap and water.  Cover your coughs and sneezes Cover your mouth and nose with a tissue when you cough or sneeze, or you can cough or sneeze into your sleeve. Throw used tissues in a lined trash can, and immediately wash your hands with soap and water for at least 20 seconds or use an alcohol-based hand rub.  Wash your [facility] your hands often and thoroughly with soap and water for at least 20 seconds. You can use an alcohol-based hand sanitizer if soap and water are not available and if your hands are not visibly dirty. Avoid touching your eyes, nose, and mouth with unwashed hands.   Prevention Steps for Caregivers and Household Members of Individuals Confirmed to have, or Being Evaluated for, COVID-19 Infection Being Cared for in the Home  If you live with, or provide care at home for, a person confirmed to have, or being evaluated for, COVID-19 infection please follow these guidelines to prevent infection:  Follow healthcare providers instructions Make sure that you understand and can help the patient follow any healthcare provider instructions for all care.  Provide for the patients basic needs You should help the patient with basic needs in the home and provide support for getting groceries, prescriptions, and other personal needs.  Monitor the patients symptoms If they are getting sicker, call his or her medical provider and tell them that the patient has, or is being evaluated for, COVID-19 infection. This will help the healthcare providers office  take steps to keep other people from getting infected. Ask the healthcare provider to call the local or state health department.  Limit the number of people who have contact with the patient If possible, have only one caregiver for the  patient. Other household members should stay in another home or place of residence. If this is not possible, they should stay in another room, or be separated from the patient as much as possible. Use a separate bathroom, if available. Restrict visitors who do not have an essential need to be in the home.  Keep older adults, very young children, and other sick people away from the patient Keep older adults, very young children, and those who have compromised immune systems or chronic health conditions away from the patient. This includes people with chronic heart, lung, or kidney conditions, diabetes, and cancer.  Ensure good ventilation Make sure that shared spaces in the home have good air flow, such as from an air conditioner or an opened window, weather permitting.  Wash your hands often Wash your hands often and thoroughly with soap and water for at least 20 seconds. You can use an alcohol based hand sanitizer if soap and water are not available and if your hands are not visibly dirty. Avoid touching your eyes, nose, and mouth with unwashed hands. Use disposable paper towels to dry your hands. If not available, use dedicated cloth towels and replace them when they become wet.  Wear a facemask and gloves Wear a disposable facemask at all times in the room and gloves when you touch or have contact with the patients blood, body fluids, and/or secretions or excretions, such as sweat, saliva, sputum, nasal mucus, vomit, urine, or feces.  Ensure the mask fits over your nose and mouth tightly, and do not touch it during use. Throw out disposable facemasks and gloves after using them. Do not reuse. Wash your hands immediately after removing your facemask and gloves. If your personal clothing becomes contaminated, carefully remove clothing and launder. Wash your hands after handling contaminated clothing. Place all used disposable facemasks, gloves, and other waste in a lined container before  disposing them with other household waste. Remove gloves and wash your hands immediately after handling these items.  Do not share dishes, glasses, or other household items with the patient Avoid sharing household items. You should not share dishes, drinking glasses, cups, eating utensils, towels, bedding, or other items with a patient who is confirmed to have, or being evaluated for, COVID-19 infection. After the person uses these items, you should wash them thoroughly with soap and water.  Wash laundry thoroughly Immediately remove and wash clothes or bedding that have blood, body fluids, and/or secretions or excretions, such as sweat, saliva, sputum, nasal mucus, vomit, urine, or feces, on them. Wear gloves when handling laundry from the patient. Read and follow directions on labels of laundry or clothing items and detergent. In general, wash and dry with the warmest temperatures recommended on the label.  Clean all areas the individual has used often Clean all touchable surfaces, such as counters, tabletops, doorknobs, bathroom fixtures, toilets, phones, keyboards, tablets, and bedside tables, every day. Also, clean any surfaces that may have blood, body fluids, and/or secretions or excretions on them. Wear gloves when cleaning surfaces the patient has come in contact with. Use a diluted bleach solution (e.g., dilute bleach with 1 part bleach and 10 parts water) or a household disinfectant with a label that says EPA-registered for coronaviruses. To make a bleach  solution at home, add 1 tablespoon of bleach to 1 quart (4 cups) of water. For a larger supply, add  cup of bleach to 1 gallon (16 cups) of water. Read labels of cleaning products and follow recommendations provided on product labels. Labels contain instructions for safe and effective use of the cleaning product including precautions you should take when applying the product, such as wearing gloves or eye protection and making sure you  have good ventilation during use of the product. Remove gloves and wash hands immediately after cleaning.  Monitor yourself for signs and symptoms of illness Caregivers and household members are considered close contacts, should monitor their health, and will be asked to limit movement outside of the home to the extent possible. Follow the monitoring steps for close contacts listed on the symptom monitoring form.   ? If you have additional questions, contact your local health department or call the epidemiologist on call at [phone] (available 24/7). ? This guidance is subject to change. For the most up-to-date guidance from [facility], please refer to their website: [email or web address]

## 2018-11-11 LAB — NOVEL CORONAVIRUS, NAA (HOSP ORDER, SEND-OUT TO REF LAB; TAT 18-24 HRS): SARS-CoV-2, NAA: NOT DETECTED

## 2019-04-11 DIAGNOSIS — Z79899 Other long term (current) drug therapy: Secondary | ICD-10-CM | POA: Insufficient documentation

## 2019-09-06 ENCOUNTER — Other Ambulatory Visit: Payer: Self-pay

## 2019-09-06 ENCOUNTER — Emergency Department (HOSPITAL_COMMUNITY)
Admission: EM | Admit: 2019-09-06 | Discharge: 2019-09-06 | Disposition: A | Discharge status: Home / Self Care | Payer: Medicare Other | Attending: Emergency Medicine | Admitting: Emergency Medicine

## 2019-09-06 ENCOUNTER — Emergency Department (HOSPITAL_COMMUNITY): Payer: Medicare Other

## 2019-09-06 ENCOUNTER — Encounter (HOSPITAL_COMMUNITY): Payer: Self-pay

## 2019-09-06 DIAGNOSIS — Q899 Congenital malformation, unspecified: Secondary | ICD-10-CM

## 2019-09-06 DIAGNOSIS — W010XXA Fall on same level from slipping, tripping and stumbling without subsequent striking against object, initial encounter: Secondary | ICD-10-CM | POA: Diagnosis not present

## 2019-09-06 DIAGNOSIS — S99911A Unspecified injury of right ankle, initial encounter: Secondary | ICD-10-CM | POA: Diagnosis present

## 2019-09-06 DIAGNOSIS — Z20822 Contact with and (suspected) exposure to covid-19: Secondary | ICD-10-CM | POA: Insufficient documentation

## 2019-09-06 DIAGNOSIS — I10 Essential (primary) hypertension: Secondary | ICD-10-CM | POA: Diagnosis not present

## 2019-09-06 DIAGNOSIS — Y92017 Garden or yard in single-family (private) house as the place of occurrence of the external cause: Secondary | ICD-10-CM | POA: Diagnosis not present

## 2019-09-06 DIAGNOSIS — Q7291 Unspecified reduction defect of right lower limb: Secondary | ICD-10-CM | POA: Insufficient documentation

## 2019-09-06 DIAGNOSIS — Y93K9 Activity, other involving animal care: Secondary | ICD-10-CM | POA: Diagnosis not present

## 2019-09-06 DIAGNOSIS — Y998 Other external cause status: Secondary | ICD-10-CM | POA: Insufficient documentation

## 2019-09-06 DIAGNOSIS — S82891A Other fracture of right lower leg, initial encounter for closed fracture: Secondary | ICD-10-CM | POA: Insufficient documentation

## 2019-09-06 DIAGNOSIS — E119 Type 2 diabetes mellitus without complications: Secondary | ICD-10-CM | POA: Insufficient documentation

## 2019-09-06 LAB — BASIC METABOLIC PANEL
Anion gap: 9 (ref 5–15)
BUN: 15 mg/dL (ref 8–23)
CO2: 28 mmol/L (ref 22–32)
Calcium: 8.8 mg/dL — ABNORMAL LOW (ref 8.9–10.3)
Chloride: 107 mmol/L (ref 98–111)
Creatinine, Ser: 0.94 mg/dL (ref 0.44–1.00)
GFR calc Af Amer: >60 mL/min (ref >60)
GFR calc non Af Amer: >60 mL/min (ref >60)
Glucose, Bld: 116 mg/dL — ABNORMAL HIGH (ref 70–99)
Potassium: 4.3 mmol/L (ref 3.5–5.1)
Sodium: 144 mmol/L (ref 135–145)

## 2019-09-06 LAB — CBC WITH DIFFERENTIAL/PLATELET
Abs Immature Granulocytes: 0.01 10*3/uL (ref 0.00–0.07)
Basophils Absolute: 0.1 10*3/uL (ref 0.0–0.1)
Basophils Relative: 1 %
Eosinophils Absolute: 0.1 10*3/uL (ref 0.0–0.5)
Eosinophils Relative: 2 %
HCT: 37.4 % (ref 36.0–46.0)
Hemoglobin: 12.2 g/dL (ref 12.0–15.0)
Immature Granulocytes: 0 %
Lymphocytes Relative: 41 %
Lymphs Abs: 2.9 10*3/uL (ref 0.7–4.0)
MCH: 25.4 pg — ABNORMAL LOW (ref 26.0–34.0)
MCHC: 32.6 g/dL (ref 30.0–36.0)
MCV: 77.9 fL — ABNORMAL LOW (ref 80.0–100.0)
Monocytes Absolute: 0.7 10*3/uL (ref 0.1–1.0)
Monocytes Relative: 10 %
Neutro Abs: 3.3 10*3/uL (ref 1.7–7.7)
Neutrophils Relative %: 46 %
Platelets: 245 10*3/uL (ref 150–400)
RBC: 4.8 MIL/uL (ref 3.87–5.11)
RDW: 15.7 % — ABNORMAL HIGH (ref 11.5–15.5)
WBC: 7.1 10*3/uL (ref 4.0–10.5)
nRBC: 0 % (ref 0.0–0.2)

## 2019-09-06 LAB — CBG MONITORING, ED: Glucose-Capillary: 118 mg/dL — ABNORMAL HIGH (ref 70–99)

## 2019-09-06 LAB — SARS CORONAVIRUS 2 BY RT PCR (HOSPITAL ORDER, PERFORMED IN ~~LOC~~ HOSPITAL LAB): SARS Coronavirus 2: NEGATIVE

## 2019-09-06 MED ORDER — FENTANYL CITRATE (PF) 100 MCG/2ML IJ SOLN
100.0000 ug | Freq: Once | INTRAMUSCULAR | Status: AC
  Administered 2019-09-06: 100 ug via INTRAVENOUS
  Filled 2019-09-06: qty 2

## 2019-09-06 MED ORDER — PROPOFOL 10 MG/ML IV BOLUS
0.5000 mg/kg | Freq: Once | INTRAVENOUS | Status: AC
  Administered 2019-09-06: 44 mg via INTRAVENOUS
  Filled 2019-09-06: qty 20

## 2019-09-06 MED ORDER — OXYCODONE HCL 5 MG PO TABS: 5.0000 mg | ORAL_TABLET | Freq: Four times a day (QID) | ORAL | 0 refills | Status: DC | PRN

## 2019-09-06 MED ORDER — PROPOFOL 10 MG/ML IV BOLUS
INTRAVENOUS | Status: AC | PRN
  Administered 2019-09-06: 40 mg via INTRAVENOUS
  Administered 2019-09-06: 20 mg via INTRAVENOUS
  Administered 2019-09-06: 44 mg via INTRAVENOUS

## 2019-09-06 NOTE — ED Provider Notes (Signed)
[facility] DEPT Provider Note   CSN: [id number] Arrival date & time: 09/06/19  1650     History Chief Complaint  Patient presents with  . Right Ankle Injury    Alyssa Armstrong is a 69 y.o. female with history of prediabetes, hypertension presents to ER for evaluation of right ankle pain with deformity. Patient was outside with her dogs and trying to separate them.  Her sister unfortunately fell down to the ground but landed on her ankle and lower leg. She heard a snap followed by severe pain and deformity.  Denies distal tingling or loss of sensation or coolness. Denies any other injuries.  No OAC.   HPI     Past Medical History:  Diagnosis Date  . Bronchitis   . Depression 38YRS AGO  . Diabetes mellitus without complication ([place])   . GERD (gastroesophageal reflux disease)   . Hypertension     Patient Active Problem List   Diagnosis Date Noted  . HTN (hypertension) 09/07/2012  . Dyslipidemia 09/07/2012  . PES PLANUS 02/28/2008    Past Surgical History:  Procedure Laterality Date  . ABDOMINAL HYSTERECTOMY     PARTIAL  . BUNIONECTOMY  2009  . ROTATOR CUFF REPAIR  2008  . rotator cuff suregery       OB History   No obstetric history on file.     Family History  Problem Relation Age of Onset  . Diabetes Mother   . Heart disease Mother   . Diabetes Father   . Cancer Father        LUNG  . Hyperlipidemia Brother     Social History   Tobacco Use  . Smoking status: Never Smoker  . Smokeless tobacco: Never Used  Substance Use Topics  . Alcohol use: Yes  . Drug use: No    Home Medications Prior to Admission medications   Medication Sig Start Date End Date Taking? Authorizing Provider  atorvastatin (LIPITOR) 20 MG tablet Take 20 mg by mouth at bedtime. 10/11/15   [provider]  baclofen (LIORESAL) 10 MG tablet Take 1 tablet (10 mg total) by mouth 3 (three) times daily as needed for muscle spasms. 06/20/16    Patrecia Pour, MD  buPROPion (WELLBUTRIN XL) 150 MG 24 hr tablet Take 150 mg by mouth daily. 09/17/15 06/17/17  [provider]  cetirizine (ZYRTEC) 10 MG tablet Take 1 tablet (10 mg total) by mouth daily. Patient taking differently: Take 10 mg by mouth daily as needed for allergies.  09/07/12   Robbie Lis, MD  famotidine (PEPCID) 20 MG tablet Take 20 mg by mouth daily. 10/11/15   [provider]  ibuprofen (ADVIL,MOTRIN) 200 MG tablet Take 400 mg by mouth as needed for headache or moderate pain.    [provider]  losartan (COZAAR) 100 MG tablet Take 50 mg by mouth daily.  08/09/15   [provider]  Multiple Vitamins-Minerals (ICAPS AREDS 2 PO) Take 1 tablet by mouth daily.    [provider]  oxyCODONE (OXY IR/ROXICODONE) 5 MG immediate release tablet Take 1 tablet (5 mg total) by mouth every 6 (six) hours as needed for up to 3 days for severe pain. 09/06/19 09/09/19  Kinnie Feil, PA-C  polyethylene glycol (MIRALAX / GLYCOLAX) packet Take 17 g by mouth once a week.    [provider]  venlafaxine (EFFEXOR) 75 MG tablet Take 75 mg by mouth 2 (two) times daily. 08/22/15   [provider]    Allergies    Vicodin [hydrocodone-acetaminophen] and Lisinopril  Review of Systems   Review of Systems  Musculoskeletal: Positive for arthralgias, gait problem and joint swelling.  All other systems reviewed and are negative.   Physical Exam Updated Vital Signs BP 97/84   Pulse 75   Temp 97.9 F (36.6 C) (Oral)   Resp 15   Ht 5\' 4"  (1.626 m)   Wt 88 kg   SpO2 100%   BMI 33.30 kg/m   Physical Exam Constitutional:      Appearance: She is well-developed.  HENT:     Head: Normocephalic.     Nose: Nose normal.  Eyes:     General: Lids are normal.  Cardiovascular:     Rate and Rhythm: Normal rate.     Comments: 1+ DP pulse RLE. Toes warm with < 2 cap refill  Pulmonary:     Effort: Pulmonary effort is normal. No respiratory  distress.  Musculoskeletal:        General: Normal range of motion.     Cervical back: Normal range of motion.     Comments: Obvious deformity of right distal tib/tib and ankle. Right foot is held in external rotation with lateral displacement. Patient able to wiggle toes.  No tenderness right knee.   Skin:    Comments: Moderate amount of skin tenting over right ankle/distal tib/fib. Skin is intact   Neurological:     Mental Status: She is alert.     Comments: Sensation to light touch intact in right toes  Psychiatric:        Behavior: Behavior normal.       ED Results / Procedures / Treatments   Labs (all labs ordered are listed, but only abnormal results are displayed) Labs Reviewed  CBG MONITORING, ED - Abnormal; Notable for the following components:      Result Value   Glucose-Capillary 118 (*)    All other components within normal limits  SARS CORONAVIRUS 2 BY RT PCR (HOSPITAL ORDER, [facility])  CBC WITH DIFFERENTIAL/PLATELET  BASIC METABOLIC PANEL    EKG None  Radiology DG Tibia/Fibula Right  Result Date: 09/06/2019 CLINICAL DATA:  Fall with obvious deformity EXAM: RIGHT TIBIA AND FIBULA - 2 VIEW COMPARISON:  None. FINDINGS: Moderate plantar calcaneal spur. No acute displaced fracture involving the proximal to mid tibia or fibula. Acute comminuted distal fibular fracture with moderate lateral angulation and close to 1 shaft diameter lateral displacement of the talar dome with respect to the distal tibia. Acute comminuted medial malleolar fracture. IMPRESSION: Acute comminuted distal fibular and medial malleolar fractures with lateral displacement and angulation of the talar dome with respect to the distal tibia. Electronically Signed   By: Donavan Foil M.D.   On: 09/06/2019 18:21   DG Ankle 2 Views Right  Result Date: 09/06/2019 CLINICAL DATA:  Fall with deformity EXAM: RIGHT ANKLE - 2 VIEW COMPARISON:  None. FINDINGS: Acute comminuted medial  malleolar fracture. Acute comminuted distal fibular fracture with greater than 1 shaft diameter lateral displacement of distal fibular fracture fragment. Moderate lateral angulation of distal fibular fracture fragment as well. There is lateral dislocation of the talar dome by about 1 shaft diameter with respect to the distal tibia. There is widening of the anterior ankle joint with mild posterior positioning of the talar dome with respect to the distal tibia. Limited assessment for posterior malleolar fracture due to osseous overlap. IMPRESSION: Acute displaced and angulated distal fibular  fracture with additional medial malleolar fracture. There is lateral and posterior displacement of the talar dome with respect to the distal tibia. Electronically Signed   By: Donavan Foil M.D.   On: 09/06/2019 18:26    Procedures Reduction of fracture  Date/Time: 09/06/2019 9:06 PM Performed by: Kinnie Feil, PA-C Authorized by: Kinnie Feil, PA-C  Local anesthesia used: no  Anesthesia: Local anesthesia used: no  Sedation: Patient sedated: yes Sedation type: moderate (conscious) sedation Sedatives: propofol (per EDP Pickering at bedside) Analgesia: fentanyl  Patient tolerance: patient tolerated the procedure well with no immediate complications Comments: Pending post reduction xray. Patient able to wiggle toes after reduction/splint.  DP 1+.     (including critical care time)  Medications Ordered in ED Medications  fentaNYL (SUBLIMAZE) injection 100 mcg (100 mcg Intravenous Given 09/06/19 1856)  propofol (DIPRIVAN) 10 mg/mL bolus/IV push 44 mg (44 mg Intravenous Given 09/06/19 2101)  propofol (DIPRIVAN) 10 mg/mL bolus/IV push (20 mg Intravenous Given 09/06/19 2058)    ED Course  I have reviewed the triage vital signs and the nursing notes.  Pertinent labs & imaging results that were available during my care of the patient were reviewed by me and considered in my medical decision making  (see chart for details).  Clinical Course as of Sep 05 2108  Tue Sep 06, 2019  0998 FINDINGS: Acute comminuted medial malleolar fracture. Acute comminuted distal fibular fracture with greater than 1 shaft diameter lateral displacement of distal fibular fracture fragment. Moderate lateral angulation of distal fibular fracture fragment as well. There is lateral dislocation of the talar dome by about 1 shaft diameter with respect to the distal tibia. There is widening of the anterior ankle joint with mild posterior positioning of the talar dome with respect to the distal tibia. Limited assessment for posterior malleolar fracture due to osseous overlap.  IMPRESSION: Acute displaced and angulated distal fibular fracture with additional medial malleolar fracture. There is lateral and posterior displacement of the talar dome with respect to the distal tibia.  DG Ankle 2 Views Right [CG]  2025 Significant delay in conscious sedation/reduction. Unfortunately another patient in ER coded several times and only one RT available    [CG]    Clinical Course User Index [CG] Kinnie Feil, PA-C   MDM Rules/Calculators/A&P                          69 year old female presents after a mechanical fall with obvious right ankle deformity.  Pulses, sensation and movements of toes intact on initial exam.  Denies any other physical injuries after the fall.  No signs of more proximal joint involvement.  I have ordered x-rays, fentanyl.  X-rays personally visualized and interpreted, they show bimalleolar fracture.  Talar dome not very well visualized.  I spoke to Dr. Lorin Mercy with orthopedics who recommends conscious sedation and reduction in the ED.  If improved alignment on postreduction x-rays, recommends discharge with crutches, pain control.  Patient is to call his office tomorrow morning and surgery will likely be tomorrow.  Conscious sedation by EDP Pickering and reduction by me under his direct  supervision.  Patient had intact toe movement and DP pulses in the right lower extremity after reduction and splinting.  Pain improved.  No immediate complications after sedation and reduction.  2112: Patient will be handed off to Dr. Alvino Chapel who will follow up on postreduction x-rays and discharge.  Oxycodone 5 mg #12 sent to pharmacy.  Final Clinical Impression(s) / ED Diagnoses Final diagnoses:  Reduction defect of right lower extremity  Closed fracture of right ankle, initial encounter    Rx / DC Orders ED Discharge Orders         Ordered    oxyCODONE (OXY IR/ROXICODONE) 5 MG immediate release tablet  Every 6 hours PRN     Discontinue  Reprint     09/06/19 2109           Kinnie Feil, PA-C 09/06/19 2113    Davonna Belling, MD 09/07/19 0028

## 2019-09-06 NOTE — ED Triage Notes (Signed)
Pt BIB EMS from home, pt lives with family. Pt fell on right ankle and right leg. Pt family stated it snapped when pt fell, pt sister tripped over dog and fell on pt. EMS reports bilateral pedal pulses.  200 mcg fentanyl given by EMS total 20G R AC  122/84 HR 88 100% RA

## 2019-09-06 NOTE — Progress Notes (Signed)
Called to pt bedside for conscious sedation procedure.  Pt tolerated procedure well without incident.  ETCO2 in place with a reading of 40.  HR82, rr15, spo2 100% on 2lnc.

## 2019-09-06 NOTE — ED Notes (Signed)
Pt A&Ox4 and ambulatory with crutches. Pt aware she is nonweightbearing to right ankle.  An After Visit Summary was printed and given to the patient. Discharge instructions given and no further questions at this time.  Pt leaving with daughter. Pt escorted to pts daughter vehicle.

## 2019-09-06 NOTE — Discharge Instructions (Addendum)
You had a fracture and dislocation  This was realigned in the ER  Please call Dr Lorin Mercy office TOMORROW 7/21 in the morning.  His assistant will walk you through the process of scheduling surgery.  Dr Lorin Mercy may be able to do the procedure tomorrow.  You may have clear liquids for breakfast.  Take oxycodone 5 mg every 6 hours for pain. Elevate your leg through the night.  Keep the splint clean and dry.  Can add [phone] mg acetaminophen every 6 hours for more pain control  Return for sudden onset worsening pain inside the splint, loss of sensation or coolness or discoloration to your toe

## 2019-09-07 ENCOUNTER — Inpatient Hospital Stay (HOSPITAL_COMMUNITY): Payer: Medicare Other

## 2019-09-07 ENCOUNTER — Encounter: Payer: Self-pay | Admitting: Orthopaedic Surgery

## 2019-09-07 ENCOUNTER — Inpatient Hospital Stay (HOSPITAL_COMMUNITY): Payer: Medicare Other | Admitting: Anesthesiology

## 2019-09-07 ENCOUNTER — Encounter (HOSPITAL_COMMUNITY)
Admission: RE | Disposition: A | Discharge status: Home / Self Care | Payer: Self-pay | Source: Ambulatory Visit | Attending: Orthopaedic Surgery

## 2019-09-07 ENCOUNTER — Encounter (HOSPITAL_COMMUNITY): Payer: Self-pay | Admitting: Orthopaedic Surgery

## 2019-09-07 ENCOUNTER — Ambulatory Visit (INDEPENDENT_AMBULATORY_CARE_PROVIDER_SITE_OTHER): Payer: Medicare Other | Admitting: Orthopaedic Surgery

## 2019-09-07 ENCOUNTER — Other Ambulatory Visit: Payer: Self-pay

## 2019-09-07 ENCOUNTER — Observation Stay (HOSPITAL_COMMUNITY)
Admission: RE | Admit: 2019-09-07 | Discharge: 2019-09-08 | Disposition: A | Discharge status: Home / Self Care | Payer: Medicare Other | Source: Ambulatory Visit | Attending: Orthopaedic Surgery | Admitting: Orthopaedic Surgery

## 2019-09-07 DIAGNOSIS — I10 Essential (primary) hypertension: Secondary | ICD-10-CM | POA: Insufficient documentation

## 2019-09-07 DIAGNOSIS — S82851A Displaced trimalleolar fracture of right lower leg, initial encounter for closed fracture: Secondary | ICD-10-CM | POA: Diagnosis not present

## 2019-09-07 DIAGNOSIS — E119 Type 2 diabetes mellitus without complications: Secondary | ICD-10-CM | POA: Insufficient documentation

## 2019-09-07 DIAGNOSIS — W500XXA Accidental hit or strike by another person, initial encounter: Secondary | ICD-10-CM | POA: Insufficient documentation

## 2019-09-07 DIAGNOSIS — Y999 Unspecified external cause status: Secondary | ICD-10-CM | POA: Insufficient documentation

## 2019-09-07 DIAGNOSIS — Y939 Activity, unspecified: Secondary | ICD-10-CM | POA: Diagnosis not present

## 2019-09-07 DIAGNOSIS — S82853A Displaced trimalleolar fracture of unspecified lower leg, initial encounter for closed fracture: Secondary | ICD-10-CM | POA: Diagnosis present

## 2019-09-07 DIAGNOSIS — Z79899 Other long term (current) drug therapy: Secondary | ICD-10-CM | POA: Diagnosis not present

## 2019-09-07 DIAGNOSIS — S82891A Other fracture of right lower leg, initial encounter for closed fracture: Secondary | ICD-10-CM | POA: Diagnosis present

## 2019-09-07 DIAGNOSIS — Y929 Unspecified place or not applicable: Secondary | ICD-10-CM | POA: Insufficient documentation

## 2019-09-07 DIAGNOSIS — Z7982 Long term (current) use of aspirin: Secondary | ICD-10-CM | POA: Insufficient documentation

## 2019-09-07 DIAGNOSIS — Z419 Encounter for procedure for purposes other than remedying health state, unspecified: Secondary | ICD-10-CM

## 2019-09-07 HISTORY — PX: ORIF ANKLE FRACTURE: SHX5408

## 2019-09-07 LAB — GLUCOSE, CAPILLARY
Glucose-Capillary: 123 mg/dL — ABNORMAL HIGH (ref 70–99)
Glucose-Capillary: 163 mg/dL — ABNORMAL HIGH (ref 70–99)

## 2019-09-07 SURGERY — OPEN REDUCTION INTERNAL FIXATION (ORIF) ANKLE FRACTURE
Anesthesia: General | Site: Ankle | Laterality: Bilateral

## 2019-09-07 MED ORDER — POLYETHYLENE GLYCOL 3350 17 G PO PACK: 17.0000 g | PACK | Freq: Every day | ORAL | Status: DC | PRN

## 2019-09-07 MED ORDER — CEFAZOLIN SODIUM-DEXTROSE 2-3 GM-%(50ML) IV SOLR
INTRAVENOUS | Status: DC | PRN
Start: 2019-09-07 — End: 2019-09-07
  Administered 2019-09-07: 2 g via INTRAVENOUS

## 2019-09-07 MED ORDER — LIDOCAINE 2% (20 MG/ML) 5 ML SYRINGE
INTRAMUSCULAR | Status: DC | PRN
  Administered 2019-09-07: 60 mg via INTRAVENOUS

## 2019-09-07 MED ORDER — LACTATED RINGERS IV SOLN: INTRAVENOUS | Status: DC

## 2019-09-07 MED ORDER — BUPIVACAINE-EPINEPHRINE (PF) 0.5% -1:200000 IJ SOLN
INTRAMUSCULAR | Status: DC | PRN
  Administered 2019-09-07: 10 mL via PERINEURAL
  Administered 2019-09-07: 30 mL via PERINEURAL

## 2019-09-07 MED ORDER — VENLAFAXINE HCL 75 MG PO TABS
75.0000 mg | ORAL_TABLET | Freq: Two times a day (BID) | ORAL | Status: DC
  Administered 2019-09-07 – 2019-09-08 (×2): 75 mg via ORAL
  Filled 2019-09-07 (×3): qty 1

## 2019-09-07 MED ORDER — FENTANYL CITRATE (PF) 100 MCG/2ML IJ SOLN
50.0000 ug | INTRAMUSCULAR | Status: DC
  Administered 2019-09-07: 50 ug via INTRAVENOUS
  Filled 2019-09-07: qty 2

## 2019-09-07 MED ORDER — LOSARTAN POTASSIUM 50 MG PO TABS
50.0000 mg | ORAL_TABLET | Freq: Every day | ORAL | Status: DC
  Administered 2019-09-07 – 2019-09-08 (×2): 50 mg via ORAL
  Filled 2019-09-07 (×2): qty 1

## 2019-09-07 MED ORDER — CALCIUM CARBONATE-VITAMIN D 500-200 MG-UNIT PO TABS
1.0000 | ORAL_TABLET | Freq: Every day | ORAL | Status: DC
  Administered 2019-09-07 – 2019-09-08 (×2): 1 via ORAL
  Filled 2019-09-07 (×2): qty 1

## 2019-09-07 MED ORDER — BUPIVACAINE HCL (PF) 0.5 % IJ SOLN
INTRAMUSCULAR | Status: DC | PRN
  Administered 2019-09-07: 10 mL

## 2019-09-07 MED ORDER — MIDAZOLAM HCL 2 MG/2ML IJ SOLN
1.0000 mg | INTRAMUSCULAR | Status: DC
  Administered 2019-09-07: 2 mg via INTRAVENOUS
  Filled 2019-09-07: qty 2

## 2019-09-07 MED ORDER — HYDROMORPHONE HCL 1 MG/ML IJ SOLN: 0.5000 mg | INTRAMUSCULAR | Status: DC | PRN

## 2019-09-07 MED ORDER — PROPOFOL 10 MG/ML IV BOLUS
INTRAVENOUS | Status: AC
  Filled 2019-09-07: qty 20

## 2019-09-07 MED ORDER — ACETAMINOPHEN 325 MG PO TABS: 325.0000 mg | ORAL_TABLET | Freq: Four times a day (QID) | ORAL | Status: DC | PRN

## 2019-09-07 MED ORDER — ONDANSETRON HCL 4 MG/2ML IJ SOLN
INTRAMUSCULAR | Status: AC
  Filled 2019-09-07: qty 2

## 2019-09-07 MED ORDER — VANCOMYCIN HCL IN DEXTROSE 1-5 GM/200ML-% IV SOLN
INTRAVENOUS | Status: AC
  Filled 2019-09-07: qty 200

## 2019-09-07 MED ORDER — ASPIRIN 325 MG PO TABS
325.0000 mg | ORAL_TABLET | Freq: Every day | ORAL | Status: DC
  Administered 2019-09-08: 325 mg via ORAL
  Filled 2019-09-07: qty 1

## 2019-09-07 MED ORDER — METOCLOPRAMIDE HCL 5 MG PO TABS: 5.0000 mg | ORAL_TABLET | Freq: Three times a day (TID) | ORAL | Status: DC | PRN

## 2019-09-07 MED ORDER — FAMOTIDINE 20 MG PO TABS
20.0000 mg | ORAL_TABLET | Freq: Every day | ORAL | Status: DC
  Administered 2019-09-07 – 2019-09-08 (×2): 20 mg via ORAL
  Filled 2019-09-07 (×2): qty 1

## 2019-09-07 MED ORDER — CLONAZEPAM 0.5 MG PO TABS
0.2500 mg | ORAL_TABLET | Freq: Two times a day (BID) | ORAL | Status: DC | PRN
  Filled 2019-09-07: qty 1

## 2019-09-07 MED ORDER — EZETIMIBE 10 MG PO TABS
10.0000 mg | ORAL_TABLET | Freq: Every day | ORAL | Status: DC
  Administered 2019-09-07 – 2019-09-08 (×2): 10 mg via ORAL
  Filled 2019-09-07 (×2): qty 1

## 2019-09-07 MED ORDER — FENTANYL CITRATE (PF) 100 MCG/2ML IJ SOLN
INTRAMUSCULAR | Status: DC | PRN
  Administered 2019-09-07: 50 ug via INTRAVENOUS

## 2019-09-07 MED ORDER — LORATADINE 10 MG PO TABS
10.0000 mg | ORAL_TABLET | Freq: Every day | ORAL | Status: DC
  Administered 2019-09-07 – 2019-09-08 (×2): 10 mg via ORAL
  Filled 2019-09-07 (×2): qty 1

## 2019-09-07 MED ORDER — BUPROPION HCL ER (XL) 150 MG PO TB24
150.0000 mg | ORAL_TABLET | Freq: Every day | ORAL | Status: DC
  Administered 2019-09-08: 150 mg via ORAL
  Filled 2019-09-07: qty 1

## 2019-09-07 MED ORDER — 0.9 % SODIUM CHLORIDE (POUR BTL) OPTIME
TOPICAL | Status: DC | PRN
  Administered 2019-09-07: 1000 mL

## 2019-09-07 MED ORDER — ATORVASTATIN CALCIUM 40 MG PO TABS
40.0000 mg | ORAL_TABLET | Freq: Every day | ORAL | Status: DC
  Administered 2019-09-07 – 2019-09-08 (×2): 40 mg via ORAL
  Filled 2019-09-07 (×2): qty 1

## 2019-09-07 MED ORDER — VITAMIN D (ERGOCALCIFEROL) 1.25 MG (50000 UNIT) PO CAPS: 50000.0000 [IU] | ORAL_CAPSULE | ORAL | Status: DC

## 2019-09-07 MED ORDER — LIDOCAINE 2% (20 MG/ML) 5 ML SYRINGE
INTRAMUSCULAR | Status: AC
  Filled 2019-09-07: qty 5

## 2019-09-07 MED ORDER — DEXAMETHASONE SODIUM PHOSPHATE 10 MG/ML IJ SOLN
INTRAMUSCULAR | Status: DC | PRN
  Administered 2019-09-07: 10 mg via INTRAVENOUS

## 2019-09-07 MED ORDER — ONDANSETRON HCL 4 MG/2ML IJ SOLN: 4.0000 mg | Freq: Four times a day (QID) | INTRAMUSCULAR | Status: DC | PRN

## 2019-09-07 MED ORDER — BUPIVACAINE HCL (PF) 0.5 % IJ SOLN
INTRAMUSCULAR | Status: AC
  Filled 2019-09-07: qty 30

## 2019-09-07 MED ORDER — SODIUM CHLORIDE 0.9 % IV SOLN: INTRAVENOUS | Status: DC

## 2019-09-07 MED ORDER — ONDANSETRON HCL 4 MG PO TABS: 4.0000 mg | ORAL_TABLET | Freq: Four times a day (QID) | ORAL | Status: DC | PRN

## 2019-09-07 MED ORDER — DOCUSATE SODIUM 100 MG PO CAPS
100.0000 mg | ORAL_CAPSULE | Freq: Two times a day (BID) | ORAL | Status: DC
  Administered 2019-09-07 – 2019-09-08 (×2): 100 mg via ORAL
  Filled 2019-09-07: qty 1

## 2019-09-07 MED ORDER — CLONIDINE HCL (ANALGESIA) 100 MCG/ML EP SOLN
EPIDURAL | Status: DC | PRN
  Administered 2019-09-07: 50 ug

## 2019-09-07 MED ORDER — OXYCODONE HCL 5 MG PO TABS
5.0000 mg | ORAL_TABLET | ORAL | Status: DC | PRN
  Administered 2019-09-07: 5 mg via ORAL
  Filled 2019-09-07: qty 1

## 2019-09-07 MED ORDER — DEXAMETHASONE SODIUM PHOSPHATE 10 MG/ML IJ SOLN
INTRAMUSCULAR | Status: AC
  Filled 2019-09-07: qty 1

## 2019-09-07 MED ORDER — FENTANYL CITRATE (PF) 100 MCG/2ML IJ SOLN: 25.0000 ug | INTRAMUSCULAR | Status: DC | PRN

## 2019-09-07 MED ORDER — PROPOFOL 10 MG/ML IV BOLUS
INTRAVENOUS | Status: DC | PRN
  Administered 2019-09-07: 140 mg via INTRAVENOUS
  Administered 2019-09-07: 40 mg via INTRAVENOUS

## 2019-09-07 MED ORDER — CEFAZOLIN SODIUM-DEXTROSE 2-4 GM/100ML-% IV SOLN
INTRAVENOUS | Status: AC
  Filled 2019-09-07: qty 100

## 2019-09-07 MED ORDER — ONDANSETRON HCL 4 MG/2ML IJ SOLN: 4.0000 mg | Freq: Once | INTRAMUSCULAR | Status: DC | PRN

## 2019-09-07 MED ORDER — METOCLOPRAMIDE HCL 5 MG/ML IJ SOLN: 5.0000 mg | Freq: Three times a day (TID) | INTRAMUSCULAR | Status: DC | PRN

## 2019-09-07 SURGICAL SUPPLY — 46 items
BAG SPEC THK2 15X12 ZIP CLS (MISCELLANEOUS) ×1
BAG ZIPLOCK 12X15 (MISCELLANEOUS) ×3 IMPLANT
BIT DRILL 2.5X2.75 QC CALB (BIT) ×2 IMPLANT
BIT DRILL 2.9 CANN QC NONSTRL (BIT) ×2 IMPLANT
BNDG ELASTIC 6X5.8 VLCR STR LF (GAUZE/BANDAGES/DRESSINGS) ×2 IMPLANT
COVER SURGICAL LIGHT HANDLE (MISCELLANEOUS) ×3 IMPLANT
COVER WAND RF STERILE (DRAPES) IMPLANT
CUFF TOURN SGL QUICK 34 (TOURNIQUET CUFF) ×3
CUFF TRNQT CYL 34X4.125X (TOURNIQUET CUFF) ×1 IMPLANT
DECANTER SPIKE VIAL GLASS SM (MISCELLANEOUS) ×3 IMPLANT
DRAPE C-ARM 42X120 X-RAY (DRAPES) ×3 IMPLANT
DRAPE U-SHAPE 47X51 STRL (DRAPES) ×3 IMPLANT
DRSG ADAPTIC 3X8 NADH LF (GAUZE/BANDAGES/DRESSINGS) ×3 IMPLANT
DRSG PAD ABDOMINAL 8X10 ST (GAUZE/BANDAGES/DRESSINGS) ×5 IMPLANT
ELECT REM PT RETURN 15FT ADLT (MISCELLANEOUS) ×3 IMPLANT
GAUZE SPONGE 4X4 12PLY STRL (GAUZE/BANDAGES/DRESSINGS) ×3 IMPLANT
GAUZE XEROFORM 1X8 LF (GAUZE/BANDAGES/DRESSINGS) ×2 IMPLANT
GLOVE ORTHO TXT STRL SZ7.5 (GLOVE) ×3 IMPLANT
GOWN STRL REUS W/TWL LRG LVL3 (GOWN DISPOSABLE) ×3 IMPLANT
K-WIRE ACE 1.6X6 (WIRE) ×6
KIT TURNOVER KIT A (KITS) IMPLANT
KWIRE ACE 1.6X6 (WIRE) IMPLANT
NEEDLE HYPO 22GX1.5 SAFETY (NEEDLE) ×3 IMPLANT
NS IRRIG 1000ML POUR BTL (IV SOLUTION) ×2 IMPLANT
PACK TOTAL JOINT (CUSTOM PROCEDURE TRAY) ×3 IMPLANT
PAD CAST 4YDX4 CTTN HI CHSV (CAST SUPPLIES) ×2 IMPLANT
PADDING CAST ABS 4INX4YD NS (CAST SUPPLIES) ×6
PADDING CAST ABS 6INX4YD NS (CAST SUPPLIES) ×8
PADDING CAST ABS COTTON 4X4 ST (CAST SUPPLIES) IMPLANT
PADDING CAST ABS COTTON 6X4 NS (CAST SUPPLIES) IMPLANT
PADDING CAST COTTON 4X4 STRL (CAST SUPPLIES) ×6
PENCIL SMOKE EVACUATOR (MISCELLANEOUS) IMPLANT
PLATE LOCK 8H 103 BILAT FIB (Plate) ×2 IMPLANT
PROTECTOR NERVE ULNAR (MISCELLANEOUS) ×3 IMPLANT
SCREW ACE CAN 4.0 40M (Screw) ×4 IMPLANT
SCREW LOCK CORT STAR 3.5X10 (Screw) ×2 IMPLANT
SCREW LOCK CORT STAR 3.5X12 (Screw) ×2 IMPLANT
SCREW LOCK CORT STAR 3.5X14 (Screw) ×4 IMPLANT
SCREW NON LOCKING LP 3.5 14MM (Screw) ×8 IMPLANT
SPLINT FIBERGLASS 4X30 (CAST SUPPLIES) ×2 IMPLANT
SUT ETHILON 4 0 PS 2 18 (SUTURE) ×6 IMPLANT
SUT VIC AB 2-0 CT1 27 (SUTURE) ×6
SUT VIC AB 2-0 CT1 TAPERPNT 27 (SUTURE) ×1 IMPLANT
SUT VIC AB 2-0 CT2 27 (SUTURE) ×2 IMPLANT
SYR CONTROL 10ML LL (SYRINGE) ×3 IMPLANT
TOWEL OR 17X26 10 PK STRL BLUE (TOWEL DISPOSABLE) ×3 IMPLANT

## 2019-09-07 NOTE — Progress Notes (Signed)
Assisted Dr. Turk with right, ultrasound guided, popliteal/saphenous block. Side rails up, monitors on throughout procedure. See vital signs in flow sheet. Tolerated Procedure well. 

## 2019-09-07 NOTE — Transfer of Care (Signed)
Immediate Anesthesia Transfer of Care Note  Patient: Alyssa Armstrong  Procedure(s) Performed: OPEN REDUCTION INTERNAL FIXATION (ORIF), RIGHT MALLEOLAR ANKLE FRACTURE (Bilateral Ankle)  Patient Location: PACU  Anesthesia Type:General  Level of Consciousness: drowsy, patient cooperative and responds to stimulation  Airway & Oxygen Therapy: Patient Spontanous Breathing and Patient connected to face mask oxygen  Post-op Assessment: Report given to RN and Post -op Vital signs reviewed and stable  Post vital signs: Reviewed and stable  Last Vitals:  Vitals Value Taken Time  BP 150/70 09/07/19 1649  Temp    Pulse 108 09/07/19 1650  Resp 23 09/07/19 1650  SpO2 100 % 09/07/19 1650  Vitals shown include unvalidated device data.  Last Pain:  Vitals:   09/07/19 1411  TempSrc:   PainSc: 0-No pain      Patients Stated Pain Goal: 4 (94/99/71 8209)  Complications: No complications documented.

## 2019-09-07 NOTE — Interval H&P Note (Signed)
History and Physical Interval Note:  09/07/2019 1:44 PM  Alyssa Armstrong  has presented today for surgery, with the diagnosis of broken ankle.  The various methods of treatment have been discussed with the patient and family. After consideration of risks, benefits and other options for treatment, the patient has consented to  Procedure(s): OPEN REDUCTION INTERNAL FIXATION (ORIF) ANKLE FRACTURE, RIGHT BIMALLEOLAR ANKLE FRACTURE (Bilateral) as a surgical intervention.  The patient's history has been reviewed, patient examined, no change in status, stable for surgery.  I have reviewed the patient's chart and labs.  Questions were answered to the patient's satisfaction.     Alyssa Armstrong

## 2019-09-07 NOTE — Op Note (Signed)
Preop diagnosis: Right closed trimalleolar ankle fracture, displaced.  Postop diagnosis: Same  Procedure: Open reduction internal fixation right trimalleolar ankle fracture with fixation of all 3 components.  Surgeon: Rodell Perna, MD  Assistant: Benjiman Core, PA-C medically necessary and present for the entire procedure  Anesthesia is preoperative popliteal block plus general anesthesia  Tourniquet time: 300 x 1 hour.  Implants: Biomet composite lateral fibular plate with locking nonlocking screws.  2 cannulated 4 -0 medial malleolar lag screws.  Posterior malleolus leg from anterior posterior 40 mm screw.  Procedure: After induction of popliteal block general anesthesia proximal thigh tourniquet standard prepping and draping standard sheets and drapes were used.  Skin marker was used sterile gloves covered the toes timeout procedure was completed Ancef prophylactically.  Medial incision was made.  Fracture was distracted hematoma washed out there is nothing blocking reduction medially.  Lateral incision was made.  Fibula was comminuted with segmental pieces of the fibula that broke and flipped and medial portion of the fibula that had the syndesmosis ligament attached it with flipped 180 degrees and hanging laterally.  Fracture was distracted piece of bone were cleaned out and piece by piece the fracture was reduced.  Plate was applied held with vertebrae she clamp and proximal screws were filled with locking nonlocking screws.  Distally fragment was taken out to length and nonlocking screw was used in the distal fragment.  A second nonlocking screw was used angling the tip of the plate to catch the very tip for the best fixation.  Combination of the locking screws.  Using the remaining fragments the fibula is plan by x-ray good reduction of the mortise.  Attention was turned medially after all 8 screws were filled and 2 pins were drilled and self-tapping 4 oh malleolar screws were placed.  Finally  small incision was made anteriorly blunt dissection down to the cortex to protect soft tissue neurovascular bundle drilling under C-arm visualization and lagging the posterior cortex a few millimeters above the joint with anatomic reduction of the posterior malleolar fragment.  Irrigation saline solution tourniquet deflation an hour standard layered closure medial lateral incisions with 2-0 Vicryl.  Skin staple closure.  Short leg splint well-padded was applied and patient was transferred to the recovery room.

## 2019-09-07 NOTE — Anesthesia Preprocedure Evaluation (Signed)
Anesthesia Evaluation  Patient identified by MRN, date of birth, ID band Patient awake    Reviewed: Allergy & Precautions, NPO status , Patient's Chart, lab work & pertinent test results  History of Anesthesia Complications Negative for: history of anesthetic complications  Airway Mallampati: II  TM Distance: >3 FB Neck ROM: Full    Dental  (+) Teeth Intact, Dental Advisory Given   Pulmonary neg pulmonary ROS,    Pulmonary exam normal breath sounds clear to auscultation       Cardiovascular hypertension, Pt. on medications (-) angina(-) Past MI Normal cardiovascular exam Rhythm:Regular Rate:Normal     Neuro/Psych PSYCHIATRIC DISORDERS Depression negative neurological ROS     GI/Hepatic Neg liver ROS, GERD  Medicated and Controlled,  Endo/Other  diabetes, Type 2Obesity   Renal/GU      Musculoskeletal negative musculoskeletal ROS (+)   Abdominal   Peds  Hematology negative hematology ROS (+)   Anesthesia Other Findings Day of surgery medications reviewed with the patient.  Reproductive/Obstetrics                             Anesthesia Physical Anesthesia Plan  ASA: II  Anesthesia Plan: General   Post-op Pain Management:  Regional for Post-op pain   Induction: Intravenous  PONV Risk Score and Plan: 3 and Midazolam, Dexamethasone and Ondansetron  Airway Management Planned: LMA  Additional Equipment:   Intra-op Plan:   Post-operative Plan: Extubation in OR  Informed Consent: I have reviewed the patients History and Physical, chart, labs and discussed the procedure including the risks, benefits and alternatives for the proposed anesthesia with the patient or authorized representative who has indicated his/her understanding and acceptance.     Dental advisory given  Plan Discussed with: CRNA  Anesthesia Plan Comments:         Anesthesia Quick Evaluation

## 2019-09-07 NOTE — Progress Notes (Signed)
Office Visit Note/admission history and physical   Patient: Alyssa Armstrong           Date of Birth: 1950-05-26           MRN: [id number] Visit Date: 09/07/2019              Requested by: Bernerd Limbo, MD [place],  [place] [id number] PCP: Bernerd Limbo, MD   Assessment & Plan: Visit Diagnoses:  1. Closed trimalleolar fracture of right ankle, initial encounter     Plan: Procedure for operative fixation trimalleolar ankle fracture discussed.  Plan for cannulated screws medially she will need a plate laterally with comminuted fibular fracture may require fixation of the posterior malleolar fragment lagging from front to back which we discussed in detail.  Questions were elicited and answered she will stay in the hospital overnight and may need a wheelchair when discharge and certainly will need a walker.  She will be nonweightbearing postop.  Questions were elicited and answered she and her boyfriend had additional questions which were answered.  She understands and requests we proceed.  Follow-Up Instructions: No follow-ups on file.   Orders:  No orders of the defined types were placed in this encounter.  No orders of the defined types were placed in this encounter.     Procedures: No procedures performed   Clinical Data: No additional findings.   Subjective: Chief Complaint  Patient presents with  . Right Ankle - Fracture    DOI 09/06/2019    HPI 69 year old female states her sister fell on her leg suffering a fracture dislocation right ankle on 09/06/2019.  She had partial reduction emergency room with splinting and is here today with her boyfriend.  She has had problems with dyslipidemia, hypertension and also pes planus.  She has crutches but has some problems using them.  She had pain medication oxycodone from emergency room and states is not really working 1 p.o. every 6 hours.  She has had some problems with hyperlipidemia depression  on Wellbutrin also intermittent constipation problems for which she uses MiraLAX.  Patient  has been on Effexor.  Review of Systems all other systems are negative as pertains HPI.   Objective: Vital Signs: BP (!) 146/75   Pulse 76   Ht 5\' 4"  (1.626 m)   Wt 194 lb (88 kg)   BMI 33.30 kg/m   Physical Exam Constitutional:      Appearance: She is well-developed.  HENT:     Head: Normocephalic.     Right Ear: External ear normal.     Left Ear: External ear normal.  Eyes:     Pupils: Pupils are equal, round, and reactive to light.  Neck:     Thyroid: No thyromegaly.     Trachea: No tracheal deviation.  Cardiovascular:     Rate and Rhythm: Normal rate.  Pulmonary:     Effort: Pulmonary effort is normal.  Abdominal:     Palpations: Abdomen is soft.  Skin:    General: Skin is warm and dry.  Neurological:     Mental Status: She is alert and oriented to person, place, and time.  Psychiatric:        Behavior: Behavior normal.     Ortho Exam well-padded short leg splint right ankle.  Sensation to the tip of the toes is intact. Specialty Comments:  No specialty comments available.  Imaging: DG Tibia/Fibula Right  Result Date: 09/06/2019 CLINICAL DATA:  Fall with  obvious deformity EXAM: RIGHT TIBIA AND FIBULA - 2 VIEW COMPARISON:  None. FINDINGS: Moderate plantar calcaneal spur. No acute displaced fracture involving the proximal to mid tibia or fibula. Acute comminuted distal fibular fracture with moderate lateral angulation and close to 1 shaft diameter lateral displacement of the talar dome with respect to the distal tibia. Acute comminuted medial malleolar fracture. IMPRESSION: Acute comminuted distal fibular and medial malleolar fractures with lateral displacement and angulation of the talar dome with respect to the distal tibia. Electronically Signed   By: Donavan Foil M.D.   On: 09/06/2019 18:21   DG Ankle 2 Views Right  Result Date: 09/06/2019 CLINICAL DATA:  Fall with  deformity EXAM: RIGHT ANKLE - 2 VIEW COMPARISON:  None. FINDINGS: Acute comminuted medial malleolar fracture. Acute comminuted distal fibular fracture with greater than 1 shaft diameter lateral displacement of distal fibular fracture fragment. Moderate lateral angulation of distal fibular fracture fragment as well. There is lateral dislocation of the talar dome by about 1 shaft diameter with respect to the distal tibia. There is widening of the anterior ankle joint with mild posterior positioning of the talar dome with respect to the distal tibia. Limited assessment for posterior malleolar fracture due to osseous overlap. IMPRESSION: Acute displaced and angulated distal fibular fracture with additional medial malleolar fracture. There is lateral and posterior displacement of the talar dome with respect to the distal tibia. Electronically Signed   By: Donavan Foil M.D.   On: 09/06/2019 18:26   DG Ankle Right Port  Result Date: 09/06/2019 CLINICAL DATA:  Bimalleolar fracture dislocation status post reduction EXAM: PORTABLE RIGHT ANKLE - 2 VIEW COMPARISON:  09/06/2019 at 6:01 p.m. FINDINGS: Frontal and lateral views of the right ankle are obtained. There is been reduction of the tibiotalar dislocation, with persistent 1.4 cm lateral translation of the talus relative to the tibial plafond. Comminuted oblique distal fibular diaphyseal fracture demonstrates improved alignment, with 0.7 cm of residual lateral translation of the distal fracture fragment. Slight reduction of the medial malleolar fracture with significant residual lateral displacement. There is diffuse soft tissue swelling. Overlying cast material obscures underlying bony detail. IMPRESSION: 1. Reduction of the tibiotalar dislocation seen previously, with residual lateral translation of the talus relative to the tibial plafond as above. 2. Slight reduction in the medial and lateral malleolar fractures as above, with significant residual lateral  displacement. Electronically Signed   By: Randa Ngo M.D.   On: 09/06/2019 21:26     PMFS History: Patient Active Problem List   Diagnosis Date Noted  . Trimalleolar fracture of ankle, closed 09/07/2019  . HTN (hypertension) 09/07/2012  . Dyslipidemia 09/07/2012  . PES PLANUS 02/28/2008   Past Medical History:  Diagnosis Date  . Bronchitis   . Depression 17YRS AGO  . Diabetes mellitus without complication ([place])   . GERD (gastroesophageal reflux disease)   . Hypertension     Family History  Problem Relation Age of Onset  . Diabetes Mother   . Heart disease Mother   . Diabetes Father   . Cancer Father        LUNG  . Hyperlipidemia Brother     Past Surgical History:  Procedure Laterality Date  . ABDOMINAL HYSTERECTOMY     PARTIAL  . BUNIONECTOMY  2009  . ROTATOR CUFF REPAIR  2008  . rotator cuff suregery     Social History   Occupational History  . Not on file  Tobacco Use  . Smoking status: Never Smoker  .  Smokeless tobacco: Never Used  Substance and Sexual Activity  . Alcohol use: Yes  . Drug use: No  . Sexual activity: Yes

## 2019-09-07 NOTE — Anesthesia Procedure Notes (Signed)
Anesthesia Regional Block: Popliteal block   Pre-Anesthetic Checklist: ,, timeout performed, Correct Patient, Correct Site, Correct Laterality, Correct Procedure, Correct Position, site marked, Risks and benefits discussed,  Surgical consent,  Pre-op evaluation,  At surgeon's request and post-op pain management  Laterality: Right  Prep: chloraprep       Needles:  Injection technique: Single-shot  Needle Type: Echogenic Needle     Needle Length: 9cm  Needle Gauge: 21     Additional Needles:   Procedures:,,,, ultrasound used (permanent image in chart),,,,  Narrative:  Start time: 09/07/2019 1:37 PM End time: 09/07/2019 1:47 PM Injection made incrementally with aspirations every 5 mL.  Performed by: Personally  Anesthesiologist: Catalina Gravel, MD  Additional Notes: No pain on injection. No increased resistance to injection. Injection made in 5cc increments.  Good needle visualization.  Patient tolerated procedure well.

## 2019-09-07 NOTE — Anesthesia Postprocedure Evaluation (Signed)
Anesthesia Post Note  Patient: Alyssa Armstrong  Procedure(s) Performed: OPEN REDUCTION INTERNAL FIXATION (ORIF), RIGHT MALLEOLAR ANKLE FRACTURE (Bilateral Ankle)     Patient location during evaluation: PACU Anesthesia Type: General Level of consciousness: awake and alert Pain management: pain level controlled Vital Signs Assessment: post-procedure vital signs reviewed and stable Respiratory status: spontaneous breathing, nonlabored ventilation and respiratory function stable Cardiovascular status: blood pressure returned to baseline and stable Postop Assessment: no apparent nausea or vomiting Anesthetic complications: no   No complications documented.  Last Vitals:  Vitals:   09/07/19 1715 09/07/19 1730  BP: 131/68 (!) 146/72  Pulse: 92 91  Resp: 19 16  Temp:    SpO2: 100% 100%    Last Pain:  Vitals:   09/07/19 1715  TempSrc:   PainSc: 0-No pain                 Lameisha Schuenemann A.

## 2019-09-07 NOTE — Anesthesia Procedure Notes (Signed)
Anesthesia Regional Block: Adductor canal block   Pre-Anesthetic Checklist: ,, timeout performed, Correct Patient, Correct Site, Correct Laterality, Correct Procedure, Correct Position, site marked, Risks and benefits discussed,  Surgical consent,  Pre-op evaluation,  At surgeon's request and post-op pain management  Laterality: Right  Prep: chloraprep       Needles:  Injection technique: Single-shot  Needle Type: Echogenic Needle     Needle Length: 9cm  Needle Gauge: 21     Additional Needles:   Procedures:,,,, ultrasound used (permanent image in chart),,,,  Narrative:  Start time: 09/07/2019 1:47 PM End time: 09/07/2019 1:52 PM Injection made incrementally with aspirations every 5 mL.  Performed by: Personally  Anesthesiologist: Catalina Gravel, MD  Additional Notes: No pain on injection. No increased resistance to injection. Injection made in 5cc increments.  Good needle visualization.  Patient tolerated procedure well.

## 2019-09-07 NOTE — Plan of Care (Signed)
  Problem: Activity: Goal: Risk for activity intolerance will decrease Outcome: Progressing   Problem: Nutrition: Goal: Adequate nutrition will be maintained Outcome: Progressing   

## 2019-09-07 NOTE — ED Provider Notes (Signed)
Physical Exam  BP 121/85   Pulse 66   Temp 98.1 F (36.7 C) (Oral)   Resp 12   Ht 5\' 4"  (1.626 m)   Wt 88 kg   SpO2 100%   BMI 33.30 kg/m   Physical Exam  ED Course/Procedures   Clinical Course as of Sep 07 23  Tue Sep 06, 2019  6767 FINDINGS: Acute comminuted medial malleolar fracture. Acute comminuted distal fibular fracture with greater than 1 shaft diameter lateral displacement of distal fibular fracture fragment. Moderate lateral angulation of distal fibular fracture fragment as well. There is lateral dislocation of the talar dome by about 1 shaft diameter with respect to the distal tibia. There is widening of the anterior ankle joint with mild posterior positioning of the talar dome with respect to the distal tibia. Limited assessment for posterior malleolar fracture due to osseous overlap.  IMPRESSION: Acute displaced and angulated distal fibular fracture with additional medial malleolar fracture. There is lateral and posterior displacement of the talar dome with respect to the distal tibia.  DG Ankle 2 Views Right [CG]  2025 Significant delay in conscious sedation/reduction. Unfortunately another patient in ER coded several times and only one RT available    [CG]    Clinical Course User Index [CG] Kinnie Feil, PA-C    .Sedation  Date/Time: 09/07/2019 12:25 AM Performed by: Davonna Belling, MD Authorized by: Davonna Belling, MD   Consent:    Consent obtained:  Verbal   Consent given by:  Patient   Risks discussed:  Allergic reaction, dysrhythmia, inadequate sedation, nausea, vomiting, respiratory compromise necessitating ventilatory assistance and intubation and prolonged hypoxia resulting in organ damage   Alternatives discussed:  Analgesia without sedation Universal protocol:    Immediately prior to procedure a time out was called: yes     Patient identity confirmation method:  Arm band and verbally with patient Indications:    Procedure  performed:  Fracture reduction   Procedure necessitating sedation performed by:  Physician performing sedation Pre-sedation assessment:    Time since last food or drink:  4   ASA classification: class 2 - patient with mild systemic disease     Neck mobility: normal     Mallampati score:  III - soft palate, base of uvula visible   Pre-sedation assessments completed and reviewed: airway patency, cardiovascular function, hydration status, mental status, nausea/vomiting, pain level, respiratory function and temperature   Immediate pre-procedure details:    Reassessment: Patient reassessed immediately prior to procedure     Reviewed: vital signs     Verified: bag valve mask available, emergency equipment available, intubation equipment available, IV patency confirmed and oxygen available   Procedure details (see MAR for exact dosages):    Preoxygenation:  Nasal cannula   Sedation:  Propofol   Intended level of sedation: deep   Analgesia:  Fentanyl   Intra-procedure monitoring:  Blood pressure monitoring, cardiac monitor, continuous pulse oximetry, continuous capnometry, frequent LOC assessments and frequent vital sign checks   Intra-procedure events: none     Total Provider sedation time (minutes):  10 Post-procedure details:    Post-sedation assessment completed:  09/06/2019 9:10 PM   Attendance: Constant attendance by certified staff until patient recovered     Recovery: Patient returned to pre-procedure baseline     Post-sedation assessments completed and reviewed: airway patency, cardiovascular function, hydration status, mental status, nausea/vomiting, pain level, respiratory function and temperature     Patient is stable for discharge or admission: yes  Patient tolerance:  Tolerated well, no immediate complications    MDM        Davonna Belling, MD 09/07/19 [phone]

## 2019-09-07 NOTE — H&P (Signed)
Office Visit Note/admission history and physical              Patient: Alyssa Armstrong                                  Date of Birth: 11-Oct-1950                                                    MRN: [id number] Visit Date: 09/07/2019                                                                     Requested by: Bernerd Limbo, MD [facility] [address] [place],  [place] [id number] PCP: Bernerd Limbo, MD   Assessment & Plan: Visit Diagnoses:  1. Closed trimalleolar fracture of right ankle, initial encounter     Plan: Procedure for operative fixation trimalleolar ankle fracture discussed.  Plan for cannulated screws medially she will need a plate laterally with comminuted fibular fracture may require fixation of the posterior malleolar fragment lagging from front to back which we discussed in detail.  Questions were elicited and answered she will stay in the hospital overnight and may need a wheelchair when discharge and certainly will need a walker.  She will be nonweightbearing postop.  Questions were elicited and answered she and her boyfriend had additional questions which were answered.  She understands and requests we proceed.  Follow-Up Instructions: No follow-ups on file.   Orders:  No orders of the defined types were placed in this encounter.  No orders of the defined types were placed in this encounter.     Procedures: No procedures performed   Clinical Data: No additional findings.   Subjective:     Chief Complaint  Patient presents with  . Right Ankle - Fracture    DOI 09/06/2019    HPI 69 year old female states her sister fell on her leg suffering a fracture dislocation right ankle on 09/06/2019.  She had partial reduction emergency room with splinting and is here today with her boyfriend.  She has had problems with dyslipidemia, hypertension and also pes planus.  She has crutches but has some problems using them.  She had pain  medication oxycodone from emergency room and states is not really working 1 p.o. every 6 hours.  She has had some problems with hyperlipidemia depression on Wellbutrin also intermittent constipation problems for which she uses MiraLAX.  Patient  has been on Effexor.  Review of Systems all other systems are negative as pertains HPI.   Objective: Vital Signs: BP (!) 146/75   Pulse 76   Ht 5\' 4"  (1.626 m)   Wt 194 lb (88 kg)   BMI 33.30 kg/m   Physical Exam Constitutional:      Appearance: She is well-developed.  HENT:     Head: Normocephalic.     Right Ear: External ear normal.     Left Ear: External ear normal.  Eyes:     Pupils: Pupils are equal, round, and reactive to light.  Neck:     Thyroid: No thyromegaly.     Trachea: No tracheal deviation.  Cardiovascular:     Rate and Rhythm: Normal rate.  Pulmonary:     Effort: Pulmonary effort is normal.  Abdominal:     Palpations: Abdomen is soft.  Skin:    General: Skin is warm and dry.  Neurological:     Mental Status: She is alert and oriented to person, place, and time.  Psychiatric:        Behavior: Behavior normal.     Ortho Exam well-padded short leg splint right ankle.  Sensation to the tip of the toes is intact. Specialty Comments:  No specialty comments available.  Imaging: DG Tibia/Fibula Right  Result Date: 09/06/2019 CLINICAL DATA:  Fall with obvious deformity EXAM: RIGHT TIBIA AND FIBULA - 2 VIEW COMPARISON:  None. FINDINGS: Moderate plantar calcaneal spur. No acute displaced fracture involving the proximal to mid tibia or fibula. Acute comminuted distal fibular fracture with moderate lateral angulation and close to 1 shaft diameter lateral displacement of the talar dome with respect to the distal tibia. Acute comminuted medial malleolar fracture. IMPRESSION: Acute comminuted distal fibular and medial malleolar fractures with lateral displacement and angulation of the talar dome with respect to the  distal tibia. Electronically Signed   By: Donavan Foil M.D.   On: 09/06/2019 18:21   DG Ankle 2 Views Right  Result Date: 09/06/2019 CLINICAL DATA:  Fall with deformity EXAM: RIGHT ANKLE - 2 VIEW COMPARISON:  None. FINDINGS: Acute comminuted medial malleolar fracture. Acute comminuted distal fibular fracture with greater than 1 shaft diameter lateral displacement of distal fibular fracture fragment. Moderate lateral angulation of distal fibular fracture fragment as well. There is lateral dislocation of the talar dome by about 1 shaft diameter with respect to the distal tibia. There is widening of the anterior ankle joint with mild posterior positioning of the talar dome with respect to the distal tibia. Limited assessment for posterior malleolar fracture due to osseous overlap. IMPRESSION: Acute displaced and angulated distal fibular fracture with additional medial malleolar fracture. There is lateral and posterior displacement of the talar dome with respect to the distal tibia. Electronically Signed   By: Donavan Foil M.D.   On: 09/06/2019 18:26   DG Ankle Right Port  Result Date: 09/06/2019 CLINICAL DATA:  Bimalleolar fracture dislocation status post reduction EXAM: PORTABLE RIGHT ANKLE - 2 VIEW COMPARISON:  09/06/2019 at 6:01 p.m. FINDINGS: Frontal and lateral views of the right ankle are obtained. There is been reduction of the tibiotalar dislocation, with persistent 1.4 cm lateral translation of the talus relative to the tibial plafond. Comminuted oblique distal fibular diaphyseal fracture demonstrates improved alignment, with 0.7 cm of residual lateral translation of the distal fracture fragment. Slight reduction of the medial malleolar fracture with significant residual lateral displacement. There is diffuse soft tissue swelling. Overlying cast material obscures underlying bony detail. IMPRESSION: 1. Reduction of the tibiotalar dislocation seen previously, with residual lateral translation of  the talus relative to the tibial plafond as above. 2. Slight reduction in the medial and lateral malleolar fractures as above, with significant residual lateral displacement. Electronically Signed   By: Randa Ngo M.D.   On: 09/06/2019 21:26     PMFS History:     Patient Active Problem List   Diagnosis Date Noted  . Trimalleolar fracture of ankle, closed 09/07/2019  . HTN (hypertension) 09/07/2012  . Dyslipidemia 09/07/2012  . PES PLANUS 02/28/2008  Past Medical History:  Diagnosis Date  . Bronchitis   . Depression 60YRS AGO  . Diabetes mellitus without complication ([place])   . GERD (gastroesophageal reflux disease)   . Hypertension          Family History  Problem Relation Age of Onset  . Diabetes Mother   . Heart disease Mother   . Diabetes Father   . Cancer Father        LUNG  . Hyperlipidemia Brother          Past Surgical History:  Procedure Laterality Date  . ABDOMINAL HYSTERECTOMY     PARTIAL  . BUNIONECTOMY  2009  . ROTATOR CUFF REPAIR  2008  . rotator cuff suregery     Social History       Occupational History  . Not on file  Tobacco Use  . Smoking status: Never Smoker  . Smokeless tobacco: Never Used  Substance and Sexual Activity  . Alcohol use: Yes  . Drug use: No  . Sexual activity: Yes              Electronically signed by Marybelle Killings, MD at 09/07/2019 12:08 PM

## 2019-09-08 ENCOUNTER — Other Ambulatory Visit: Payer: Self-pay

## 2019-09-08 LAB — CBC
HCT: 35.1 % — ABNORMAL LOW (ref 36.0–46.0)
Hemoglobin: 11.4 g/dL — ABNORMAL LOW (ref 12.0–15.0)
MCH: 25.4 pg — ABNORMAL LOW (ref 26.0–34.0)
MCHC: 32.5 g/dL (ref 30.0–36.0)
MCV: 78.3 fL — ABNORMAL LOW (ref 80.0–100.0)
Platelets: 226 10*3/uL (ref 150–400)
RBC: 4.48 MIL/uL (ref 3.87–5.11)
RDW: 15.9 % — ABNORMAL HIGH (ref 11.5–15.5)
WBC: 10.1 10*3/uL (ref 4.0–10.5)
nRBC: 0 % (ref 0.0–0.2)

## 2019-09-08 LAB — BASIC METABOLIC PANEL
Anion gap: 7 (ref 5–15)
BUN: 15 mg/dL (ref 8–23)
CO2: 26 mmol/L (ref 22–32)
Calcium: 8.7 mg/dL — ABNORMAL LOW (ref 8.9–10.3)
Chloride: 105 mmol/L (ref 98–111)
Creatinine, Ser: 0.81 mg/dL (ref 0.44–1.00)
GFR calc Af Amer: >60 mL/min (ref >60)
GFR calc non Af Amer: >60 mL/min (ref >60)
Glucose, Bld: 160 mg/dL — ABNORMAL HIGH (ref 70–99)
Potassium: 3.9 mmol/L (ref 3.5–5.1)
Sodium: 138 mmol/L (ref 135–145)

## 2019-09-08 MED ORDER — ASPIRIN 325 MG PO TABS: 325.0000 mg | ORAL_TABLET | Freq: Every day | ORAL | 0 refills | Status: DC

## 2019-09-08 MED ORDER — OXYCODONE-ACETAMINOPHEN 5-325 MG PO TABS: 1.0000 | ORAL_TABLET | Freq: Four times a day (QID) | ORAL | 0 refills | Status: DC | PRN

## 2019-09-08 NOTE — Evaluation (Signed)
Occupational Therapy Evaluation Patient Details Name: Alyssa Armstrong MRN: [id number] DOB: [birth year] Today's Date: 09/08/2019    History of Present Illness The patient is a 69 year old woman s/p ORIF right ankle fracture.   Clinical Impression   On evaluation the patient presents s/p ORIF of right ankle and NWB status. Patient demonstrates normal ROM and strength of upper extremities and demonstrates ability to perform functional mobility with RW after instruction in order to return home at discharge. Patient requires min assist for lower body ADLs and set up assistance at this time. Patient reports having 24/7 assistance at home. Patient and daughter provided with instruction and education in regards to DME for home use including BSC, shower chair and use of wheelchair for community distances. Patient and family verbalize understanding. No further OT needs at this time.   Follow Up Recommendations  No OT follow up    Equipment Recommendations  3 in 1 bedside commode    Recommendations for Other Services       Precautions / Restrictions Precautions Precautions: Fall Restrictions Weight Bearing Restrictions: Yes RLE Weight Bearing: Non weight bearing      Mobility Bed Mobility Overal bed mobility: Modified Independent             General bed mobility comments: Increased time. Uses upper extremities to assist with RLE  Transfers Overall transfer level: Needs assistance Equipment used: Rolling walker (2 wheeled) Transfers: Sit to/from [facility] Sit to Stand: Min guard Stand pivot transfers: Min guard       General transfer comment: verbal cues for technique    Balance                                           ADL either performed or assessed with clinical judgement   ADL Overall ADL's : Needs assistance/impaired Eating/Feeding: Independent   Grooming: Independent   Upper Body Bathing: Set up   Lower Body Bathing:  Set up;Sitting/lateral leans   Upper Body Dressing : Set up   Lower Body Dressing: Minimal assistance;Sit to/from stand   Toilet Transfer: Min guard;Ambulation;Grab bars;Regular Museum/gallery exhibitions officer and Hygiene: Modified independent       Functional mobility during ADLs: Min guard;Rolling walker       Vision   Vision Assessment?: No apparent visual deficits     Perception     Praxis      Pertinent Vitals/Pain Pain Assessment: No/denies pain (block still in effect)     Hand Dominance Right   Extremity/Trunk Assessment Upper Extremity Assessment Upper Extremity Assessment: Overall WFL for tasks assessed   Lower Extremity Assessment Lower Extremity Assessment: Defer to PT evaluation   Cervical / Trunk Assessment Cervical / Trunk Assessment: Normal   Communication Communication Communication: No difficulties   Cognition Arousal/Alertness: Awake/alert Behavior During Therapy: WFL for tasks assessed/performed Overall Cognitive Status: Within Functional Limits for tasks assessed                                     General Comments       Exercises     Shoulder Instructions      Home Living Family/patient expects to be discharged to:: Private residence Living Arrangements: Spouse/significant other;Children Available Help at Discharge: Family;Available 24 hours/day Type of Home: House Home Access:  Stairs to enter [employer] of Steps: 2 Entrance Stairs-Rails: Right Home Layout: One level     Bathroom Shower/Tub: Occupational psychologist: Standard Bathroom Accessibility: Yes How Accessible: Accessible via walker            Prior Functioning/Environment Level of Independence: Independent                 OT Problem List:        OT Treatment/Interventions:      OT Goals(Current goals can be found in the care plan section) Acute Rehab OT Goals OT Goal Formulation: All assessment and  education complete, DC therapy  OT Frequency:     Barriers to D/C:            Co-evaluation PT/OT/SLP Co-Evaluation/Treatment: Yes Reason for Co-Treatment: For patient/therapist safety PT goals addressed during session: Mobility/safety with mobility OT goals addressed during session: ADL's and self-care      AM-PAC OT "6 Clicks" Daily Activity     Outcome Measure Help from another person eating meals?: None Help from another person taking care of personal grooming?: None Help from another person toileting, which includes using toliet, bedpan, or urinal?: A Little Help from another person bathing (including washing, rinsing, drying)?: A Little Help from another person to put on and taking off regular upper body clothing?: A Little Help from another person to put on and taking off regular lower body clothing?: A Little 6 Click Score: 20   End of Session Equipment Utilized During Treatment: Gait belt;Rolling walker Nurse Communication:  (okay to see)  Activity Tolerance: Patient tolerated treatment well Patient left: in chair;with call bell/phone within reach;with chair alarm set  OT Visit Diagnosis: Other abnormalities of gait and mobility (R26.89)                Time: 3419-3790 OT Time Calculation (min): 31 min Charges:  OT General Charges $OT Visit: 1 Visit OT Evaluation $OT Eval Low Complexity: 1 Low  Tommey Barret, OTR/L [place]  Office [phone] Pager: [place] 09/08/2019, 12:20 PM

## 2019-09-08 NOTE — Progress Notes (Signed)
Physical Therapy Treatment Patient Details Name: Alyssa Armstrong MRN: [id number] DOB: 15-Oct-1950 Today's Date: 09/08/2019    History of Present Illness Patient is 69 y.o. female with PMH significant for HTN, DM, GERD, depression. Patient admitted to [facility] on 09/06/19 after a fall at home. Patient was outside with her dogs and trying to separate them.  Her sister unfortunately fell down to the ground but landed on her ankle and lower leg. X-ray revealed trimalleolar fracture and pt now s/p ORIF Rt trimalleolar ankle fracture with fixation of all 3 components on 09/07/19.    PT Comments    Patient making good progress with acute PT and seen for additional session to advance gait/stair training. Pt able to ambulate ~40' with RW and maintain NWB on Rt LE, pt fatigued and required seated rest break. Educated pt/family on [place] of wheelchair for longer distances to reduce fall risk. Pt complete stair training with one rail and crutch to ascend/descend with step to pattern. Pt's daughter present and provided guarding/assist. Educated pt/daughter on safety with stairs and need for 2 person assist. Pt instructed to wiggle toes on Rt LE and keep elevated to help with circulation and reduce swelling. Patient will require RW, BSC, and WC to return home safely with assist from family. Acute PT will follow during stay to progress independence.   Follow Up Recommendations  Follow surgeon's recommendation for DC plan and follow-up therapies;Supervision for mobility/OOB     Equipment Recommendations  Rolling walker with 5" wheels;3in1 (PT);Wheelchair (measurements PT)    Recommendations for Other Services       Precautions / Restrictions Precautions Precautions: Fall Restrictions Weight Bearing Restrictions: Yes RLE Weight Bearing: Non weight bearing    Mobility  Bed Mobility Overal bed mobility: Modified Independent             General bed mobility comments: pt OOB in  recliner  Transfers Overall transfer level: Needs assistance Equipment used: Rolling walker (2 wheeled) Transfers: Sit to/from [facility] Sit to Stand: Min guard Stand pivot transfers: Min guard       General transfer comment: VC's for technique with RW, pt able to perform power up with min guard to steady. Pt maintained NWB on Rt LE.   Ambulation/Gait Ambulation/Gait assistance: Min guard;Min assist Gait Distance (Feet): 40 Feet Assistive device: Rolling walker (2 wheeled) Gait Pattern/deviations: Step-to pattern ("hop to pattern" Rt LE NWB) Gait velocity: decr   General Gait Details: pt with good carryover to remain NWB on Rt LE. pt taking rest breaks during gait secondary to UE fatigue and Rt LE fatigue. pt reported slight lightheadedness, VSS (BP 140/62 mmHg and HR 78 bpm)   Stairs Stairs: Yes Stairs assistance: Min assist;+2 safety/equipment Stair Management: One rail Right;Step to pattern;Forwards;With crutches Number of Stairs: 2 General stair comments: VC's for safe sequencing of crutch with stair mobility. Pt maintained NWB throughout on Rt LE. pt using Rt UE on rail to ascend and crutch to support on Lt and hop Lt LE up one step at a time. Pt's daughter providing guarding and assist along with PT. Pt able to use Crutch in Rt UE and rail on Lt to descend with hop to pattern on Lt LE. Pt/daughter educated on safe guarding/assist from 2 persons to enter home. Educted to use WC to get from car to front steps to limit fatigue prior to ascending.   Wheelchair Mobility    Modified Rankin (Stroke Patients Only)       Balance Overall balance  assessment: Needs assistance Sitting-balance support: Feet supported Sitting balance-Leahy Scale: Good     Standing balance support: Bilateral upper extremity supported;During functional activity Standing balance-Leahy Scale: Fair Standing balance comment: able to maintain static balance on Lt LE. requires UE  support for gait.               Cognition Arousal/Alertness: Awake/alert Behavior During Therapy: WFL for tasks assessed/performed Overall Cognitive Status: Within Functional Limits for tasks assessed             Exercises Other Exercises Other Exercises: educated to wiggle toes on Rt LE to help with circulation.    General Comments        Pertinent Vitals/Pain Pain Assessment: No/denies pain    Home Living Family/patient expects to be discharged to:: Private residence Living Arrangements: Spouse/significant other;Children Available Help at Discharge: Family;Available 24 hours/day Type of Home: House Home Access: Stairs to enter Entrance Stairs-Rails: Right Home Layout: One level Home Equipment: None      Prior Function Level of Independence: Independent          PT Goals (current goals can now be found in the care plan section) Acute Rehab PT Goals Patient Stated Goal: get home today PT Goal Formulation: With patient Time For Goal Achievement: 09/15/19 Potential to Achieve Goals: Good Progress towards PT goals: Progressing toward goals    Frequency    Min 5X/week      PT Plan Current plan remains appropriate    Co-evaluation PT/OT/SLP Co-Evaluation/Treatment: Yes Reason for Co-Treatment: For patient/therapist safety PT goals addressed during session: Mobility/safety with mobility;Balance;Proper use of DME OT goals addressed during session: ADL's and self-care      AM-PAC PT "6 Clicks" Mobility   Outcome Measure  Help needed turning from your back to your side while in a flat bed without using bedrails?: None Help needed moving from lying on your back to sitting on the side of a flat bed without using bedrails?: None Help needed moving to and from a bed to a chair (including a wheelchair)?: A Little Help needed standing up from a chair using your arms (e.g., wheelchair or bedside chair)?: A Little Help needed to walk in hospital room?: A  Little Help needed climbing 3-5 steps with a railing? : A Lot 6 Click Score: 19    End of Session Equipment Utilized During Treatment: Gait belt Activity Tolerance: Patient tolerated treatment well Patient left: in chair;with call bell/phone within reach;with chair alarm set;with family/visitor present Nurse Communication: Mobility status PT Visit Diagnosis: Muscle weakness (generalized) (M62.81);Difficulty in walking, not elsewhere classified (R26.2)     Time: 6010-9323 PT Time Calculation (min) (ACUTE ONLY): 23 min  Charges:  $Gait Training: 23-37 mins                     Verner Mould, DPT Acute Rehabilitation Services  Office [phone] Pager [phone]  09/08/2019 12:58 PM

## 2019-09-08 NOTE — TOC Transition Note (Signed)
Transition of Care [facility]) - CM/SW Discharge Note   Patient Details  Name: Sharonne Ricketts MRN: [id number] Date of Birth: September 06, 1950  [facility]) CM/SW Contact:  Lennart Pall, LCSW Phone Number: 09/08/2019, 2:49 PM   Clinical Narrative:   Met with pt to review d/c needs.  Pt agreeable with DME and HHPT follow up recs. Referrals made (see below info).  Ready for d/c this afternoon.  No further TOC needs.    Final next level of care: [place] Barriers to Discharge: Barriers Resolved   Patient Goals and CMS Choice Patient states their goals for this hospitalization and ongoing recovery are:: go home CMS Medicare.gov Compare Post Acute Care list provided to:: Patient Choice offered to / list presented to : Patient  Discharge Placement                       Discharge Plan and Services                DME Arranged: 3-N-1, Walker rolling, Wheelchair manual DME Agency: [employer] Date DME Agency Contacted: 09/08/19 Time DME Agency Contacted: [phone] Representative spoke with at DME Agency: [place]: PT [place] Date [place]: 09/08/19 Time [place]: 1448 Representative spoke with at [place]: [place] ([place]) Interventions     Readmission Risk Interventions No flowsheet data found.

## 2019-09-08 NOTE — Progress Notes (Signed)
Subjective: Doing well.  Pain controlled.  Progressing well with PT.     Objective: Vital signs in last 24 hours: Temp:  [98.6 F (37 C)-99.5 F (37.5 C)] 98.6 F (37 C) (07/22 0615) Pulse Rate:  [68-105] 68 (07/22 0615) Resp:  [11-28] 16 (07/22 0615) BP: (127-180)/(52-107) 127/61 (07/22 0615) SpO2:  [96 %-100 %] 100 % (07/22 0615) Weight:  [86.2 kg] 86.2 kg (07/21 1300)  Intake/Output from previous day: 07/21 0701 - 07/22 0700 In: 1368.3 [P.O.:120; I.V.:1198.3; IV Piggyback:50] Out: 2200 [Urine:2150; Blood:50] Intake/Output this shift: No intake/output data recorded.  Recent Labs    09/06/19 2112 09/08/19 0300  HGB 12.2 11.4*   Recent Labs    09/06/19 2112 09/08/19 0300  WBC 7.1 10.1  RBC 4.80 4.48  HCT 37.4 35.1*  PLT 245 226   Recent Labs    09/06/19 2112 09/08/19 0300  NA 144 138  K 4.3 3.9  CL 107 105  CO2 28 26  BUN 15 15  CREATININE 0.94 0.81  GLUCOSE 116* 160*  CALCIUM 8.8* 8.7*   No results for input(s): LABPT, INR in the last 72 hours.  Exam: Pleasant female, alert and oriented.  NAD.  Splint intact.  Moves toes well.      Assessment/Plan: -D/c home today if all equipment is delivered (wheelchair with elevated foot rest, walker, bedside commode) -Scripts for percocet and aspirin sent to pharmacy -follow up with Dr Lorin Mercy one week     Benjiman Core 09/08/2019, 12:24 PM

## 2019-09-08 NOTE — Discharge Instructions (Signed)
Do not remove splint/dressing or get wet  Must elevate right foot above heart level as frequent as possible to help decrease pain and swelling  Absolutely no driving  Must be strict nonweightbearing right lower extremity  If you have increased pain or swelling you should contact our office immediately and/or go to the emergency room

## 2019-09-08 NOTE — Progress Notes (Signed)
Patient discharged to home w/ family. Given all belongings, instructions, equipment. Verbalized understanding of instructions. Escorted to pov via own w/c.

## 2019-09-08 NOTE — Progress Notes (Signed)
Wheelchair Justification Note  Patient Details Name: Alyssa Armstrong MRN: [id number] DOB: January 05, 1951 Today's Date: 09/08/2019   History of Present Illness Patient is 69 y.o. female with PMH significant for HTN, DM, GERD, depression. Patient admitted to [facility] on 09/06/19 after a fall at home. Patient was outside with her dogs and trying to separate them.  Her sister unfortunately fell down to the ground but landed on her ankle and lower leg. X-ray revealed trimalleolar fracture and pt now s/p ORIF Rt trimalleolar ankle fracture with fixation of all 3 components on 09/07/19.    Patient recently suffered a fall and is now POD 1 s/p ORIF for Rt trimalleolar ankle fracture with fixation of all 3 components. She is to remain NWB on Rt LE which impairs their ability to perform daily activities like ambulating community and household distances.  A walker alone will not resolve the issues with performing activities of daily living. A wheelchair with Rt LE elevating foot rest will allow patient to safely perform daily activities. The patient can self propel in the home or has a caregiver who can provide assistance.     Verner Mould, DPT Acute Rehabilitation Services  Office [phone] Pager [phone]  09/08/2019 1:03 PM

## 2019-09-08 NOTE — Evaluation (Signed)
Physical Therapy Evaluation Patient Details Name: Alyssa Armstrong MRN: [id number] DOB: 1950/11/16 Today's Date: 09/08/2019   History of Present Illness  Patient is 69 y.o. female with PMH significant for HTN, DM, GERD, depression. Patient admitted to [facility] on 09/06/19 after a fall at home. Patient was outside with her dogs and trying to separate them.  Her sister unfortunately fell down to the ground but landed on her ankle and lower leg. X-ray revealed trimalleolar fracture and pt now s/p ORIF Rt trimalleolar ankle fracture with fixation of all 3 components on 09/07/19.    Clinical Impression  Alyssa Armstrong is a 69 y.o. female POD 1 s/p Rt ankle ORIF. Patient reports independence with mobility at baseline. Patient is now limited by functional impairments (see PT problem list below) and requires min guard for transfers and gait with RW. She was able to maintain NWB on Rt LE throughout all mobility today. Patient was able to ambulate ~2x15 feet with RW and min guard/assist. Patient will benefit from continued skilled PT interventions to address impairments and progress towards PLOF. Acute PT will follow to progress mobility and stair training in preparation for safe discharge home. Will follow up for stair training in preparation for safe discharge home.    Follow Up Recommendations Follow surgeon's recommendation for DC plan and follow-up therapies;Supervision for mobility/OOB    Equipment Recommendations  Rolling walker with 5" wheels;3in1 (PT);Wheelchair (measurements PT)    Recommendations for Other Services       Precautions / Restrictions Precautions Precautions: Fall Restrictions Weight Bearing Restrictions: Yes RLE Weight Bearing: Non weight bearing      Mobility  Bed Mobility Overal bed mobility: Modified Independent        General bed mobility comments: sitting EOB with OT when PT arrived.  Transfers Overall transfer level: Needs assistance Equipment used:  Rolling walker (2 wheeled) Transfers: Sit to/from [facility] Sit to Stand: Min guard Stand pivot transfers: Min guard       General transfer comment: VC's for technique with RW, pt able to perform power up with min guard to steady. Pt maintained NWB on Rt LE.   Ambulation/Gait Ambulation/Gait assistance: Min guard;Min assist Gait Distance (Feet): 30 Feet (2x15) Assistive device: Rolling walker (2 wheeled) Gait Pattern/deviations: Step-to pattern ("hop to pattern" Rt LE NWB) Gait velocity: decr   General Gait Details: pt maintained NWB of Rt LE throughout. VC's and intermittent assist to position walker and maintain safe proximity. no overt LOB. pt amb to bathroom and back to recliner..  Stairs         Wheelchair Mobility    Modified Rankin (Stroke Patients Only)       Balance Overall balance assessment: Needs assistance Sitting-balance support: Feet supported Sitting balance-Leahy Scale: Good     Standing balance support: Bilateral upper extremity supported;During functional activity Standing balance-Leahy Scale: Fair Standing balance comment: able to maintain static balance on Lt LE. requires UE support for gait.            Pertinent Vitals/Pain Pain Assessment: No/denies pain    Home Living Family/patient expects to be discharged to:: Private residence Living Arrangements: Spouse/significant other;Children Available Help at Discharge: Family;Available 24 hours/day Type of Home: House Home Access: Stairs to enter Entrance Stairs-Rails: Right Entrance Stairs-Number of Steps: 2 Home Layout: One level Home Equipment: None      Prior Function Level of Independence: Independent               Hand Dominance  Dominant Hand: Right    Extremity/Trunk Assessment   Upper Extremity Assessment Upper Extremity Assessment: Defer to OT evaluation    Lower Extremity Assessment Lower Extremity Assessment: Overall WFL for tasks  assessed;RLE deficits/detail RLE Deficits / Details: pt with splint on Rt LE. pt reporting tingling from knee down and unable to wiggle toes.    Cervical / Trunk Assessment Cervical / Trunk Assessment: Normal  Communication   Communication: No difficulties  Cognition Arousal/Alertness: Awake/alert Behavior During Therapy: WFL for tasks assessed/performed Overall Cognitive Status: Within Functional Limits for tasks assessed             General Comments      Exercises     Assessment/Plan    PT Assessment Patient needs continued PT services  PT Problem List Decreased strength;Decreased range of motion;Decreased activity tolerance;Decreased balance;Decreased mobility;Decreased knowledge of use of DME;Decreased knowledge of precautions       PT Treatment Interventions DME instruction;Gait training;Stair training;Functional mobility training;Therapeutic activities;Therapeutic exercise;Balance training;Patient/family education    PT Goals (Current goals can be found in the Care Plan section)  Acute Rehab PT Goals Patient Stated Goal: get home today PT Goal Formulation: With patient Time For Goal Achievement: 09/15/19 Potential to Achieve Goals: Good    Frequency Min 5X/week   Barriers to discharge        Co-evaluation PT/OT/SLP Co-Evaluation/Treatment: Yes Reason for Co-Treatment: For patient/therapist safety PT goals addressed during session: Mobility/safety with mobility;Balance;Proper use of DME OT goals addressed during session: ADL's and self-care       AM-PAC PT "6 Clicks" Mobility  Outcome Measure Help needed turning from your back to your side while in a flat bed without using bedrails?: None Help needed moving from lying on your back to sitting on the side of a flat bed without using bedrails?: None Help needed moving to and from a bed to a chair (including a wheelchair)?: A Little Help needed standing up from a chair using your arms (e.g., wheelchair or  bedside chair)?: A Little Help needed to walk in hospital room?: A Little Help needed climbing 3-5 steps with a railing? : A Lot 6 Click Score: 19    End of Session Equipment Utilized During Treatment: Gait belt Activity Tolerance: Patient tolerated treatment well Patient left: in chair;with call bell/phone within reach;with chair alarm set;with family/visitor present Nurse Communication: Mobility status PT Visit Diagnosis: Muscle weakness (generalized) (M62.81);Difficulty in walking, not elsewhere classified (R26.2)    Time: 7915-0569 PT Time Calculation (min) (ACUTE ONLY): 21 min   Charges:   PT Evaluation $PT Eval Low Complexity: 1 Low          Verner Mould, DPT Acute Rehabilitation Services  Office [phone] Pager [phone]  09/08/2019 12:46 PM

## 2019-09-09 ENCOUNTER — Encounter (HOSPITAL_COMMUNITY): Payer: Self-pay | Admitting: Orthopaedic Surgery

## 2019-09-13 ENCOUNTER — Telehealth: Payer: Self-pay | Admitting: Orthopaedic Surgery

## 2019-09-13 NOTE — Telephone Encounter (Signed)
I called patient. She states that the wheelchair that was delivered to her post op does not have the safety wheels with it and her friend was going to have to go back to the supply facility in [facility] and pick them up.  She would like for [place] to call facility and see if they can either deliver the wheels to her home or to our office for patient appt on Thursday.

## 2019-09-13 NOTE — Telephone Encounter (Signed)
Pt would like for [place] to try and get this office to the pt back in regards to the pt medical supplies.   [phone]  [phone]

## 2019-09-14 NOTE — Telephone Encounter (Signed)
I have called x 2 today and been on hold 15 minutes each time. Will have to try again later.

## 2019-09-15 ENCOUNTER — Telehealth: Payer: Self-pay | Admitting: Orthopaedic Surgery

## 2019-09-15 ENCOUNTER — Encounter: Payer: Self-pay | Admitting: Surgery

## 2019-09-15 ENCOUNTER — Ambulatory Visit (INDEPENDENT_AMBULATORY_CARE_PROVIDER_SITE_OTHER): Payer: Medicare Other | Admitting: Surgery

## 2019-09-15 ENCOUNTER — Telehealth: Payer: Self-pay | Admitting: Surgery

## 2019-09-15 VITALS — BP 141/66 | HR 72 | Ht 64.0 in | Wt 190.0 lb

## 2019-09-15 DIAGNOSIS — Z8781 Personal history of (healed) traumatic fracture: Secondary | ICD-10-CM

## 2019-09-15 DIAGNOSIS — Z9889 Other specified postprocedural states: Secondary | ICD-10-CM

## 2019-09-15 NOTE — Telephone Encounter (Signed)
Patient called.   She is requesting she prescribed something a little more mild for her pain. Said her current medication is causing her to throw up.   Call back: [phone]

## 2019-09-15 NOTE — Progress Notes (Signed)
69 year old black female who is 8 days status post right ankle ORIF returns.  States that she is doing well.  Not taking much pain medication.  Has been very compliant with foot elevation.   Exam Extremely pleasant black female alert and oriented in no acute distress.  Wounds look good.  No drainage or signs of infection.  Staples intact.  Not much by the way of swelling.  Calf nontender. Neurovascular intact.  Plan New splint applied.  Continue strict nonweightbearing.  Patient will follow-up with me in 1 week for wound check and likely staple removal and cast application.  Continue to elevate foot above heart level to decrease pain and swelling.  Continue aspirin 325 mg daily for postop DVT prophylaxis.  All questions answered.

## 2019-09-15 NOTE — Telephone Encounter (Signed)
Patient asked if you could give her a call tomorrow morning when your back in the office. She was asking about the wheels for her wheelchair.

## 2019-09-16 NOTE — Telephone Encounter (Signed)
Called and left a VM advising patient of Dr. Lorin Mercy message.  Advised patient to call back with any concerns or questions.

## 2019-09-16 NOTE — Telephone Encounter (Signed)
Tried to call her back , no answer , she can take one half of a tablet. Take it with food. All pain meds are narcotics and taking less will take care of it. Ucall.

## 2019-09-16 NOTE — Telephone Encounter (Signed)
Duplicate message in chart. Someone from logistics with [facility] will call pt to set up time for delivery of the safety wheels to her home. I called patient to advise but no answer and voicemail is not set up. Please let patient know if she returns call. Thanks.

## 2019-09-16 NOTE — Telephone Encounter (Signed)
I spoke with Burna Mortimer at [facility] who entered ticket. She states that someone with their logistics team will call patient and set up appt for someone to deliver safety wheels for wheelchair to her home. I called patient to advise, but no voicemail set up to leave message. Please advise patient if she returns call. Thanks.

## 2019-09-16 NOTE — Telephone Encounter (Signed)
Please advise on message below. Thank you!

## 2019-09-19 NOTE — Discharge Summary (Signed)
Patient ID: Wilda Wetherell MRN: [id number] DOB/AGE: July 28, 1950 69 y.o.  Admit date: 09/07/2019 Discharge date: September 08, 2019 Admission Diagnoses:  Active Problems:   Trimalleolar fracture of ankle, closed   Closed right ankle fracture   Discharge Diagnoses:  Active Problems:   Trimalleolar fracture of ankle, closed   Closed right ankle fracture  status post Procedure(s): OPEN REDUCTION INTERNAL FIXATION (ORIF), RIGHT MALLEOLAR ANKLE FRACTURE  Past Medical History:  Diagnosis Date  . Bronchitis   . Depression 36YRS AGO  . Diabetes mellitus without complication ([place])   . GERD (gastroesophageal reflux disease)   . Hypertension     Surgeries: Procedure(s): OPEN REDUCTION INTERNAL FIXATION (ORIF), RIGHT MALLEOLAR ANKLE FRACTURE on 09/07/2019   Consultants:   Discharged Condition: Improved  Hospital Course: Karol Skarzynski is an 70 y.o. female who was admitted 09/07/2019 for operative treatment of <principal problem not specified>. Patient failed conservative treatments (please see the history and physical for the specifics) and had severe unremitting pain that affects sleep, daily activities and work/hobbies. After pre-op clearance, the patient was taken to the operating room on 09/07/2019 and underwent  Procedure(s): OPEN REDUCTION INTERNAL FIXATION (ORIF), RIGHT MALLEOLAR ANKLE FRACTURE.    Patient was given perioperative antibiotics:  Anti-infectives (From admission, onward)   Start     Dose/Rate Route Frequency Ordered Stop   09/07/19 1438  ceFAZolin (ANCEF) 2-4 GM/100ML-% IVPB       Note to Pharmacy: Danley Danker   : cabinet override      09/07/19 1438 09/07/19 1746   09/07/19 1438  vancomycin (VANCOCIN) 1-5 GM/200ML-% IVPB  Status:  Discontinued       Note to Pharmacy: Danley Danker   : cabinet override      09/07/19 1438 09/07/19 1450       Patient was given sequential compression devices and early ambulation to prevent DVT.   Patient benefited  maximally from hospital stay and there were no complications. At the time of discharge, the patient was urinating/moving their bowels without difficulty, tolerating a regular diet, pain is controlled with oral pain medications and they have been cleared by PT/OT.   Recent vital signs: No data found.   Recent laboratory studies: No results for input(s): WBC, HGB, HCT, PLT, NA, K, CL, CO2, BUN, CREATININE, GLUCOSE, INR, CALCIUM in the last 72 hours.  Invalid input(s): PT, 2   Discharge Medications:   Allergies as of 09/08/2019      Reactions   Vicodin [hydrocodone-acetaminophen] Nausea And Vomiting, Other (See Comments)   severe   Lisinopril Cough      Medication List    STOP taking these medications   ibuprofen 200 MG tablet Commonly known as: ADVIL   meloxicam 15 MG tablet Commonly known as: MOBIC   oxyCODONE 5 MG immediate release tablet Commonly known as: Oxy IR/ROXICODONE     TAKE these medications   aspirin 325 MG tablet Take 1 tablet (325 mg total) by mouth daily. Must take at least 4 weeks postop for DVT prophylaxis   atorvastatin 40 MG tablet Commonly known as: LIPITOR Take 40 mg by mouth daily.   buPROPion 150 MG 24 hr tablet Commonly known as: WELLBUTRIN XL Take 150 mg by mouth daily.   calcium-vitamin D 500-200 MG-UNIT Tabs tablet Commonly known as: OSCAL WITH D Take 1 tablet by mouth daily.   cetirizine 10 MG tablet Commonly known as: ZYRTEC Take 1 tablet (10 mg total) by mouth daily. What changed:   when to take  this  reasons to take this   clonazePAM 0.5 MG tablet Commonly known as: KLONOPIN Take 0.25-0.5 mg by mouth 2 (two) times daily as needed for anxiety.   ergocalciferol 1.25 MG (50000 UT) capsule Commonly known as: VITAMIN D2 Take 50,000 Units by mouth once a week. Mondays   ezetimibe 10 MG tablet Commonly known as: ZETIA Take 10 mg by mouth daily.   famotidine 20 MG tablet Commonly known as: PEPCID Take 20 mg by mouth daily.    ICAPS AREDS 2 PO Take 1 tablet by mouth daily.   losartan 50 MG tablet Commonly known as: COZAAR Take 50 mg by mouth daily.   oxyCODONE-acetaminophen 5-325 MG tablet Commonly known as: Percocet Take 1-2 tablets by mouth every 6 (six) hours as needed for severe pain.   polyethylene glycol 17 g packet Commonly known as: MIRALAX / GLYCOLAX Take 17 g by mouth as needed for mild constipation.   venlafaxine 75 MG tablet Commonly known as: EFFEXOR Take 75 mg by mouth 2 (two) times daily.       Diagnostic Studies: DG Tibia/Fibula Right  Result Date: 09/06/2019 CLINICAL DATA:  Fall with obvious deformity EXAM: RIGHT TIBIA AND FIBULA - 2 VIEW COMPARISON:  None. FINDINGS: Moderate plantar calcaneal spur. No acute displaced fracture involving the proximal to mid tibia or fibula. Acute comminuted distal fibular fracture with moderate lateral angulation and close to 1 shaft diameter lateral displacement of the talar dome with respect to the distal tibia. Acute comminuted medial malleolar fracture. IMPRESSION: Acute comminuted distal fibular and medial malleolar fractures with lateral displacement and angulation of the talar dome with respect to the distal tibia. Electronically Signed   By: Donavan Foil M.D.   On: 09/06/2019 18:21   DG Ankle 2 Views Right  Result Date: 09/07/2019 CLINICAL DATA:  ORIF ankle fracture EXAM: RIGHT ANKLE - 2 VIEW COMPARISON:  Radiograph 09/06/2019 FINDINGS: Fluoro time: 16 seconds Exposure index: 0.58 mGy Views obtained: 2 Operative images depict interval placement of a lateral side plate and screw fixation traversing the comminuted distal fibular fracture. Obliquely oriented cannulated screws transfixing the medial malleolar fracture and a single anterior to posteriorly oriented cannulated screw transfixing the posterior malleolar fracture. Markedly improved alignment which is near anatomic following ORIF. Ankle mortise is grossly congruent though incompletely assessed  in the absence of a mortise view. Expected surgical soft tissue changes. No hardware complication or failure. IMPRESSION: 1. ORIF of distal fibular, medial and posterior malleolar fractures without evidence of hardware complication. Markedly improved alignment following ORIF. Electronically Signed   By: Lovena Le M.D.   On: 09/07/2019 18:40   DG Ankle 2 Views Right  Result Date: 09/06/2019 CLINICAL DATA:  Fall with deformity EXAM: RIGHT ANKLE - 2 VIEW COMPARISON:  None. FINDINGS: Acute comminuted medial malleolar fracture. Acute comminuted distal fibular fracture with greater than 1 shaft diameter lateral displacement of distal fibular fracture fragment. Moderate lateral angulation of distal fibular fracture fragment as well. There is lateral dislocation of the talar dome by about 1 shaft diameter with respect to the distal tibia. There is widening of the anterior ankle joint with mild posterior positioning of the talar dome with respect to the distal tibia. Limited assessment for posterior malleolar fracture due to osseous overlap. IMPRESSION: Acute displaced and angulated distal fibular fracture with additional medial malleolar fracture. There is lateral and posterior displacement of the talar dome with respect to the distal tibia. Electronically Signed   By: Madie Reno.D.  On: 09/06/2019 18:26   DG Ankle Right Port  Result Date: 09/06/2019 CLINICAL DATA:  Bimalleolar fracture dislocation status post reduction EXAM: PORTABLE RIGHT ANKLE - 2 VIEW COMPARISON:  09/06/2019 at 6:01 p.m. FINDINGS: Frontal and lateral views of the right ankle are obtained. There is been reduction of the tibiotalar dislocation, with persistent 1.4 cm lateral translation of the talus relative to the tibial plafond. Comminuted oblique distal fibular diaphyseal fracture demonstrates improved alignment, with 0.7 cm of residual lateral translation of the distal fracture fragment. Slight reduction of the medial malleolar  fracture with significant residual lateral displacement. There is diffuse soft tissue swelling. Overlying cast material obscures underlying bony detail. IMPRESSION: 1. Reduction of the tibiotalar dislocation seen previously, with residual lateral translation of the talus relative to the tibial plafond as above. 2. Slight reduction in the medial and lateral malleolar fractures as above, with significant residual lateral displacement. Electronically Signed   By: Randa Ngo M.D.   On: 09/06/2019 21:26   DG C-Arm 1-60 Min-No Report  Result Date: 09/07/2019 Fluoroscopy was utilized by the requesting physician.  No radiographic interpretation.       Follow-up Information    Marybelle Killings, MD. Schedule an appointment as soon as possible for a visit today.   Specialty: Orthopedic Surgery Why: Need return office visit 1 week postop.  Call to schedule Contact information: [place] [address] [phone]        Health, [facility] Follow up.   Specialty: [place] Why: to provide home health physical therapy Contact information: [place] [address] [phone]               Discharge Plan:  discharge to home  Disposition:     Signed: Benjiman Core for Rodell Perna, MD 09/19/2019, 3:26 PM

## 2019-09-22 ENCOUNTER — Ambulatory Visit (INDEPENDENT_AMBULATORY_CARE_PROVIDER_SITE_OTHER): Payer: Medicare Other | Admitting: Surgery

## 2019-09-22 ENCOUNTER — Encounter: Payer: Self-pay | Admitting: Surgery

## 2019-09-22 VITALS — BP 145/72 | HR 83 | Ht 64.0 in

## 2019-09-22 DIAGNOSIS — Z9889 Other specified postprocedural states: Secondary | ICD-10-CM

## 2019-09-22 DIAGNOSIS — Z8781 Personal history of (healed) traumatic fracture: Secondary | ICD-10-CM

## 2019-09-22 MED ORDER — HYDROCODONE-ACETAMINOPHEN 5-325 MG PO TABS: 1.0000 | ORAL_TABLET | Freq: Four times a day (QID) | ORAL | 0 refills | Status: DC | PRN

## 2019-09-22 NOTE — Progress Notes (Signed)
69 year old female returns for wound check and staple removal.  States that she is doing well.  Has had some nausea from the Percocet.  Exam Wound looks good.  Staples removed and Steri-Strips applied.  No drainage or signs of infection.  Splint was reapplied with good padding.  Calf nontender.   Plan Patient will follow up in 1 week again for wound check and we will see about applying short leg fiberglass cast at that time.  Continue foot elevation.  Sent in prescription for Norco and she will discontinue Percocet.

## 2019-09-30 ENCOUNTER — Ambulatory Visit (INDEPENDENT_AMBULATORY_CARE_PROVIDER_SITE_OTHER): Payer: Medicare Other | Admitting: Orthopaedic Surgery

## 2019-09-30 ENCOUNTER — Encounter: Payer: Self-pay | Admitting: Orthopaedic Surgery

## 2019-09-30 ENCOUNTER — Other Ambulatory Visit: Payer: Self-pay | Admitting: Surgery

## 2019-09-30 VITALS — Ht 64.0 in | Wt 190.0 lb

## 2019-09-30 DIAGNOSIS — S82851D Displaced trimalleolar fracture of right lower leg, subsequent encounter for closed fracture with routine healing: Secondary | ICD-10-CM | POA: Diagnosis not present

## 2019-09-30 NOTE — Progress Notes (Signed)
   Post-Op Visit Note   Patient: Alyssa Armstrong           Date of Birth: [birth year]           MRN: [id number] Visit Date: 09/30/2019 PCP: Bernerd Limbo, MD   Assessment & Plan: Post medial lateral and posterior malleolar fixation right trimalleolar ankle fracture right.  Steri-Strips were applied incision looks good she is placed in a short leg fiberglass cast.  Return office visit 4 weeks for cast off Steri-Strip removal and cam boot application with stockinette.  Chief Complaint:  Chief Complaint  Patient presents with  . Right Ankle - Wound Check    09/07/2019 ORIF right ankle fx   Visit Diagnoses:  1. Closed trimalleolar fracture of right ankle with routine healing, subsequent encounter     Plan: SLFC NWB ROV 4 wks  Follow-Up Instructions: No follow-ups on file.   Orders:  No orders of the defined types were placed in this encounter.  No orders of the defined types were placed in this encounter.   Imaging: No results found.  PMFS History: Patient Active Problem List   Diagnosis Date Noted  . Trimalleolar fracture of ankle, closed 09/07/2019  . Closed right ankle fracture 09/07/2019  . Chronic prescription benzodiazepine use 04/11/2019  . Lymphocytosis 07/08/2018  . Sensorineural hearing loss (SNHL) of both ears 03/20/2016  . Combined hyperlipidemia associated with type 2 diabetes mellitus ([place]) 05/09/2013  . Menopausal hot flushes 05/09/2013  . HTN (hypertension) 09/07/2012  . Dyslipidemia 09/07/2012  . Type 2 diabetes, diet controlled ([place]) 03/03/2012  . GERD (gastroesophageal reflux disease) 04/28/2011  . Seasonal allergies 04/28/2011  . Vitamin D deficiency 04/28/2011  . Major depression, recurrent, chronic ([place]) 06/14/2010  . PES PLANUS 02/28/2008  . Acquired flat foot 02/28/2008   Past Medical History:  Diagnosis Date  . Bronchitis   . Depression 42YRS AGO  . Diabetes mellitus without complication ([place])   . GERD (gastroesophageal reflux disease)    . Hypertension     Family History  Problem Relation Age of Onset  . Diabetes Mother   . Heart disease Mother   . Diabetes Father   . Cancer Father        LUNG  . Hyperlipidemia Brother     Past Surgical History:  Procedure Laterality Date  . ABDOMINAL HYSTERECTOMY     PARTIAL  . BUNIONECTOMY  2009  . ORIF ANKLE FRACTURE Bilateral 09/07/2019   Procedure: OPEN REDUCTION INTERNAL FIXATION (ORIF), RIGHT MALLEOLAR ANKLE FRACTURE;  Surgeon: Marybelle Killings, MD;  Location: [facility];  Service: Orthopedics;  Laterality: Bilateral;  . ROTATOR CUFF REPAIR  2008  . rotator cuff suregery     Social History   Occupational History  . Not on file  Tobacco Use  . Smoking status: Never Smoker  . Smokeless tobacco: Never Used  Substance and Sexual Activity  . Alcohol use: Yes  . Drug use: No  . Sexual activity: Yes

## 2019-09-30 NOTE — Telephone Encounter (Signed)
Please advise 

## 2019-09-30 NOTE — Telephone Encounter (Signed)
OK to stop.

## 2019-11-01 ENCOUNTER — Ambulatory Visit: Payer: Self-pay

## 2019-11-01 ENCOUNTER — Encounter: Payer: Self-pay | Admitting: Orthopaedic Surgery

## 2019-11-01 ENCOUNTER — Ambulatory Visit (INDEPENDENT_AMBULATORY_CARE_PROVIDER_SITE_OTHER): Payer: Medicare Other | Admitting: Orthopaedic Surgery

## 2019-11-01 VITALS — Ht 64.0 in | Wt 190.0 lb

## 2019-11-01 DIAGNOSIS — S82851D Displaced trimalleolar fracture of right lower leg, subsequent encounter for closed fracture with routine healing: Secondary | ICD-10-CM

## 2019-11-01 MED ORDER — CEPHALEXIN 500 MG PO CAPS
500.0000 mg | ORAL_CAPSULE | Freq: Four times a day (QID) | ORAL | 0 refills | Status: DC
Start: 2019-11-01 — End: 2020-04-12

## 2019-11-01 MED ORDER — HYDROCODONE-ACETAMINOPHEN 5-325 MG PO TABS: 1.0000 | ORAL_TABLET | Freq: Four times a day (QID) | ORAL | 0 refills | Status: DC | PRN

## 2019-11-01 NOTE — Progress Notes (Signed)
Post-Op Visit Note   Patient: Alyssa Armstrong           Date of Birth: 1950/03/17           MRN: [id number] Visit Date: 11/01/2019 PCP: Bernerd Limbo, MD   Assessment & Plan:  Chief Complaint:  Chief Complaint  Patient presents with  . Right Ankle - Follow-up    09/07/2019 ORIF Right ankle fx   69 year old who is a week status post ORIF right ankle fracture returns.  States that she is doing well.  Needs refill of pain medication.  She has been working with home health PT.  States that the therapist is wanting to know about her weightbearing restrictions.   Visit Diagnoses:  1. Closed trimalleolar fracture of right ankle with routine healing, subsequent encounter     Plan: Today short leg fiberglass cast was removed and patient put into a cam boot.  She can 50% partial weightbearing boot only.  Put on Keflex 500 mg every 6 hours x10 days for prophylaxis.  Patient does have history of diabetes.  She will follow up in 1 week for wound check.  Follow-Up Instructions: Return in about 1 week (around 11/08/2019) for Right ankle wound check with  Ovadia Lopp.   Orders:  Orders Placed This Encounter  Procedures  . XR Ankle Complete Right   Meds ordered this encounter  Medications  . HYDROcodone-acetaminophen (NORCO/VICODIN) 5-325 MG tablet    Sig: Take 1 tablet by mouth every 6 (six) hours as needed for moderate pain.    Dispense:  40 tablet    Refill:  0  . cephALEXin (KEFLEX) 500 MG capsule    Sig: Take 1 capsule (500 mg total) by mouth 4 (four) times daily. Take x10 days    Dispense:  40 capsule    Refill:  0    Imaging: No results found.  PMFS History: Patient Active Problem List   Diagnosis Date Noted  . Trimalleolar fracture of ankle, closed 09/07/2019  . Closed right ankle fracture 09/07/2019  . Chronic prescription benzodiazepine use 04/11/2019  . Lymphocytosis 07/08/2018  . Sensorineural hearing loss (SNHL) of both ears 03/20/2016  . Combined hyperlipidemia  associated with type 2 diabetes mellitus ([place]) 05/09/2013  . Menopausal hot flushes 05/09/2013  . HTN (hypertension) 09/07/2012  . Dyslipidemia 09/07/2012  . Type 2 diabetes, diet controlled ([place]) 03/03/2012  . GERD (gastroesophageal reflux disease) 04/28/2011  . Seasonal allergies 04/28/2011  . Vitamin D deficiency 04/28/2011  . Major depression, recurrent, chronic ([employer]) 06/14/2010  . PES PLANUS 02/28/2008  . Acquired flat foot 02/28/2008   Past Medical History:  Diagnosis Date  . Bronchitis   . Depression 39YRS AGO  . Diabetes mellitus without complication ([place])   . GERD (gastroesophageal reflux disease)   . Hypertension     Family History  Problem Relation Age of Onset  . Diabetes Mother   . Heart disease Mother   . Diabetes Father   . Cancer Father        LUNG  . Hyperlipidemia Brother     Past Surgical History:  Procedure Laterality Date  . ABDOMINAL HYSTERECTOMY     PARTIAL  . BUNIONECTOMY  2009  . ORIF ANKLE FRACTURE Bilateral 09/07/2019   Procedure: OPEN REDUCTION INTERNAL FIXATION (ORIF), RIGHT MALLEOLAR ANKLE FRACTURE;  Surgeon: Marybelle Killings, MD;  Location: [facility];  Service: Orthopedics;  Laterality: Bilateral;  . ROTATOR CUFF REPAIR  2008  . rotator cuff suregery     Social  History   Occupational History  . Not on file  Tobacco Use  . Smoking status: Never Smoker  . Smokeless tobacco: Never Used  Substance and Sexual Activity  . Alcohol use: Yes  . Drug use: No  . Sexual activity: Yes   Exam Mid lateral incision there is a area of about 2 to 3 mm with bloody drainage.  Nothing purulent.  The rest of the lateral incision along with the medial incision is well-healed.

## 2019-11-10 ENCOUNTER — Ambulatory Visit (INDEPENDENT_AMBULATORY_CARE_PROVIDER_SITE_OTHER): Payer: Medicare Other | Admitting: Surgery

## 2019-11-10 ENCOUNTER — Encounter: Payer: Self-pay | Admitting: Surgery

## 2019-11-10 VITALS — BP 141/74 | HR 93

## 2019-11-10 DIAGNOSIS — Z9889 Other specified postprocedural states: Secondary | ICD-10-CM

## 2019-11-10 DIAGNOSIS — Z8781 Personal history of (healed) traumatic fracture: Secondary | ICD-10-CM

## 2019-11-10 MED ORDER — CEPHALEXIN 500 MG PO CAPS
500.0000 mg | ORAL_CAPSULE | Freq: Four times a day (QID) | ORAL | 0 refills | Status: DC
Start: 2019-11-10 — End: 2020-04-12

## 2019-11-11 ENCOUNTER — Telehealth: Payer: Self-pay | Admitting: Radiology

## 2019-11-11 NOTE — Progress Notes (Signed)
69 year old black female returns for right ankle wound check.  She is 2 months status post ORIF right ankle fracture.  She has been taking Keflex as previously prescribed.  Exam She continues to have small wound that has not healed yet.  Unchanged from last office visit.  Today has some serous and bloody drainage.  Nothing purulent at the time of me seeing her.  She has had some drainage on her dressings.  Calf is nontender.   Plan I did speak with Dr. Lorin Mercy by phone regarding this.  He wanted to see about getting one of Dr. Jess Barters wound socks to see if this will help with healing.  Autumn Dr. Jess Barters assistant looked at patient and measure her for the appropriate sock.  Autumn gave patient instructions on use of this.  Patient will continue to wear her cam boot and limit her weightbearing to touchdown.  Follow-up with Dr. Lorin Mercy in 1 week for wound check.  Continue Keflex.

## 2019-11-11 NOTE — Telephone Encounter (Signed)
I called [place] and advised.

## 2019-11-11 NOTE — Telephone Encounter (Signed)
Alyssa Armstrong with [facility] requests verbal orders to add [facility] to monitor wound lateral side of left ankle. She would also like clarification as to whether they can continue with PT or hold for now?  Can you please advise? Jeneen Rinks saw patient yesterday, however, note is not in chart yet. Thanks.  CB for Constance Haw is [phone]

## 2019-11-11 NOTE — Telephone Encounter (Signed)
OK HHRN 2Xper week times 2 wks.   OK to put PT on hold. Patient needs foot elevated to decrease swelling

## 2019-11-18 ENCOUNTER — Ambulatory Visit (INDEPENDENT_AMBULATORY_CARE_PROVIDER_SITE_OTHER): Payer: Medicare Other | Admitting: Orthopaedic Surgery

## 2019-11-18 VITALS — BP 138/75 | HR 101 | Ht 64.0 in | Wt 190.0 lb

## 2019-11-18 DIAGNOSIS — S82851D Displaced trimalleolar fracture of right lower leg, subsequent encounter for closed fracture with routine healing: Secondary | ICD-10-CM

## 2019-11-18 NOTE — Progress Notes (Signed)
   Post-Op Visit Note   Patient: Alyssa Armstrong           Date of Birth: 11-Mar-1950           MRN: [id number] Visit Date: 11/18/2019 PCP: Bernerd Limbo, MD   Assessment & Plan: Patient returns.  Support stocking is healed her lateral fibular incision.  She will apply soap water and lotion and needs to switch out to her other sock since she is not changed her sock in a week.  She can finish off antibiotics and stop it.  I will check her in 2 weeks.  Soap water lotion and progressive weightbearing discussed.  Chief Complaint:  Chief Complaint  Patient presents with  . Right Ankle - Routine Post Op   Visit Diagnoses:  1. Closed trimalleolar fracture of right ankle with routine healing, subsequent encounter     Plan: ROV 2 wks  Follow-Up Instructions: Return in about 2 weeks (around 12/02/2019).   Orders:  No orders of the defined types were placed in this encounter.  No orders of the defined types were placed in this encounter.   Imaging: No results found.  PMFS History: Patient Active Problem List   Diagnosis Date Noted  . Trimalleolar fracture of ankle, closed 09/07/2019  . Closed right ankle fracture 09/07/2019  . Chronic prescription benzodiazepine use 04/11/2019  . Lymphocytosis 07/08/2018  . Sensorineural hearing loss (SNHL) of both ears 03/20/2016  . Combined hyperlipidemia associated with type 2 diabetes mellitus ([place]) 05/09/2013  . Menopausal hot flushes 05/09/2013  . HTN (hypertension) 09/07/2012  . Dyslipidemia 09/07/2012  . Type 2 diabetes, diet controlled ([place]) 03/03/2012  . GERD (gastroesophageal reflux disease) 04/28/2011  . Seasonal allergies 04/28/2011  . Vitamin D deficiency 04/28/2011  . Major depression, recurrent, chronic ([place]) 06/14/2010  . PES PLANUS 02/28/2008  . Acquired flat foot 02/28/2008   Past Medical History:  Diagnosis Date  . Bronchitis   . Depression 44YRS AGO  . Diabetes mellitus without complication ([place])   . GERD  (gastroesophageal reflux disease)   . Hypertension     Family History  Problem Relation Age of Onset  . Diabetes Mother   . Heart disease Mother   . Diabetes Father   . Cancer Father        LUNG  . Hyperlipidemia Brother     Past Surgical History:  Procedure Laterality Date  . ABDOMINAL HYSTERECTOMY     PARTIAL  . BUNIONECTOMY  2009  . ORIF ANKLE FRACTURE Bilateral 09/07/2019   Procedure: OPEN REDUCTION INTERNAL FIXATION (ORIF), RIGHT MALLEOLAR ANKLE FRACTURE;  Surgeon: Marybelle Killings, MD;  Location: [facility];  Service: Orthopedics;  Laterality: Bilateral;  . ROTATOR CUFF REPAIR  2008  . rotator cuff suregery     Social History   Occupational History  . Not on file  Tobacco Use  . Smoking status: Never Smoker  . Smokeless tobacco: Never Used  Substance and Sexual Activity  . Alcohol use: Yes  . Drug use: No  . Sexual activity: Yes

## 2019-12-02 ENCOUNTER — Encounter: Payer: Self-pay | Admitting: Orthopaedic Surgery

## 2019-12-02 ENCOUNTER — Ambulatory Visit: Payer: Medicare Other | Admitting: Orthopaedic Surgery

## 2019-12-02 VITALS — BP 136/77 | HR 80 | Ht 64.0 in | Wt 194.0 lb

## 2019-12-02 DIAGNOSIS — S82851D Displaced trimalleolar fracture of right lower leg, subsequent encounter for closed fracture with routine healing: Secondary | ICD-10-CM

## 2019-12-02 NOTE — Progress Notes (Signed)
   Post-Op Visit Note   Patient: Alyssa Armstrong           Date of Birth: Dec 23, 1950           MRN: [id number] Visit Date: 12/02/2019 PCP: Bernerd Limbo, MD   Assessment & Plan: Follow-up trimalleolar ankle fracture 09/07/2019 right ankle.  She had problems with some wound healing with edema she has type 2 diabetes.  She has been wearing the Dr. Sharol Given stocking.  Medial lateral wounds of healed up.  She continued cam boot can progress to weightbearing as tolerated and when she is weightbearing fully without pain or discomfort she can switch and go to a cane in the left hand.  I will check her back again in a month for final x-rays.  Chief Complaint:  Chief Complaint  Patient presents with  . Right Ankle - Wound Check    7/21/20210 ORIF Right Ankle Fx   Visit Diagnoses:  1. Closed trimalleolar fracture of right ankle with routine healing, subsequent encounter     Plan: Return 1 month three-view x-rays right ankle on return.  Follow-Up Instructions: Return in about 1 month (around 01/02/2020).   Orders:  No orders of the defined types were placed in this encounter.  No orders of the defined types were placed in this encounter.   Imaging: No results found.  PMFS History: Patient Active Problem List   Diagnosis Date Noted  . Trimalleolar fracture of ankle, closed 09/07/2019  . Closed right ankle fracture 09/07/2019  . Chronic prescription benzodiazepine use 04/11/2019  . Lymphocytosis 07/08/2018  . Sensorineural hearing loss (SNHL) of both ears 03/20/2016  . Combined hyperlipidemia associated with type 2 diabetes mellitus ([place]) 05/09/2013  . Menopausal hot flushes 05/09/2013  . HTN (hypertension) 09/07/2012  . Dyslipidemia 09/07/2012  . Type 2 diabetes, diet controlled ([place]) 03/03/2012  . GERD (gastroesophageal reflux disease) 04/28/2011  . Seasonal allergies 04/28/2011  . Vitamin D deficiency 04/28/2011  . Major depression, recurrent, chronic ([place]) 06/14/2010  . PES  PLANUS 02/28/2008  . Acquired flat foot 02/28/2008   Past Medical History:  Diagnosis Date  . Bronchitis   . Depression 81YRS AGO  . Diabetes mellitus without complication ([place])   . GERD (gastroesophageal reflux disease)   . Hypertension     Family History  Problem Relation Age of Onset  . Diabetes Mother   . Heart disease Mother   . Diabetes Father   . Cancer Father        LUNG  . Hyperlipidemia Brother     Past Surgical History:  Procedure Laterality Date  . ABDOMINAL HYSTERECTOMY     PARTIAL  . BUNIONECTOMY  2009  . ORIF ANKLE FRACTURE Bilateral 09/07/2019   Procedure: OPEN REDUCTION INTERNAL FIXATION (ORIF), RIGHT MALLEOLAR ANKLE FRACTURE;  Surgeon: Marybelle Killings, MD;  Location: [facility];  Service: Orthopedics;  Laterality: Bilateral;  . ROTATOR CUFF REPAIR  2008  . rotator cuff suregery     Social History   Occupational History  . Not on file  Tobacco Use  . Smoking status: Never Smoker  . Smokeless tobacco: Never Used  Substance and Sexual Activity  . Alcohol use: Yes  . Drug use: No  . Sexual activity: Yes

## 2020-01-03 ENCOUNTER — Ambulatory Visit: Payer: Medicare Other | Admitting: Orthopaedic Surgery

## 2020-01-03 ENCOUNTER — Encounter: Payer: Self-pay | Admitting: Orthopaedic Surgery

## 2020-01-03 ENCOUNTER — Ambulatory Visit: Payer: Self-pay

## 2020-01-03 VITALS — BP 146/81 | HR 81 | Ht 64.0 in | Wt 194.0 lb

## 2020-01-03 DIAGNOSIS — S82851D Displaced trimalleolar fracture of right lower leg, subsequent encounter for closed fracture with routine healing: Secondary | ICD-10-CM

## 2020-01-03 NOTE — Progress Notes (Signed)
Office Visit Note   Patient: Alyssa Armstrong           Date of Birth: 01/15/1951           MRN: [id number] Visit Date: 01/03/2020              Requested by: Bernerd Limbo, MD [address] [place],  [place] [id number] PCP: Bernerd Limbo, MD   Assessment & Plan: Visit Diagnoses:  1. Closed trimalleolar fracture of right ankle with routine healing, subsequent encounter     Plan: She can now use a regular tennis shoe which she brought with her continue using her cane and work on normal ambulation in her shoe.  Return in 8 weeks no x-ray needed on return.  Follow-Up Instructions: Return in about 8 weeks (around 02/28/2020).   Orders:  Orders Placed This Encounter  Procedures  . XR Ankle Complete Right   No orders of the defined types were placed in this encounter.     Procedures: No procedures performed   Clinical Data: No additional findings.   Subjective: Chief Complaint  Patient presents with  . Right Ankle - Follow-up    09/06/2019 ORIF right ankle fracture    HPI 69 year old female seen in follow-up after her sister weighs may be 350 pounds plus fell on her right ankle with trimalleolar ankle fracture.  She has not been in a cam boot she is using a cane.  She can stand on her ankle without pain.  Swelling is down.  Review of Systems type 2 diabetes controlled.  All of systems are noncontributory to HPI.   Objective: Vital Signs: BP (!) 146/81   Pulse 81   Ht 5\' 4"  (1.626 m)   Wt 194 lb (88 kg)   BMI 33.30 kg/m   Physical Exam Constitutional:      Appearance: She is well-developed.  HENT:     Head: Normocephalic.     Right Ear: External ear normal.     Left Ear: External ear normal.  Eyes:     Pupils: Pupils are equal, round, and reactive to light.  Neck:     Thyroid: No thyromegaly.     Trachea: No tracheal deviation.  Cardiovascular:     Rate and Rhythm: Normal rate.  Pulmonary:     Effort: Pulmonary effort is normal.    Abdominal:     Palpations: Abdomen is soft.  Skin:    General: Skin is warm and dry.  Neurological:     Mental Status: She is alert and oriented to person, place, and time.  Psychiatric:        Behavior: Behavior normal.     Ortho Exam swelling is down incisions well-healed.  She can stand and weight-bear without pain no tenderness of the fracture site.  Specialty Comments:  No specialty comments available.  Imaging: No results found.   PMFS History: Patient Active Problem List   Diagnosis Date Noted  . Trimalleolar fracture of ankle, closed 09/07/2019  . Closed right ankle fracture 09/07/2019  . Chronic prescription benzodiazepine use 04/11/2019  . Lymphocytosis 07/08/2018  . Sensorineural hearing loss (SNHL) of both ears 03/20/2016  . Combined hyperlipidemia associated with type 2 diabetes mellitus ([place]) 05/09/2013  . Menopausal hot flushes 05/09/2013  . HTN (hypertension) 09/07/2012  . Dyslipidemia 09/07/2012  . Type 2 diabetes, diet controlled ([place]) 03/03/2012  . GERD (gastroesophageal reflux disease) 04/28/2011  . Seasonal allergies 04/28/2011  . Vitamin D deficiency 04/28/2011  . Major  depression, recurrent, chronic ([place]) 06/14/2010  . PES PLANUS 02/28/2008  . Acquired flat foot 02/28/2008   Past Medical History:  Diagnosis Date  . Bronchitis   . Depression 38YRS AGO  . Diabetes mellitus without complication ([place])   . GERD (gastroesophageal reflux disease)   . Hypertension     Family History  Problem Relation Age of Onset  . Diabetes Mother   . Heart disease Mother   . Diabetes Father   . Cancer Father        LUNG  . Hyperlipidemia Brother     Past Surgical History:  Procedure Laterality Date  . ABDOMINAL HYSTERECTOMY     PARTIAL  . BUNIONECTOMY  2009  . ORIF ANKLE FRACTURE Bilateral 09/07/2019   Procedure: OPEN REDUCTION INTERNAL FIXATION (ORIF), RIGHT MALLEOLAR ANKLE FRACTURE;  Surgeon: Marybelle Killings, MD;  Location: [facility];  Service: Orthopedics;   Laterality: Bilateral;  . ROTATOR CUFF REPAIR  2008  . rotator cuff suregery     Social History   Occupational History  . Not on file  Tobacco Use  . Smoking status: Never Smoker  . Smokeless tobacco: Never Used  Substance and Sexual Activity  . Alcohol use: Yes  . Drug use: No  . Sexual activity: Yes

## 2020-02-28 ENCOUNTER — Ambulatory Visit: Payer: Medicare Other | Admitting: Orthopaedic Surgery

## 2020-02-28 ENCOUNTER — Other Ambulatory Visit: Payer: Self-pay

## 2020-02-28 ENCOUNTER — Encounter: Payer: Self-pay | Admitting: Orthopaedic Surgery

## 2020-02-28 VITALS — BP 135/80 | HR 82 | Ht 64.0 in | Wt 194.0 lb

## 2020-02-28 DIAGNOSIS — S82851D Displaced trimalleolar fracture of right lower leg, subsequent encounter for closed fracture with routine healing: Secondary | ICD-10-CM

## 2020-02-28 NOTE — Progress Notes (Signed)
Office Visit Note   Patient: Alyssa Armstrong           Date of Birth: 12-27-1950           MRN: [id number] Visit Date: 02/28/2020              Requested by: Bernerd Limbo, MD [place],  [place] [id number] PCP: Bernerd Limbo, MD   Assessment & Plan: Visit Diagnoses:  1. Closed trimalleolar fracture of right ankle with routine healing, subsequent encounter     Plan: Patient is happy with results of surgery she is released from care and can follow-up on an as-needed basis.  Follow-Up Instructions: No follow-ups on file.   Orders:  No orders of the defined types were placed in this encounter.  No orders of the defined types were placed in this encounter.     Procedures: No procedures performed   Clinical Data: No additional findings.   Subjective: Chief Complaint  Patient presents with  . Right Ankle - Follow-up    09/06/2019 ORIF right ankle fracture    HPI 70 year old female returns post-ORIF trimalleolar ankle fracture 09/06/2019.  Patient is wearing regular shoes she is amatory without a limp.  She is worked hard on her strengthening and range of motion.  Occasionally at the end of the day she has had slight discomfort and she has treated this with elevation.  Review of Systems all other systems noncontributory to HPI.   Objective: Vital Signs: BP 135/80   Pulse 82   Ht 5\' 4"  (1.626 m)   Wt 194 lb (88 kg)   BMI 33.30 kg/m   Physical Exam Constitutional:      Appearance: She is well-developed.  HENT:     Head: Normocephalic.     Right Ear: External ear normal.     Left Ear: External ear normal.  Eyes:     Pupils: Pupils are equal, round, and reactive to light.  Neck:     Thyroid: No thyromegaly.     Trachea: No tracheal deviation.  Cardiovascular:     Rate and Rhythm: Normal rate.  Pulmonary:     Effort: Pulmonary effort is normal.  Abdominal:     Palpations: Abdomen is soft.  Skin:    General: Skin is warm and  dry.  Neurological:     Mental Status: She is alert and oriented to person, place, and time.  Psychiatric:        Mood and Affect: Mood and affect normal.        Behavior: Behavior normal.     Ortho Exam normal heel toe gait no limping.  Incisions are well-healed. Specialty Comments:  No specialty comments available.  Imaging: No results found.   PMFS History: Patient Active Problem List   Diagnosis Date Noted  . Trimalleolar fracture of ankle, closed 09/07/2019  . Closed right ankle fracture 09/07/2019  . Chronic prescription benzodiazepine use 04/11/2019  . Lymphocytosis 07/08/2018  . Sensorineural hearing loss (SNHL) of both ears 03/20/2016  . Combined hyperlipidemia associated with type 2 diabetes mellitus ([place]) 05/09/2013  . Menopausal hot flushes 05/09/2013  . HTN (hypertension) 09/07/2012  . Dyslipidemia 09/07/2012  . Type 2 diabetes, diet controlled ([place]) 03/03/2012  . GERD (gastroesophageal reflux disease) 04/28/2011  . Seasonal allergies 04/28/2011  . Vitamin D deficiency 04/28/2011  . Major depression, recurrent, chronic ([place]) 06/14/2010  . PES PLANUS 02/28/2008  . Acquired flat foot 02/28/2008   Past Medical History:  Diagnosis  Date  . Bronchitis   . Depression 70YRS AGO  . Diabetes mellitus without complication ([place])   . GERD (gastroesophageal reflux disease)   . Hypertension     Family History  Problem Relation Age of Onset  . Diabetes Mother   . Heart disease Mother   . Diabetes Father   . Cancer Father        LUNG  . Hyperlipidemia Brother     Past Surgical History:  Procedure Laterality Date  . ABDOMINAL HYSTERECTOMY     PARTIAL  . BUNIONECTOMY  2009  . ORIF ANKLE FRACTURE Bilateral 09/07/2019   Procedure: OPEN REDUCTION INTERNAL FIXATION (ORIF), RIGHT MALLEOLAR ANKLE FRACTURE;  Surgeon: Marybelle Killings, MD;  Location: [facility];  Service: Orthopedics;  Laterality: Bilateral;  . ROTATOR CUFF REPAIR  2008  . rotator cuff suregery     Social  History   Occupational History  . Not on file  Tobacco Use  . Smoking status: Never Smoker  . Smokeless tobacco: Never Used  Substance and Sexual Activity  . Alcohol use: Yes  . Drug use: No  . Sexual activity: Yes

## 2020-03-29 ENCOUNTER — Encounter: Payer: Self-pay | Admitting: Gastroenterology

## 2020-04-12 ENCOUNTER — Ambulatory Visit: Payer: Medicare Other | Admitting: Gastroenterology

## 2020-04-12 ENCOUNTER — Encounter: Payer: Self-pay | Admitting: Gastroenterology

## 2020-04-12 ENCOUNTER — Telehealth: Payer: Self-pay

## 2020-04-12 VITALS — BP 120/64 | HR 80 | Ht 64.25 in | Wt 202.4 lb

## 2020-04-12 DIAGNOSIS — R1031 Right lower quadrant pain: Secondary | ICD-10-CM | POA: Diagnosis not present

## 2020-04-12 DIAGNOSIS — K8689 Other specified diseases of pancreas: Secondary | ICD-10-CM | POA: Diagnosis not present

## 2020-04-12 DIAGNOSIS — R933 Abnormal findings on diagnostic imaging of other parts of digestive tract: Secondary | ICD-10-CM | POA: Diagnosis not present

## 2020-04-12 NOTE — Telephone Encounter (Signed)
-----   Message from Yetta Flock, MD sent at 04/12/2020 12:09 PM EST ----- Regarding: colonoscopy recall Jan can you please place recall for this patient colonoscopy 10/2025. Thanks

## 2020-04-12 NOTE — Patient Instructions (Signed)
If you are age 71 or older, your body mass index should be between 23-30. Your Body mass index is 34.47 kg/m. If this is out of the aforementioned range listed, please consider follow up with your Primary Care Provider.  If you are age 27 or younger, your body mass index should be between 19-25. Your Body mass index is 34.47 kg/m. If this is out of the aformentioned range listed, please consider follow up with your Primary Care Provider.   We will request your colonoscopy report from [facility].  We will be in contact with you regarding scheduling you for a EUS with Dr. Ardis Hughs or Dr. Rush Landmark.  Thank you for entrusting me with your care and for choosing [facility], Dr.  Cellar

## 2020-04-12 NOTE — Telephone Encounter (Signed)
Colon Recall placed for 10-2025

## 2020-04-12 NOTE — Progress Notes (Addendum)
Alyssa :  70 year old Armstrong with history of GERD, diabetes, arthritis, referred here by Harrison Mons PA-C for abnormal imaging of the abdomen, pancreatic mass.  Patient presented to her primary care physician in early February with chest tightness.  She underwent initial evaluation with EKG, labs including D-dimer, and ultimately led to a CT angio of the chest rule out PE.  This was done at [facility]  There was no evidence of pulmonary embolism but incidentally there was a 3.5 x 2 cm mass lesion in the pancreatic head with enhancement.  She subsequently was sent for a CT abdomen pelvis with contrast on February 9.  On this exam there was a 2.7 x 2.3 oval mass in the inferior margin of the pancreatic head that was enhancing.  Radiologist reports not typical for traditional adenocarcinoma and consideration for other possibilities such as neuroendocrine tumor or GIST.  There is no evidence of metastatic disease on either of these exams.  The patient states that she has had no epigastric pain at all.  Her appetite can fluctuate, sometimes good, sometimes not so good.  She denies any nausea and vomiting is able to eat okay.  No postprandial abdominal pains.  She does have some baseline reflux for which she takes Pepcid 20 mg a day and states that generally works pretty well.  If she has some breakthrough she will use Carafate as needed and that tends to work okay, she has rarely ever needed to take that.  She denies any dysphagia. Of note she had a CT scan in our system in 2015 which showed her pancreas was normal.   She denies any weight loss.  No family history of pancreatic cancer, colon cancer, or gastric cancer.  Her only complaint is that she has had some right inguinal pain that has been ongoing for a few years at least.  It is there most of the time but can come and go.  Often worse with positional changes and walking can make it worse.  She denies any blood in her stools or bowel habit problems.  She  also had some transient pain in her right to mid back that was bothering her around the time she had her scans done but that has since resolved and gone away.  Her last colonoscopy was 3 years ago at [facility], no records of that available.   Labs reviewed in Care everywhere: 01/10/20 - Hgb 13.7, MCV 78.8, plt 268  Chronic microcytosis Labs historically in 2020 show ferritin 297 to 304, iron 106, iron sat 42%   CT abdomen / pelvis with and without contrast 03/28/20: There is a 2.7 x 2.3 cm oval mass which is contiguous and likely arising from the anterior inferior margin of thepancreatic head. Most of the mass enhances homogeneously, with some mild decreased enhancement along the inferior posterior margin, better seen on the previous CTPA. The mass enhances slightly more than the pancreatic parenchyma on the early arterial phase imaging, also better seen on the CTPA. There is no vascular encasement. Patent splenic superior mesenteric and portal vein.    IMPRESSION:  1. Enhancing oval mass which appears to arise from the pancreas. The appearance is not typical for adenocarcinoma and would consider other possibilities such as neuroendocrine tumor or GIST tumor. No evidence of metastatic disease.   2. Tiny hypodense renal lesions too small to characterize most likely small cysts.     CT angio chest 03/23/20: IMPRESSION:  1. No pulmonary embolism. Evidence of any focal, no  evidence of any focal consolidation or infiltrate in the lungs.  2. 4 mm nodule in the left lower lobe. Please see reformations below.  3. There is a 3.5 x 2 point centimeter masslike lesion in the region of the pancreatic head which appears to be enhancing. Differential includes pancreatic tumor, enlarged lymph node, or other GI tumor. Further evaluation with dedicated CT abdomen pelvis and consultation for potential EUS is recommended.     CT scan 05/07/2013 in [facility] system showed a normal pancreas   Past Medical History:   Diagnosis Date  . Arthritis   . Bronchitis   . Chronic prescription benzodiazepine use   . Combined hyperlipidemia associated with type 2 diabetes mellitus ([place])   . Depression 53YRS AGO  . Diabetes mellitus without complication ([place])   . GERD (gastroesophageal reflux disease)   . Hypertension   . 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 Seasonal allergies   . Sensorineural hearing loss (SNHL) of both ears   . Vitamin D deficiency      Past Surgical History:  Procedure Laterality Date  . ABDOMINAL HYSTERECTOMY     PARTIAL, have ovaries  . BUNIONECTOMY Left 2009  . ORIF ANKLE FRACTURE Bilateral 09/07/2019   Procedure: OPEN REDUCTION INTERNAL FIXATION (ORIF), RIGHT MALLEOLAR ANKLE FRACTURE;  Surgeon: Marybelle Killings, MD;  Location: [facility];  Service: Orthopedics;  Laterality: Bilateral;  . ROTATOR CUFF REPAIR Right 2008   Family History  Problem Relation Age of Onset  . Diabetes Mother   . Heart disease Mother   . Diabetes Father   . Lung cancer Father   . Hyperlipidemia Brother   . Clotting disorder Sister   . Hypertension Sister   . Asthma Daughter    Social History   Tobacco Use  . Smoking status: Former Smoker    Types: Cigarettes    Quit date: 1970    Years since quitting: 52.1  . Smokeless tobacco: Never Used  Vaping Use  . Vaping Use: Never used  Substance Use Topics  . Alcohol use: Yes    Comment: beer or wine occasional  . Drug use: No   Current Outpatient Medications  Medication Sig Dispense Refill  . acetaminophen (TYLENOL) 500 MG tablet Take 500 mg by mouth daily as needed.    Marland Kitchen albuterol (VENTOLIN HFA) 108 (90 Base) MCG/ACT inhaler Inhale 2 puffs into the lungs every 4 (four) hours as needed.    Marland Kitchen atorvastatin (LIPITOR) 40 MG tablet Take 40 mg by mouth daily.    . benzonatate (TESSALON) 100 MG capsule Take 100-200 mg by mouth 3 (three) times daily as needed.    Marland Kitchen buPROPion (WELLBUTRIN XL) 150 MG 24 hr  tablet Take 150 mg by mouth daily.    . calcium-vitamin D (OSCAL WITH D) 500-200 MG-UNIT TABS tablet Take 1 tablet by mouth daily.    . cetirizine (ZYRTEC) 10 MG tablet Take 1 tablet (10 mg total) by mouth daily. (Patient taking differently: Take 10 mg by mouth daily as needed for allergies.) 30 tablet 3  . Cholecalciferol (VITAMIN D3) 25 MCG (1000 UT) CAPS Take 1 capsule by mouth daily.    . clonazePAM (KLONOPIN) 0.5 MG tablet Take 0.25-0.5 mg by mouth 2 (two) times daily as needed for anxiety.    Marland Kitchen ezetimibe (ZETIA) 10 MG tablet Take 10 mg by mouth daily.    . famotidine (PEPCID) 20 MG tablet Take 20 mg by mouth daily.    Marland Kitchen  losartan (COZAAR) 50 MG tablet Take 50 mg by mouth daily.    . polyethylene glycol (MIRALAX / GLYCOLAX) packet Take 17 g by mouth once a week.    . sucralfate (CARAFATE) 1 g tablet Take 1 g by mouth 4 (four) times daily.    Marland Kitchen venlafaxine (EFFEXOR) 75 MG tablet Take 75 mg by mouth 2 (two) times daily.     No current facility-administered medications for this visit.   Allergies  Allergen Reactions  . Vicodin [Hydrocodone-Acetaminophen] Nausea And Vomiting and Other (See Comments)    severe  . Lisinopril Cough     Review of Systems: All systems reviewed and negative except where noted in Alyssa.   Labs reviewed in care everywhere  Physical Exam: BP 120/64 (BP Location: Left Arm, Patient Position: Sitting, Cuff Size: Normal)   Pulse 80   Ht 5' 4.25" (1.632 m) Comment: height measured without shoes  Wt 202 lb 6 oz (91.8 kg)   BMI 34.47 kg/m  Constitutional: Pleasant,well-developed, Armstrong in no acute distress. HEENT: Normocephalic and atraumatic. Conjunctivae are normal. No scleral icterus. Neck supple.  Cardiovascular: Normal rate, regular rhythm.  Pulmonary/chest: Effort normal and breath sounds normal. No wheezing, rales or rhonchi. Abdominal: Soft, nondistended, nontender.  There are no masses palpable. No hepatomegaly.  Extremities: no  edema Lymphadenopathy: No cervical adenopathy noted. Neurological: Alert and oriented to person place and time. Skin: Skin is warm and dry. No rashes noted. Psychiatric: Normal mood and affect. Behavior is normal.   ASSESSMENT AND PLAN: 70 year old Armstrong here for new patient assessment of the following:  Pancreatic mass Abnormal imaging pancreas R inguinal pain  As above, patient initially underwent a CT angio to rule out PE when she was experiencing some chest tightness, this incidentally noted a pancreatic lesion and was further evaluated with a CT abdomen pelvis as above.  She does have a pancreatic head lesion, I reviewed differential diagnosis for her.  Radiologist thinks this is atypical for traditional adenocarcinoma but this warrants further evaluation to ensure that.  I discussed options to evaluate this which include EUS with possible FNA of this lesion versus MRCP.  I am going to discuss her case with my advanced endoscopy colleagues about pursuing EUS for this.  I discussed EUS with her, what it entails, risk benefits of the procedure and that of anesthesia, she is agreeable to proceed if needed.  Once I hear back from my advanced endoscopy colleagues I will relay their thoughts and recommendations.  Otherwise, I think her right inguinal pain may be musculoskeletal in etiology, perhaps related to her right hip.  I recommend she follow with her primary care for that particular issue based on how she describes it today, I do not see any clear cause for that on CT scan.  I will reach out to the office that performed her last colonoscopy to clarify findings and when she is next due for that.  She agreed with the plan, all questions answered.  Get back to her wants to speak with my colleagues about possible EUS.  Scotland Cellar, MD [facility]  CC: Bernerd Limbo, MD Harrison Mons PA-C      ADDENDUM: Colonoscopy report arrived - done 10/25/2015 - Dr. Lucienne Capers  - good prep, normal ileum and colon - repeat colonoscopy in 10 years for screening purposes, will place recall.

## 2020-04-13 ENCOUNTER — Telehealth: Payer: Self-pay

## 2020-04-13 ENCOUNTER — Telehealth: Payer: Self-pay | Admitting: Gastroenterology

## 2020-04-13 NOTE — Telephone Encounter (Signed)
I discussed her case with my advanced endoscopy colleagues who agreed that EUS is the next step in evaluating her pancreatic lesion. I discussed what this procedure entailed with the patient during our clinic visit and she wanted to proceed.   Alyssa Armstrong can you please help schedule next available EUS with GM or DJ? Thank you

## 2020-04-13 NOTE — Telephone Encounter (Signed)
-----   Message from Irving Copas., MD sent at 04/13/2020  6:00 AM EST ----- Regarding: RE: possible EUS Richardson Landry, Reasonable to have a further evaluation of this with EGD/EUS. I looked at a 2015 CT scan and there isn't anything noted, but there does appear to be some lesion in that particular region that is more lucent, though I could be overreading. Khalil Belote, please get the imaging from [facility] and get uploaded as soon as able. Please then schedule EGD/EUS with DJ or myself next available. Suspect with her symptoms that this turns out to be a GIST. GM ----- Message ----- From: Yetta Flock, MD Sent: 04/12/2020  11:52 AM EST To: Milus Banister, MD, # Subject: possible EUS                                   Hey guys, I saw this lady today, referred from [facility]. She had an incidentally noted pancreatic lesion on CT chest during workup for another issue. CT abdomen shows 2.7cm pancreatic head mass with enhancement. Radiologist there thinks perhaps less likely adeno ca and raises question of neuroendocrine tumor. Thinking this probably warrants EUS with FNA if you agree? Thanks.  Richardson Landry

## 2020-04-16 ENCOUNTER — Other Ambulatory Visit: Payer: Self-pay

## 2020-04-16 DIAGNOSIS — K8689 Other specified diseases of pancreas: Secondary | ICD-10-CM

## 2020-04-16 DIAGNOSIS — R933 Abnormal findings on diagnostic imaging of other parts of digestive tract: Secondary | ICD-10-CM

## 2020-04-16 NOTE — Telephone Encounter (Signed)
EUS scheduled, pt instructed and medications reviewed.  Patient instructions mailed to home.  Patient to call with any questions or concerns.  

## 2020-04-16 NOTE — Telephone Encounter (Signed)
EGD EUS scheduled for 05/17/20 at 1030 am with Dr Rush Landmark at [place] test on 05/14/20 at 1035 am

## 2020-05-14 ENCOUNTER — Other Ambulatory Visit (HOSPITAL_COMMUNITY)
Admission: RE | Admit: 2020-05-14 | Discharge: 2020-05-14 | Disposition: A | Discharge status: Home / Self Care | Payer: Medicare Other | Source: Ambulatory Visit | Attending: Gastroenterology | Admitting: Gastroenterology

## 2020-05-14 DIAGNOSIS — Z20822 Contact with and (suspected) exposure to covid-19: Secondary | ICD-10-CM | POA: Diagnosis not present

## 2020-05-14 DIAGNOSIS — Z01812 Encounter for preprocedural laboratory examination: Secondary | ICD-10-CM | POA: Diagnosis present

## 2020-05-14 LAB — SARS CORONAVIRUS 2 (TAT 6-24 HRS): SARS Coronavirus 2: NEGATIVE

## 2020-05-16 ENCOUNTER — Encounter (HOSPITAL_COMMUNITY): Payer: Self-pay | Admitting: Gastroenterology

## 2020-05-16 ENCOUNTER — Other Ambulatory Visit: Payer: Self-pay

## 2020-05-16 NOTE — Progress Notes (Signed)
PCP - Dr. Coletta Memos  Cardiologist -   Chest x-ray -  EKG - 09/07/19 Stress Test -  ECHO -  Cardiac Cath -   Sleep Study -  CPAP -   Fasting Blood Sugar:  129s Checks Blood Sugar:  1x/wk  Blood Thinner Instructions:  Aspirin Instructions:   ERAS Protcol -   COVID TEST- 05/14/20  Anesthesia review: n/a  -------------  SDW INSTRUCTIONS:  Your procedure is scheduled on 05/17/20. Please report to [facility] Main Entrance "A" at [place].M., and check in at the Admitting office. Call this number if you have problems the morning of surgery: [phone]   Remember: Do not eat or drink after midnight the night before your surgery  Medications to take morning of surgery with a sip of water include: acetaminophen (TYLENOL) if needed albuterol (VENTOLIN HFA)  atorvastatin (LIPITOR)  buPROPion (WELLBUTRIN XL) cetirizine (ZYRTEC)  clonazePAM (KLONOPIN) if needed famotidine (PEPCID)  sucralfate (CARAFATE)  venlafaxine (EFFEXOR)  As of today, STOP taking any Aspirin (unless otherwise instructed by your surgeon), Aleve, Naproxen, Ibuprofen, Motrin, Advil, Goody's, BC's, all herbal medications, fish oil, and all vitamins.    The Morning of Surgery Do not wear jewelry, make-up or nail polish. Do not wear lotions, powders, or perfumes, or deodorant Do not shave 48 hours prior to surgery.   Do not bring valuables to the hospital. [facility] is not responsible for any belongings or valuables. If you are a smoker, DO NOT Smoke 24 hours prior to surgery If you wear a CPAP at night please bring your mask the morning of surgery  Remember that you must have someone to transport you home after your surgery, and remain with you for 24 hours if you are discharged the same day. Please bring cases for contacts, glasses, hearing aids, dentures or bridgework because it cannot be worn into surgery.   Patients discharged the day of surgery will not be allowed to drive home.   Please shower the  NIGHT BEFORE SURGERY and the MORNING OF SURGERY with DIAL Soap. Wear comfortable clothes the morning of surgery. Oral Hygiene is also important to reduce your risk of infection.  Remember - BRUSH YOUR TEETH THE MORNING OF SURGERY WITH YOUR REGULAR TOOTHPASTE  Patient denies shortness of breath, fever, cough and chest pain.

## 2020-05-17 ENCOUNTER — Ambulatory Visit (HOSPITAL_COMMUNITY): Payer: Medicare Other | Admitting: Anesthesiology

## 2020-05-17 ENCOUNTER — Encounter (HOSPITAL_COMMUNITY): Payer: Self-pay | Admitting: Gastroenterology

## 2020-05-17 ENCOUNTER — Ambulatory Visit (HOSPITAL_COMMUNITY)
Admission: RE | Admit: 2020-05-17 | Discharge: 2020-05-17 | Disposition: A | Discharge status: Home / Self Care | Payer: Medicare Other | Attending: Gastroenterology | Admitting: Gastroenterology

## 2020-05-17 ENCOUNTER — Encounter (HOSPITAL_COMMUNITY)
Admission: RE | Disposition: A | Discharge status: Home / Self Care | Payer: Self-pay | Source: Home / Self Care | Attending: Gastroenterology

## 2020-05-17 DIAGNOSIS — Z87891 Personal history of nicotine dependence: Secondary | ICD-10-CM | POA: Insufficient documentation

## 2020-05-17 DIAGNOSIS — K2970 Gastritis, unspecified, without bleeding: Secondary | ICD-10-CM | POA: Diagnosis not present

## 2020-05-17 DIAGNOSIS — Z825 Family history of asthma and other chronic lower respiratory diseases: Secondary | ICD-10-CM | POA: Diagnosis not present

## 2020-05-17 DIAGNOSIS — Z888 Allergy status to other drugs, medicaments and biological substances status: Secondary | ICD-10-CM | POA: Insufficient documentation

## 2020-05-17 DIAGNOSIS — K317 Polyp of stomach and duodenum: Secondary | ICD-10-CM | POA: Diagnosis not present

## 2020-05-17 DIAGNOSIS — K2289 Other specified disease of esophagus: Secondary | ICD-10-CM | POA: Insufficient documentation

## 2020-05-17 DIAGNOSIS — Z8249 Family history of ischemic heart disease and other diseases of the circulatory system: Secondary | ICD-10-CM | POA: Diagnosis not present

## 2020-05-17 DIAGNOSIS — Z885 Allergy status to narcotic agent status: Secondary | ICD-10-CM | POA: Diagnosis not present

## 2020-05-17 DIAGNOSIS — Z801 Family history of malignant neoplasm of trachea, bronchus and lung: Secondary | ICD-10-CM | POA: Insufficient documentation

## 2020-05-17 DIAGNOSIS — Z833 Family history of diabetes mellitus: Secondary | ICD-10-CM | POA: Insufficient documentation

## 2020-05-17 DIAGNOSIS — D490 Neoplasm of unspecified behavior of digestive system: Secondary | ICD-10-CM | POA: Insufficient documentation

## 2020-05-17 DIAGNOSIS — K2950 Unspecified chronic gastritis without bleeding: Secondary | ICD-10-CM | POA: Diagnosis not present

## 2020-05-17 DIAGNOSIS — K8689 Other specified diseases of pancreas: Secondary | ICD-10-CM

## 2020-05-17 DIAGNOSIS — R933 Abnormal findings on diagnostic imaging of other parts of digestive tract: Secondary | ICD-10-CM

## 2020-05-17 DIAGNOSIS — Z832 Family history of diseases of the blood and blood-forming organs and certain disorders involving the immune mechanism: Secondary | ICD-10-CM | POA: Insufficient documentation

## 2020-05-17 DIAGNOSIS — K449 Diaphragmatic hernia without obstruction or gangrene: Secondary | ICD-10-CM | POA: Diagnosis not present

## 2020-05-17 DIAGNOSIS — Z8349 Family history of other endocrine, nutritional and metabolic diseases: Secondary | ICD-10-CM | POA: Diagnosis not present

## 2020-05-17 HISTORY — PX: BIOPSY: SHX5522

## 2020-05-17 HISTORY — PX: EUS: SHX5427

## 2020-05-17 HISTORY — PX: ESOPHAGOGASTRODUODENOSCOPY (EGD) WITH PROPOFOL: SHX5813

## 2020-05-17 HISTORY — PX: FINE NEEDLE ASPIRATION: SHX5430

## 2020-05-17 SURGERY — ESOPHAGOGASTRODUODENOSCOPY (EGD) WITH PROPOFOL
Anesthesia: Monitor Anesthesia Care

## 2020-05-17 MED ORDER — LIDOCAINE 2% (20 MG/ML) 5 ML SYRINGE
INTRAMUSCULAR | Status: DC | PRN
  Administered 2020-05-17: 100 mg via INTRAVENOUS

## 2020-05-17 MED ORDER — PROPOFOL 500 MG/50ML IV EMUL
INTRAVENOUS | Status: DC | PRN
  Administered 2020-05-17: 150 ug/kg/min via INTRAVENOUS

## 2020-05-17 MED ORDER — PHENYLEPHRINE HCL (PRESSORS) 10 MG/ML IV SOLN
INTRAVENOUS | Status: DC | PRN
  Administered 2020-05-17: 40 ug via INTRAVENOUS

## 2020-05-17 MED ORDER — PHENYLEPHRINE HCL-NACL 10-0.9 MG/250ML-% IV SOLN
INTRAVENOUS | Status: DC | PRN
  Administered 2020-05-17: 25 ug/min via INTRAVENOUS

## 2020-05-17 MED ORDER — SODIUM CHLORIDE 0.9 % IV SOLN: INTRAVENOUS | Status: DC

## 2020-05-17 MED ORDER — LACTATED RINGERS IV SOLN
INTRAVENOUS | Status: DC
  Administered 2020-05-17: 1000 mL via INTRAVENOUS

## 2020-05-17 MED ORDER — PROPOFOL 10 MG/ML IV BOLUS
INTRAVENOUS | Status: DC | PRN
  Administered 2020-05-17: 50 mg via INTRAVENOUS

## 2020-05-17 SURGICAL SUPPLY — 15 items

## 2020-05-17 NOTE — Op Note (Signed)
[facility] Patient Name: Alyssa Armstrong Procedure Date : 05/17/2020 MRN: [id number] Attending MD: Justice Britain , MD Date of Birth: 1951-01-23 CSN: [id number] Age: 70 Admit Type: Outpatient Procedure:                Upper EUS Indications:              Suspected mass in pancreas on CT scan Providers:                Justice Britain, MD, Kary Kos RN, RN, Cletis Athens, Technician Referring MD:             Carlota Raspberry. Havery Moros, MD, Bernerd Limbo Medicines:                Monitored Anesthesia Care Complications:            No immediate complications. Estimated Blood Loss:     Estimated blood loss was minimal. Procedure:                Pre-Anesthesia Assessment:                           - Prior to the procedure, a History and Physical                            was performed, and patient medications and                            allergies were reviewed. The patient's tolerance of                            previous anesthesia was also reviewed. The risks                            and benefits of the procedure and the sedation                            options and risks were discussed with the patient.                            All questions were answered, and informed consent                            was obtained. Prior Anticoagulants: The patient has                            taken no previous anticoagulant or antiplatelet                            agents. ASA Grade Assessment: III - A patient with                            severe systemic disease. After reviewing the risks  and benefits, the patient was deemed in                            satisfactory condition to undergo the procedure.                           After obtaining informed consent, the endoscope was                            passed under direct vision. Throughout the                            procedure, the patient's blood pressure,  pulse, and                            oxygen saturations were monitored continuously. The                            [id number]-H190 ([id number]) Olympus gastroscope was                            introduced through the mouth, and advanced to the                            second part of duodenum. The [id number]-[id number] ([id number])                            Olympus duodenoscope was introduced through the                            mouth, and advanced to the second part of duodenum.                            The [id number] ([id number]) Olympus Linear EUS was                            introduced through the mouth, and advanced to the                            duodenum for ultrasound examination from the                            stomach and duodenum. The upper EUS was                            accomplished without difficulty. The patient                            tolerated the procedure. Scope In: Scope Out: Findings:      ENDOSCOPIC FINDING: :      No gross lesions were noted in the entire esophagus.      The Z-line was irregular and was found 35 cm from the incisors.      A 3 cm hiatal hernia was present.  Patchy mild inflammation characterized by erosions, erythema, friability       and granularity was found in the gastric body and in the gastric antrum.       Biopsies were taken with a cold forceps for histology and Helicobacter       pylori testing.      No gross lesions were noted in the duodenal bulb, in the first portion       of the duodenum and in the second portion of the duodenum.      The major papilla was normal.      ENDOSONOGRAPHIC FINDING: :      A round mass was identified in the region of the head/uncinate process       of the pancreas. The mass was isoechoic. The mass measured 28 mm by 28       mm in maximal cross-sectional diameter. The endosonographic borders were       well-defined. There was sonographic evidence suggesting invasion of a       tributary off of the superior  mesenteric artery, though the superior       mesenteric artery itself was felt to be intact and not adjacent to this.       An intact interface was seen between the mass and the celiac trunk       suggesting a lack of invasion. The remainder of the pancreas was       examined. The endosonographic appearance of parenchyma and the upstream       pancreatic duct indicated a normal appearing duct (PDH - 2.2 mm, PDN -       1.6 mm, PDB - 1.6 mm, PDT - 1.1 mm), no ductal or parenchymal       calcifications and no parenchymal atrophy. Fine needle biopsy was       performed of the mass. Color Doppler imaging was utilized prior to       needle puncture to confirm a lack of significant vascular structures       within the needle path. Six passes were made with the Acquire 25 gauge       ultrasound core biopsy needle using a transduodenal approach. Visible       cores of tissue were obtained. Preliminary cytologic examination and       touch preps were performed. Final cytology results are pending.      There was no sign of significant endosonographic abnormality in the       common bile duct (3.3 mm) and in the common hepatic duct (4.6 mm). No       stones, no biliary sludge and ducts of normal caliber were identified.      Endosonographic imaging in the visualized portion of the liver showed no       mass.      The celiac region was visualized. Impression:               EGD Impression:                           - No gross lesions in esophagus. Z-line irregular,                            35 cm from the incisors.                           -  Hiatal hernia noted.                           - Gastritis. Biopsied.                           - No gross lesions in the duodenal bulb, in the                            first portion of the duodenum and in the second                            portion of the duodenum.                           - Normal major papilla.                           EUS  Impression:                           - A mass was identified in the region of the                            pancreatic head/uncinate process. Cytology results                            are pending. However, the endosonographic                            appearance is suspicious for a neuroendocrine                            tumor. This was staged T2 N0 Mx by endosonographic                            criteria. The staging applies if malignancy is                            confirmed. Fine needle biopsy performed.                           - There was no sign of significant pathology in the                            common bile duct and in the common hepatic duct. Recommendation:           - The patient will be observed post-procedure,                            until all discharge criteria are met.                           - Discharge patient to home.                           -  Patient has a contact number available for                            emergencies. The signs and symptoms of potential                            delayed complications were discussed with the                            patient. Return to normal activities tomorrow.                            Written discharge instructions were provided to the                            patient.                           - Observe patient's clinical course.                           - Await cytology results and await path results.                           - If malignancy is confirmed then will need updated                            CT-CAP to be performed as well as drawing of HFP                            and CA19-9.                           - Continue present medications otherwise.                           - The findings and recommendations were discussed                            with the patient.                           - The findings and recommendations were discussed                            with the  responsible adult. Procedure Code(s):        --- Professional ---                           [phone], Esophagogastroduodenoscopy, flexible,                            transoral; with transendoscopic ultrasound-guided                            intramural or transmural fine needle  aspiration/biopsy(s), (includes endoscopic                            ultrasound examination limited to the esophagus,                            stomach or duodenum, and adjacent structures) Diagnosis Code(s):        --- Professional ---                           K22.8, Other specified diseases of esophagus                           K29.70, Gastritis, unspecified, without bleeding                           K86.89, Other specified diseases of pancreas                           R93.3, Abnormal findings on diagnostic imaging of                            other parts of digestive tract CPT copyright 2019 American Medical Association. All rights reserved. The codes documented in this report are preliminary and upon coder review may  be revised to meet current compliance requirements. Justice Britain, MD 05/17/2020 12:18:36 PM Number of Addenda: 0

## 2020-05-17 NOTE — Anesthesia Preprocedure Evaluation (Signed)
Anesthesia Evaluation  Patient identified by MRN, date of birth, ID band Patient awake    Reviewed: Allergy & Precautions, NPO status , Patient's Chart, lab work & pertinent test results  Airway Mallampati: II  TM Distance: >3 FB Neck ROM: Full    Dental no notable dental hx. (+) Teeth Intact   Pulmonary former smoker,    Pulmonary exam normal breath sounds clear to auscultation       Cardiovascular hypertension, Pt. on medications Normal cardiovascular exam Rhythm:Regular Rate:Normal  EKG 09/07/19 NSR, Normal   Neuro/Psych PSYCHIATRIC DISORDERS Depression negative neurological ROS     GI/Hepatic Neg liver ROS, GERD  Medicated,Pancreatic lesion   Endo/Other  diabetes, Well Controlled, Type 2Hyperlipidemia Obesity  Renal/GU negative Renal ROS  negative genitourinary   Musculoskeletal  (+) Arthritis , Osteoarthritis,    Abdominal (+) + obese,   Peds  Hematology negative hematology ROS (+)   Anesthesia Other Findings   Reproductive/Obstetrics                             Anesthesia Physical Anesthesia Plan  ASA: II  Anesthesia Plan: MAC   Post-op Pain Management:    Induction: Intravenous  PONV Risk Score and Plan: 2 and Propofol infusion and Treatment may vary due to age or medical condition  Airway Management Planned: Natural Airway and Nasal Cannula  Additional Equipment:   Intra-op Plan:   Post-operative Plan:   Informed Consent: I have reviewed the patients History and Physical, chart, labs and discussed the procedure including the risks, benefits and alternatives for the proposed anesthesia with the patient or authorized representative who has indicated his/her understanding and acceptance.     Dental advisory given  Plan Discussed with: CRNA and Anesthesiologist  Anesthesia Plan Comments:         Anesthesia Quick Evaluation

## 2020-05-17 NOTE — Transfer of Care (Signed)
Immediate Anesthesia Transfer of Care Note  Patient: Linsie Lupo  Procedure(s) Performed: ESOPHAGOGASTRODUODENOSCOPY (EGD) WITH PROPOFOL (N/A ) UPPER ENDOSCOPIC ULTRASOUND (EUS) RADIAL (N/A ) FINE NEEDLE ASPIRATION (FNA) LINEAR BIOPSY  Patient Location: Endoscopy Unit  Anesthesia Type:MAC  Level of Consciousness: awake, alert  and oriented  Airway & Oxygen Therapy: Patient Spontanous Breathing  Post-op Assessment: Report given to RN, Post -op Vital signs reviewed and stable and Patient moving all extremities X 4  Post vital signs: Reviewed and stable  Last Vitals:  Vitals Value Taken Time  BP 104/35 05/17/20 1158  Temp    Pulse 73 05/17/20 1200  Resp 18 05/17/20 1200  SpO2 98 % 05/17/20 1200  Vitals shown include unvalidated device data.  Last Pain:  Vitals:   05/17/20 1158  TempSrc:   PainSc: 0-No pain         Complications: No complications documented.

## 2020-05-17 NOTE — Anesthesia Postprocedure Evaluation (Signed)
Anesthesia Post Note  Patient: [name]  Procedure(s) Performed: ESOPHAGOGASTRODUODENOSCOPY (EGD) WITH PROPOFOL (N/A ) UPPER ENDOSCOPIC ULTRASOUND (EUS) RADIAL (N/A ) FINE NEEDLE ASPIRATION (FNA) LINEAR BIOPSY     Patient location during evaluation: PACU Anesthesia Type: MAC Level of consciousness: awake and alert and oriented Pain management: pain level controlled Vital Signs Assessment: post-procedure vital signs reviewed and stable Respiratory status: spontaneous breathing, nonlabored ventilation and respiratory function stable Cardiovascular status: stable and blood pressure returned to baseline Postop Assessment: no apparent nausea or vomiting Anesthetic complications: no   No complications documented.  Last Vitals:  Vitals:   05/17/20 0958 05/17/20 1158  BP: 137/67 (!) 104/35  Pulse: 85 81  Resp: 20 14  Temp: 36.5 C   SpO2: 97% 98%    Last Pain:  Vitals:   05/17/20 1158  TempSrc:   PainSc: 0-No pain                 Timohty Renbarger A.

## 2020-05-17 NOTE — Discharge Instructions (Signed)

## 2020-05-17 NOTE — H&P (Signed)
GASTROENTEROLOGY PROCEDURE H&P NOTE   Primary Care Physician: Bernerd Limbo, MD  HPI: Demmi Sindt is a 70 y.o. female who presents for EGD/EUS for evaluation of abnormal CT finding with concern for Pancreatic lesion v GIST.  Past Medical History:  Diagnosis Date  . Arthritis   . Bronchitis   . Chronic prescription benzodiazepine use   . Combined hyperlipidemia associated with type 2 diabetes mellitus ([place])   . Depression 73YRS AGO  . Diabetes mellitus without complication ([place])   . GERD (gastroesophageal reflux disease)   . Hypertension   . 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 Seasonal allergies   . Sensorineural hearing loss (SNHL) of both ears   . Vitamin D deficiency    Past Surgical History:  Procedure Laterality Date  . ABDOMINAL HYSTERECTOMY     PARTIAL, have ovaries  . BUNIONECTOMY Left 2009  . ORIF ANKLE FRACTURE Bilateral 09/07/2019   Procedure: OPEN REDUCTION INTERNAL FIXATION (ORIF), RIGHT MALLEOLAR ANKLE FRACTURE;  Surgeon: Marybelle Killings, MD;  Location: [facility] ORS;  Service: Orthopedics;  Laterality: Bilateral;  . ROTATOR CUFF REPAIR Right 2008   No current facility-administered medications for this encounter.   Allergies  Allergen Reactions  . Vicodin [Hydrocodone-Acetaminophen] Nausea And Vomiting and Other (See Comments)    severe  . Lisinopril Cough   Family History  Problem Relation Age of Onset  . Diabetes Mother   . Heart disease Mother   . Diabetes Father   . Lung cancer Father   . Hyperlipidemia Brother   . Clotting disorder Sister   . Hypertension Sister   . Asthma Daughter    Social History   Socioeconomic History  . Marital status: Significant Other    Spouse name: Not on file  . Number of children: 3  . Years of education: Not on file  . Highest education level: Not on file  Occupational History  . Not on file  Tobacco Use  . Smoking status: Former Smoker    Types:  Cigarettes    Quit date: 1970    Years since quitting: 52.2  . Smokeless tobacco: Never Used  Vaping Use  . Vaping Use: Never used  Substance and Sexual Activity  . Alcohol use: Not Currently    Comment: beer or wine occasional  . Drug use: No  . Sexual activity: Yes  Other Topics Concern  . Not on file  Social History Narrative  . Not on file   Social Determinants of Health   Financial Resource Strain: Not on file  Food Insecurity: Not on file  Transportation Needs: Not on file  Physical Activity: Not on file  Stress: Not on file  Social Connections: Not on file  Intimate Partner Violence: Not on file    Physical Exam: Vital signs in last 24 hours: Weight:  [92.5 kg] 92.5 kg (03/30 1829)   GEN: NAD EYE: Sclerae anicteric ENT: MMM CV: Non-tachycardic GI: Soft, NT/ND NEURO:  Alert & Oriented x 3  Lab Results: No results for input(s): WBC, HGB, HCT, PLT in the last 72 hours. BMET No results for input(s): NA, K, CL, CO2, GLUCOSE, BUN, CREATININE, CALCIUM in the last 72 hours. LFT No results for input(s): PROT, ALBUMIN, AST, ALT, ALKPHOS, BILITOT, BILIDIR, IBILI in the last 72 hours. PT/INR No results for input(s): LABPROT, INR in the last 72 hours.   Impression / Plan: This is a 70 y.o.female who presents for EGD/EUS  for evaluation of abnormal CT finding with concern for Pancreatic lesion v GIST.  The risks of an EUS including intestinal perforation, bleeding, infection, aspiration, and medication effects were discussed as was the possibility it may not give a definitive diagnosis if a biopsy is performed.  When a biopsy of the pancreas is done as part of the EUS, there is an additional risk of pancreatitis at the rate of about 1-2%.  It was explained that procedure related pancreatitis is typically mild, although it can be severe and even life threatening, which is why we do not perform random pancreatic biopsies and only biopsy a lesion/area we feel is concerning  enough to warrant the risk.  The risks and benefits of endoscopic evaluation were discussed with the patient; these include but are not limited to the risk of perforation, infection, bleeding, missed lesions, lack of diagnosis, severe illness requiring hospitalization, as well as anesthesia and sedation related illnesses.  The patient is agreeable to proceed.    Justice Britain, MD [facility] Advanced Endoscopy Office # [phone]

## 2020-05-18 ENCOUNTER — Encounter (HOSPITAL_COMMUNITY): Payer: Self-pay | Admitting: Gastroenterology

## 2020-05-21 ENCOUNTER — Encounter: Payer: Self-pay | Admitting: Gastroenterology

## 2020-05-21 LAB — SURGICAL PATHOLOGY

## 2020-05-22 ENCOUNTER — Other Ambulatory Visit: Payer: Self-pay

## 2020-05-22 ENCOUNTER — Other Ambulatory Visit: Payer: Medicare Other

## 2020-05-22 DIAGNOSIS — K8689 Other specified diseases of pancreas: Secondary | ICD-10-CM

## 2020-05-22 DIAGNOSIS — D3A8 Other benign neuroendocrine tumors: Secondary | ICD-10-CM

## 2020-05-22 LAB — CYTOLOGY - NON PAP

## 2020-05-22 NOTE — Progress Notes (Signed)
Spoke with patient regarding referral we received from Dr. Rush Landmark.  I have offered her an appointment this Monday 05/28/2020 at 11:00 with Cira Rue NP & Dr. Truitt Merle.  She has agreed to this time.  I explained she would need to arrive by 10:45 to check in and that she can bring one person with her to the consult.  She is aware valet parking is a free service available to her.  She was given our location.   She verbalized an understanding of all of the above.

## 2020-05-24 ENCOUNTER — Other Ambulatory Visit: Payer: Medicare Other

## 2020-05-24 DIAGNOSIS — D3A8 Other benign neuroendocrine tumors: Secondary | ICD-10-CM

## 2020-05-24 DIAGNOSIS — K8689 Other specified diseases of pancreas: Secondary | ICD-10-CM

## 2020-05-25 ENCOUNTER — Ambulatory Visit
Admission: RE | Admit: 2020-05-25 | Discharge: 2020-05-25 | Disposition: A | Discharge status: Home / Self Care | Payer: Self-pay | Source: Ambulatory Visit | Attending: Hematology | Admitting: Hematology

## 2020-05-25 ENCOUNTER — Other Ambulatory Visit: Payer: Self-pay

## 2020-05-25 DIAGNOSIS — K8689 Other specified diseases of pancreas: Secondary | ICD-10-CM

## 2020-05-25 LAB — CANCER ANTIGEN 19-9: CA 19-9: 6 U/mL (ref <34)

## 2020-05-25 NOTE — Progress Notes (Signed)
Requested CT abd/pelvis images done at [facility] on 03/28/2020 be pushed to [employer], outside order entered and [employer] was contacted.

## 2020-05-27 NOTE — Progress Notes (Signed)
[place]  [phone] Fax:([phone]) [place] Note   Patient Care Team: Bernerd Limbo, MD as PCP - General (Family Medicine) Jonnie Finner, RN as Oncology Nurse Navigator Truitt Merle, MD as Consulting Physician (Oncology) Alla Feeling, NP as Nurse Practitioner (Nurse Practitioner) 05/28/2020  CHIEF COMPLAINTS/PURPOSE OF CONSULTATION:  Neuroendocrine tumor of the pancreas  HISTORY OF PRESENTING ILLNESS:  Alyssa Armstrong 70 y.o. female with PMH of DM type II, HTN, HL, and depression is here because of newly diagnosed neuroendocrine tumor of the pancreas.  She had her annual wellness visit with PCP Ala Bent, PA on 01/10/2020.  He then had a telemedicine visit on 03/22/2020 for chest tightness/congestion. EKG showed NSR without ischemia. Influenza and Covid-19 were negative.  Due to a slightly elevated D-dimer at 0.72 she underwent CTA on 03/23/2020 that was negative for PE but showed an incidental 4 mm left lower lobe nodule as well as a 3.5 x 2 cm masslike lesion in the region of the pancreatic head.  Subsequent CT AP on 03/28/2020 showed an enhancing oval mass arising from the pancreas felt to represent a neuroendocrine tumor or GIST.  No evidence of metastatic disease in the abdomen or pelvis. She was referred to GI and underwent EUS by Dr. Rush Landmark on 05/17/2020.  EGD showed hiatal hernia and gastritis.  EUS showed a round mass in the region of the head/uncinate process of the pancreas measuring 28 mm x 28 mm with well-defined borders.  There was sonographic evidence suggesting invasion of the tributary off the SMA though the SMA itself was felt to be intact.  FNA path showed neoplastic cells with neuroendocrine differentiation by IHC and Ki67 index less than 3%, a grade 1 well-differentiated neuroendocrine tumor was favored but solid pseudopapillary neoplasm could not be entirely excluded.  This was staged T2N0 by endosonographic criteria.   CA 19-9 is 6 on 05/24/2020, chromogranin A is pending.  Socially, she lives with a significant other, she has 3 children, one deceased son from a motorcycle crash, her living daughter and son are healthy.  She is the oldest sister of 33 total siblings.  She is retired from production work, independent with ADLs and takes breaks, she is active and independent.  She rarely drinks alcohol (beer), denies tobacco or other drug use.  She is up-to-date on other age-appropriate cancer screenings.  Her father had sarcoidosis, no known family history of cancer.    Today she presents with her sister.  He has some fatigue but remains functional and active at home.  Able to do housework, run errands, and ambulate outside.  She has low appetite with 3 pounds weight loss in a month.  For past 1-2 months she has intermittent lower abdominal cramping which is alleviated by rest and massage.  Denies diarrhea, flushing, nausea/vomiting, jaundice.  Her depression has increased due to worry over the new diagnosis.  She has strong family support.  Controlled with medication.  Denies SI.  All other systems were reviewed with the patient and are negative.  MEDICAL HISTORY:  Past Medical History:  Diagnosis Date  . Arthritis   . Bronchitis   . Chronic prescription benzodiazepine use   . Combined hyperlipidemia associated with type 2 diabetes mellitus ([place])   . Depression 26YRS AGO  . Diabetes mellitus without complication ([place])   . GERD (gastroesophageal reflux disease)   . Hypertension   . Lymphocytosis    present since at least 2012, Assoc with mild  red cell microcytosis  . Seasonal allergies   . Sensorineural hearing loss (SNHL) of both ears   . Vitamin D deficiency     SURGICAL HISTORY: Past Surgical History:  Procedure Laterality Date  . ABDOMINAL HYSTERECTOMY     PARTIAL, have ovaries  . BIOPSY  05/17/2020   Procedure: BIOPSY;  Surgeon: Rush Landmark Telford Nab., MD;  Location: [facility];  Service:  Gastroenterology;;  . Lillard Anes Left 2009  . ESOPHAGOGASTRODUODENOSCOPY (EGD) WITH PROPOFOL N/A 05/17/2020   Procedure: ESOPHAGOGASTRODUODENOSCOPY (EGD) WITH PROPOFOL;  Surgeon: Rush Landmark Telford Nab., MD;  Location: [facility];  Service: Gastroenterology;  Laterality: N/A;  . EUS N/A 05/17/2020   Procedure: UPPER ENDOSCOPIC ULTRASOUND (EUS) RADIAL;  Surgeon: Rush Landmark Telford Nab., MD;  Location: [facility];  Service: Gastroenterology;  Laterality: N/A;  . FINE NEEDLE ASPIRATION  05/17/2020   Procedure: FINE NEEDLE ASPIRATION (FNA) LINEAR;  Surgeon: Irving Copas., MD;  Location: [facility];  Service: Gastroenterology;;  . ORIF ANKLE FRACTURE Bilateral 09/07/2019   Procedure: OPEN REDUCTION INTERNAL FIXATION (ORIF), RIGHT MALLEOLAR ANKLE FRACTURE;  Surgeon: Marybelle Killings, MD;  Location: [facility];  Service: Orthopedics;  Laterality: Bilateral;  . ROTATOR CUFF REPAIR Right 2008    SOCIAL HISTORY: Social History   Socioeconomic History  . Marital status: Significant Other    Spouse name: Not on file  . Number of children: 3  . Years of education: Not on file  . Highest education level: Not on file  Occupational History  . Not on file  Tobacco Use  . Smoking status: Former Smoker    Types: Cigarettes    Quit date: 1970    Years since quitting: 52.3  . Smokeless tobacco: Never Used  Vaping Use  . Vaping Use: Never used  Substance and Sexual Activity  . Alcohol use: Not Currently    Comment: beer or wine occasional  . Drug use: No  . Sexual activity: Yes  Other Topics Concern  . Not on file  Social History Narrative  . Not on file   Social Determinants of Health   Financial Resource Strain: Not on file  Food Insecurity: Not on file  Transportation Needs: Not on file  Physical Activity: Not on file  Stress: Not on file  Social Connections: Not on file  Intimate Partner Violence: Not on file    FAMILY HISTORY: Family History  Problem Relation Age of Onset   . Diabetes Mother   . Heart disease Mother   . Diabetes Father   . Lung cancer Father   . Hyperlipidemia Brother   . Clotting disorder Sister   . Hypertension Sister   . Asthma Daughter     ALLERGIES:  is allergic to vicodin [hydrocodone-acetaminophen] and lisinopril.  MEDICATIONS:  Current Outpatient Medications  Medication Sig Dispense Refill  . acetaminophen (TYLENOL) 500 MG tablet Take 500 mg by mouth daily as needed.    Marland Kitchen albuterol (VENTOLIN HFA) 108 (90 Base) MCG/ACT inhaler Inhale 2 puffs into the lungs every 4 (four) hours as needed.    Marland Kitchen atorvastatin (LIPITOR) 40 MG tablet Take 40 mg by mouth daily.    . benzonatate (TESSALON) 100 MG capsule Take 100-200 mg by mouth 3 (three) times daily as needed.    . calcium-vitamin D (OSCAL WITH D) 500-200 MG-UNIT TABS tablet Take 1 tablet by mouth daily.    . cetirizine (ZYRTEC) 10 MG tablet Take 1 tablet (10 mg total) by mouth daily. (Patient taking differently: Take 10 mg by mouth daily as  needed for allergies.) 30 tablet 3  . Cholecalciferol (VITAMIN D3) 25 MCG (1000 UT) CAPS Take 1 capsule by mouth daily.    . clonazePAM (KLONOPIN) 0.5 MG tablet Take 0.25-0.5 mg by mouth 2 (two) times daily as needed for anxiety.    Marland Kitchen ezetimibe (ZETIA) 10 MG tablet Take 10 mg by mouth daily.    . famotidine (PEPCID) 20 MG tablet Take 20 mg by mouth daily.    Marland Kitchen losartan (COZAAR) 50 MG tablet Take 50 mg by mouth daily.    . polyethylene glycol (MIRALAX / GLYCOLAX) packet Take 17 g by mouth once a week.    . sucralfate (CARAFATE) 1 g tablet Take 1 g by mouth 4 (four) times daily.    Marland Kitchen venlafaxine (EFFEXOR) 75 MG tablet Take 75 mg by mouth 2 (two) times daily.    Marland Kitchen buPROPion (WELLBUTRIN XL) 150 MG 24 hr tablet Take 150 mg by mouth daily.    Marland Kitchen glucose blood test strip OneTouch Ultra Blue Test Strip  USE AS DIRECTED TO CHECK BLOOD SUGAR 1 TIME(S)DAILY    . Lancets (ONETOUCH DELICA PLUS [id number]) MISC OneTouch Delica Plus Lancet 33 gauge  USE AS  DIRECTED TO CHECK BLOOD SUGAR 1 TIME(S) DAILY     No current facility-administered medications for this visit.    REVIEW OF SYSTEMS:   Constitutional: Denies fevers, chills or abnormal night sweats (+) fatigue (+) low appetite Eyes: Denies blurriness of vision, double vision or watery eyes Ears, nose, mouth, throat, and face: Denies mucositis or sore throat Respiratory: Denies cough, dyspnea or wheezes Cardiovascular: Denies palpitation, chest discomfort or lower extremity swelling Gastrointestinal:  Denies nausea, vomiting, constipation, diarrhea, heartburn or change in bowel habits (+) intermittent lower abdominal cramping Skin: Denies abnormal skin rashes Lymphatics: Denies new lymphadenopathy or easy bruising Neurological:Denies numbness, tingling or new weaknesses Behavioral/Psych: Mood is stable, no new changes (+) depression All other systems were reviewed with the patient and are negative.  PHYSICAL EXAMINATION: ECOG PERFORMANCE STATUS: 1 - Symptomatic but completely ambulatory  Vitals:   05/28/20 1041  BP: (!) 141/76  Pulse: 77  Resp: 18  Temp: (!) 96.4 F (35.8 C)  SpO2: 99%   Filed Weights   05/28/20 1041  Weight: 201 lb (91.2 kg)    GENERAL:alert, no distress and comfortable SKIN: No rash EYES:  sclera clear NECK: Without mass LYMPH:  no palpable cervical or supraclavicular lymphadenopathy  LUNGS: clear  with normal breathing effort HEART: regular rate & rhythm, no lower extremity edema ABDOMEN:abdomen soft and normal bowel sounds mild tenderness to the epigastrium.  No palpable mass or hepatomegaly Musculoskeletal:no cyanosis of digits and no clubbing  PSYCH: alert & oriented x 3 with fluent speech NEURO: no focal motor/sensory deficits  LABORATORY DATA:  I have reviewed the data as listed CBC Latest Ref Rng & Units 09/08/2019 09/06/2019 10/21/2015  WBC 4.0 - 10.5 K/uL 10.1 7.1 4.9  Hemoglobin 12.0 - 15.0 g/dL 11.4(L) 12.2 12.6  Hematocrit 36.0 - 46.0 %  35.1(L) 37.4 38.1  Platelets 150 - 400 K/uL 226 245 227   CMP Latest Ref Rng & Units 09/08/2019 09/06/2019 10/21/2015  Glucose 70 - 99 mg/dL 160(H) 116(H) 145(H)  BUN 8 - 23 mg/dL _0 Creatinine 0.44 - 1.00 mg/dL 0.81 0.94 0.97  Sodium 135 - 145 mmol/L 138 144 135  Potassium 3.5 - 5.1 mmol/L 3.9 4.3 4.9  Chloride 98 - 111 mmol/L 105 107 109  CO2 22 - 32 mmol/L 26 28  21(L)  Calcium 8.9 - 10.3 mg/dL 8.7(L) 8.8(L) 9.0  Total Protein 6.0 - 8.3 g/dL - - -  Total Bilirubin 0.3 - 1.2 mg/dL - - -  Alkaline Phos 39 - 117 U/L - - -  AST 0 - 37 U/L - - -  ALT 0 - 35 U/L - - -    RADIOGRAPHIC STUDIES: I have personally reviewed the radiological images as listed and agreed with the findings in the report. No results found.  ASSESSMENT & PLAN: 70 yo female   1. Neuroendocrine tumor of pancreatic head, cT2N0M0 G1, Ki67 <3% stage IB -we reviewed her outside imaging, EUS, and biopsy in detail -She had incidental finding of 2.8 cm pancreas head mass, path showed low grade NET. No significant vascular invasion on EUS, and no distant metastatic disease.  -baseline CA 19-9 is 6, chromogranin is pending -we reviewed the nature of neuroendocrine tumors which are typically slow growing with lower chance of metastatic spread when compared to adenocarcinomas, and standard treatment.  -we discussed the primary curative treatment is surgical resection, she will see Dr. Zenia Resides on 4/13. We will discuss her case in tumor board that morning.  -she is elderly with controlled co-morbidities, she has good PS. She is likely a surgical candidate. She also has strong family support for recovery.  -we discussed that for her localized, low grade tumor she would likely not need adjuvant therapy. And the plan would be surveillance after resection, with periodic lab/physical exam and scan every 1-2 years for up to 5 years.  -we are referring her to PET Cu-64 to complete pre-op staging, and lab today for baseline 24 hour Ur  5HIAA and CBC, CMP -Will call her with lab and PET scan results, and keep in touch with Dr. Zenia Resides -F/up open for now  2. Low abdominal cramping, secondary to #1 -she has intermittent lower abd cramping for 1-2 months, no diarrhea or significant weight loss  -relieved with rest and massage, no need for pain meds at this time  3. Depression -chronic, on klonopin, effexor, and wellnutrin -worsened slightly by NET diagnosis and worry, I discussed SW referral if needed -Denies SI -has strong family support, she is oldest sister with 12 siblings   48. HTN, HL, DM -manages DM with diet and physical activity -per PCP   5. H/o anemia and microcytosis  -She had mild anemia in the past Hg 11.4 on 09/08/19 with microcytosis MCV 78.3 -today's CBC shows normal Hg 13.3, she is not anemic, but MCV remains low 75.8.  -iron studies are pending   PLAN: -Medical records and work up reviewed -Lab today for baseline CBC, CMP, 24 hr Ur 5HIAA (chromogranin A is pending from 05/24/20) -PET detectnet in 1-2 weeks -Discuss case in GI conference -Consult Dr. Zenia Resides 05/30/20  -Will follow, f/up open   Orders Placed This Encounter  Procedures  . NM PET (CU-64 DETECTNET)SKULL TO MID THIGH    Standing Status:   Future    Standing Expiration Date:   05/28/2021    Order Specific Question:   If indicated for the ordered procedure, I authorize the administration of a radiopharmaceutical per Radiology protocol    Answer:   Yes    Order Specific Question:   Preferred imaging location?    Answer:   [facility]  . [place] [facility]    Standing Status:   Standing    Number of Occurrences:   10    Standing Expiration Date:  05/28/2021  . CBC with Differential (Cancer Center Only)    Standing Status:   Standing    Number of Occurrences:   20    Standing Expiration Date:   05/28/2021  . CMP ([facility] only)    Standing Status:   Standing    Number of Occurrences:   20    Standing Expiration Date:   05/28/2021   . Iron and TIBC    Standing Status:   Standing    Number of Occurrences:   1    Standing Expiration Date:   05/28/2021  . Ferritin    Standing Status:   Standing    Number of Occurrences:   1    Standing Expiration Date:   05/28/2021    All questions were answered. The patient knows to call the clinic with any problems, questions or concerns.     Alla Feeling, NP 05/28/2020   Addendum  I have seen the patient, examined her. I agree with the assessment and and plan and have edited the notes.   Ms. Casasola is a 70 year old female with past medical history of hypertension, diabetes, anemia, who was incidentally found to have a mass in head of pancreas during her CT scan for chest pain.  Biopsy revealed neuroendocrine tumor.  CT scan was negative for distant metastasis.  She is asymptomatic.  We discussed the natural history of neuroendocrine tumor, and treatment options.  Surgery is the only curative treatment, other she may not have any symptoms for many years even without surgery.  We discussed she would not need Whipple surgery, which is quite extensive.  She is scheduled to see surgeon Dr. Zenia Resides later this week.  I recommend dotatate PET scan before surgery to ruled out distant metastasis, it's very sensitive and very specific for neuroendocrine tumor.. She will not need adjuvant treatment if she has completed surgical resection.  I reviewed the surveillant plan after surgery. Will call her after PET scan.   Truitt Merle  05/28/2020

## 2020-05-28 ENCOUNTER — Inpatient Hospital Stay: Payer: Medicare Other

## 2020-05-28 ENCOUNTER — Inpatient Hospital Stay: Payer: Medicare Other | Attending: Nurse Practitioner | Admitting: Nurse Practitioner

## 2020-05-28 ENCOUNTER — Other Ambulatory Visit: Payer: Self-pay

## 2020-05-28 ENCOUNTER — Encounter: Payer: Self-pay | Admitting: Nurse Practitioner

## 2020-05-28 VITALS — BP 141/76 | HR 77 | Temp 96.4°F | Resp 18 | Ht 67.0 in | Wt 201.0 lb

## 2020-05-28 DIAGNOSIS — R5383 Other fatigue: Secondary | ICD-10-CM | POA: Insufficient documentation

## 2020-05-28 DIAGNOSIS — I10 Essential (primary) hypertension: Secondary | ICD-10-CM | POA: Diagnosis not present

## 2020-05-28 DIAGNOSIS — E119 Type 2 diabetes mellitus without complications: Secondary | ICD-10-CM | POA: Diagnosis not present

## 2020-05-28 DIAGNOSIS — R630 Anorexia: Secondary | ICD-10-CM | POA: Insufficient documentation

## 2020-05-28 DIAGNOSIS — R918 Other nonspecific abnormal finding of lung field: Secondary | ICD-10-CM | POA: Insufficient documentation

## 2020-05-28 DIAGNOSIS — Z8249 Family history of ischemic heart disease and other diseases of the circulatory system: Secondary | ICD-10-CM | POA: Insufficient documentation

## 2020-05-28 DIAGNOSIS — Z836 Family history of other diseases of the respiratory system: Secondary | ICD-10-CM | POA: Diagnosis not present

## 2020-05-28 DIAGNOSIS — E785 Hyperlipidemia, unspecified: Secondary | ICD-10-CM | POA: Diagnosis not present

## 2020-05-28 DIAGNOSIS — K449 Diaphragmatic hernia without obstruction or gangrene: Secondary | ICD-10-CM | POA: Insufficient documentation

## 2020-05-28 DIAGNOSIS — K8689 Other specified diseases of pancreas: Secondary | ICD-10-CM

## 2020-05-28 DIAGNOSIS — Z885 Allergy status to narcotic agent status: Secondary | ICD-10-CM | POA: Diagnosis not present

## 2020-05-28 DIAGNOSIS — F32A Depression, unspecified: Secondary | ICD-10-CM | POA: Diagnosis not present

## 2020-05-28 DIAGNOSIS — R1030 Lower abdominal pain, unspecified: Secondary | ICD-10-CM | POA: Diagnosis not present

## 2020-05-28 DIAGNOSIS — Z833 Family history of diabetes mellitus: Secondary | ICD-10-CM | POA: Diagnosis not present

## 2020-05-28 DIAGNOSIS — Z87891 Personal history of nicotine dependence: Secondary | ICD-10-CM | POA: Diagnosis not present

## 2020-05-28 DIAGNOSIS — Z8349 Family history of other endocrine, nutritional and metabolic diseases: Secondary | ICD-10-CM | POA: Insufficient documentation

## 2020-05-28 DIAGNOSIS — R634 Abnormal weight loss: Secondary | ICD-10-CM | POA: Insufficient documentation

## 2020-05-28 DIAGNOSIS — Z832 Family history of diseases of the blood and blood-forming organs and certain disorders involving the immune mechanism: Secondary | ICD-10-CM | POA: Diagnosis not present

## 2020-05-28 DIAGNOSIS — Z801 Family history of malignant neoplasm of trachea, bronchus and lung: Secondary | ICD-10-CM | POA: Insufficient documentation

## 2020-05-28 DIAGNOSIS — C7A8 Other malignant neuroendocrine tumors: Secondary | ICD-10-CM | POA: Diagnosis present

## 2020-05-28 DIAGNOSIS — Z79899 Other long term (current) drug therapy: Secondary | ICD-10-CM | POA: Insufficient documentation

## 2020-05-28 DIAGNOSIS — Z888 Allergy status to other drugs, medicaments and biological substances status: Secondary | ICD-10-CM | POA: Diagnosis not present

## 2020-05-28 LAB — CBC WITH DIFFERENTIAL (CANCER CENTER ONLY)
Abs Immature Granulocytes: 0.03 10*3/uL (ref 0.00–0.07)
Basophils Absolute: 0.1 10*3/uL (ref 0.0–0.1)
Basophils Relative: 1 %
Eosinophils Absolute: 0.2 10*3/uL (ref 0.0–0.5)
Eosinophils Relative: 3 %
HCT: 40.3 % (ref 36.0–46.0)
Hemoglobin: 13.3 g/dL (ref 12.0–15.0)
Immature Granulocytes: 1 %
Lymphocytes Relative: 50 %
Lymphs Abs: 3.3 10*3/uL (ref 0.7–4.0)
MCH: 25 pg — ABNORMAL LOW (ref 26.0–34.0)
MCHC: 33 g/dL (ref 30.0–36.0)
MCV: 75.8 fL — ABNORMAL LOW (ref 80.0–100.0)
Monocytes Absolute: 0.6 10*3/uL (ref 0.1–1.0)
Monocytes Relative: 9 %
Neutro Abs: 2.4 10*3/uL (ref 1.7–7.7)
Neutrophils Relative %: 36 %
Platelet Count: 277 10*3/uL (ref 150–400)
RBC: 5.32 MIL/uL — ABNORMAL HIGH (ref 3.87–5.11)
RDW: 15.2 % (ref 11.5–15.5)
WBC Count: 6.6 10*3/uL (ref 4.0–10.5)
nRBC: 0 % (ref 0.0–0.2)

## 2020-05-28 LAB — CMP (CANCER CENTER ONLY)
ALT: 38 U/L (ref 0–44)
AST: 24 U/L (ref 15–41)
Albumin: 4.2 g/dL (ref 3.5–5.0)
Alkaline Phosphatase: 92 U/L (ref 38–126)
Anion gap: 12 (ref 5–15)
BUN: 9 mg/dL (ref 8–23)
CO2: 27 mmol/L (ref 22–32)
Calcium: 9.5 mg/dL (ref 8.9–10.3)
Chloride: 103 mmol/L (ref 98–111)
Creatinine: 0.92 mg/dL (ref 0.44–1.00)
GFR, Estimated: >60 mL/min (ref >60)
Glucose, Bld: 97 mg/dL (ref 70–99)
Potassium: 3.9 mmol/L (ref 3.5–5.1)
Sodium: 142 mmol/L (ref 135–145)
Total Bilirubin: 0.3 mg/dL (ref 0.3–1.2)
Total Protein: 7.7 g/dL (ref 6.5–8.1)

## 2020-05-28 LAB — IRON AND TIBC
Iron: 101 ug/dL (ref 41–142)
Saturation Ratios: 38 % (ref 21–57)
TIBC: 265 ug/dL (ref 236–444)
UIBC: 164 ug/dL (ref 120–384)

## 2020-05-28 LAB — FERRITIN: Ferritin: 139 ng/mL (ref 11–307)

## 2020-05-28 LAB — CHROMOGRANIN A: Chromogranin A (ng/mL): 62.4 ng/mL (ref 0.0–101.8)

## 2020-05-28 NOTE — Progress Notes (Signed)
Met with patient and her sister Ruby at her initial medical oncology consult with Cira Rue NP and Dr. Truitt Merle.  I explained my role as nurse navigator.  She was given my direct contact information and encouraged to call with any questions or concerns.

## 2020-05-30 ENCOUNTER — Other Ambulatory Visit: Payer: Self-pay

## 2020-05-30 ENCOUNTER — Ambulatory Visit: Payer: Self-pay | Admitting: Surgery

## 2020-05-30 DIAGNOSIS — C7A8 Other malignant neuroendocrine tumors: Secondary | ICD-10-CM | POA: Diagnosis not present

## 2020-05-30 DIAGNOSIS — K8689 Other specified diseases of pancreas: Secondary | ICD-10-CM

## 2020-05-30 NOTE — H&P (Signed)
History of Present Illness (Mahek Schlesinger L. Zenia Resides MD; 05/30/2020 1:26 PM) The patient is a 70 year old female who presents with a pancreatic mass.HPI: Ms. Sorci is a 70 yo female who was referred with a newly-diagnosed pancreatic neuroendocrine tumor. She was evaluated for chest tightness by her PCP in February and underwent a CT PE, which incidentally showed a mass in the head of the pancreas. She subsequently had a dedicated CT abd/pelvis which confirmed a 2.7cm hyperenhancing mass in the head of the pancreas (scans were done at [facility]). She was then referred to [facility] and underwent an EGD/EUS by Dr. Rush Landmark on 3/31 which showed a 2.8cm mass in the head/uncinate process. There was no invasion of the SMA or celiac trunk. FNA showed neoplastic cells, and IHC was consistent with a neuroendocrine neoplasm. It was staged as a uT2N0.  The patient reports occasional cramping abdominal pain, as well as constipation. No diarrhea, nausea or vomiting. She reports some dyspnea on exertion but is able to complete her ADLs and ambulate moderate distances. She was started on albuterol for her shortness of breath and reports she uses it about once per week.  Chromogranin A: 62 CA19-9: 6  PMH: HTN, GERD, DM, arthritis  PSH: hysterectomy, ORIF ankle, right rotator cuff repair  FHx: DM (both parents), lung cancer (father)  Social: Former smoker (quit 1970)    Allergies Emeline Gins, [place]; 05/30/2020 10:11 AM) Vicodin *ANALGESICS - OPIOID*  Lisinopril *CHEMICALS*  Allergies Reconciled   Medication History Emeline Gins, CMA; 05/30/2020 10:12 AM) buPROPion HCl ER (XL) (150MG  Tablet ER 24HR, Oral) Active. Losartan Potassium (50MG  Tablet, Oral) Active. Ezetimibe (10MG  Tablet, Oral) Active. Benzonatate (100MG  Capsule, Oral) Active. Albuterol Sulfate HFA (108 (90 Base)MCG/ACT Aerosol Soln, Inhalation) Active. clonazePAM (0.5MG  Tablet, Oral) Active. Venlafaxine HCl (75MG  Tablet, Oral)  Active. Sucralfate (1GM Tablet, Oral) Active. OneTouch Delica Plus [id number] Active. Famotidine (20MG  Tablet, Oral) Active. Atorvastatin Calcium (40MG  Tablet, Oral) Active. Medications Reconciled  Vitals Emeline Gins CMA; 05/30/2020 10:10 AM) 05/30/2020 10:10 AM Weight: 199.5 lb Height: 64in Body Surface Area: 1.95 m Body Mass Index: 34.24 kg/m  Temp.: 97.24F  Pulse: 91 (Regular)  P.OX: 98% (Room air) BP: 138/70(Sitting, Left Arm, Standard)       Physical Exam (Keevan Wolz L. Zenia Resides MD; 05/30/2020 2:57 PM) The physical exam findings are as follows: Note: Constitutional: No acute distress; conversant; no deformities Neuro: alert and oriented; cranial nerves grossly in tact; no focal deficits Eyes: Moist conjunctiva; anicteric sclerae; extraocular movements in tact Neck: Trachea midline Lungs: Normal respiratory effort; lungs clear to auscultation bilaterally, no wheezes; symmetric chest wall expansion CV: Regular rate and rhythm; no murmurs; no pitting edema GI: Abdomen soft, nontender, nondistended; no masses or organomegaly; no surgical scars MSK: Normal gait and station; no clubbing/cyanosis Psychiatric: Appropriate affect; alert and oriented 3    Assessment & Plan (Harbert Fitterer L. Zenia Resides MD; 05/30/2020 3:04 PM) NEUROENDOCRINE TUMOR OF PANCREAS (D3A.8) Story: 70 yo female presenting with a uT2N0 2.8cm neuroendocrine tumor of the head of the pancreas. I have personally reviewed her EGD reports, referral notes and CT scan. There is a well-circumscribed mass in the head of the pancreas, with no distortion of the SMV. SMA is free. Right hepatic artery is diminutive and is replaced off the SMA. There are no signs of metastatic disease within the abdomen. She brought her CTA chest on a disc today, will have it uploaded to PACS for review. Labs sent 2 days ago from oncology clinic are unremarkable. 24hr urine 5HT  is pending.  I discussed the patient that based on the size of  her tumor, up-front surgical excision is recommended. She will require a Whipple for this. I discussed the details of this procedure extensively with the patient and her sister, including the overall 20% risk of major complication, 09% risk of pancreatic fistula, 10-15% risk of delayed gastric empyting, and risks of bile leak and GJ leak. I discussed that she will be hospitalized for at least 5-7 days postoperatively and possibly longer if she has a complication. We also discussed this is a major operation that takes most people several months to completely recover from. She and her sister expressed understanding and the patient agrees to proceed with surgery. She was provided with an education booklet on pancreatic surgery. Encouraged to consume 2-3 protein shakes daily and go on daily walks to prepare for surgery.  It is unclear what workup patient had for her recent dyspnea, so I will reach out to her PCP to determine if further workup is needed prior to surgery. She overall seems to have a good performance status and is a reasonable surgical candidate. She will be contacted to schedule a surgery date.

## 2020-05-31 ENCOUNTER — Telehealth: Payer: Self-pay

## 2020-05-31 ENCOUNTER — Encounter: Payer: Self-pay | Admitting: Nurse Practitioner

## 2020-05-31 NOTE — Telephone Encounter (Signed)
CD with CT angio done at [facility] on 03/23/2020  taken to [facility] to be loaded into PACS.

## 2020-06-06 ENCOUNTER — Inpatient Hospital Stay
Admission: RE | Admit: 2020-06-06 | Discharge: 2020-06-06 | Disposition: A | Discharge status: Home / Self Care | Payer: Self-pay | Source: Ambulatory Visit | Attending: Hematology | Admitting: Hematology

## 2020-06-06 ENCOUNTER — Other Ambulatory Visit (HOSPITAL_COMMUNITY): Payer: Self-pay | Admitting: Hematology

## 2020-06-06 ENCOUNTER — Other Ambulatory Visit: Payer: Self-pay

## 2020-06-06 DIAGNOSIS — C801 Malignant (primary) neoplasm, unspecified: Secondary | ICD-10-CM

## 2020-06-07 ENCOUNTER — Telehealth: Payer: Self-pay | Admitting: Hematology

## 2020-06-07 NOTE — Telephone Encounter (Signed)
Scheduled follow-up appointment per 4/17 staff message. Patient is aware.

## 2020-06-08 LAB — 5 HIAA, QUANTITATIVE, URINE, 24 HOUR
5-HIAA, Ur: 5.4 mg/L
5-HIAA,Quant.,24 Hr Urine: 14 mg/24 hr (ref 0.0–14.9)
Total Volume: 2600

## 2020-06-12 ENCOUNTER — Telehealth: Payer: Self-pay

## 2020-06-12 NOTE — Telephone Encounter (Signed)
I called the patient to make her aware of her PET scan appointment scheduled for 06/25/20 and advised her to arrive by 9:30 am at [facility]. I also informed the patient that her urine tumor marker test was normal. The patient verbalized understanding.

## 2020-06-13 ENCOUNTER — Other Ambulatory Visit: Payer: Self-pay

## 2020-06-13 ENCOUNTER — Encounter: Payer: Self-pay | Admitting: Cardiology

## 2020-06-13 ENCOUNTER — Ambulatory Visit: Payer: Medicare Other | Admitting: Cardiology

## 2020-06-13 VITALS — BP 144/73 | HR 85 | Temp 98.2°F | Resp 16 | Ht 67.0 in | Wt 202.0 lb

## 2020-06-13 DIAGNOSIS — Z01818 Encounter for other preprocedural examination: Secondary | ICD-10-CM | POA: Insufficient documentation

## 2020-06-13 NOTE — Progress Notes (Signed)
Patient referred by Bernerd Limbo, MD for pre-op evaluation  Subjective:   Alyssa Armstrong, female    DOB: Nov 25, 1950, 70 y.o.   MRN: [id number]   Chief Complaint  Patient presents with  . Shortness of Breath  . Hypertension  . New Patient (Initial Visit)  . Medical Clearance     HPI  70 y.o. African female with type 2 DM, new diagnosis of pancreatic tumor, here for pre-op evaluation  At baseline, patient was active, without any angina/ angina equivalent symptoms. However, her physical activity has been down since her ankle injury in 08/2019. More recently, she was diagnosed with a pancreatic tumor which reportedly is cancerous. Surgery is scheduled for 07/03/2020.   Past Medical History:  Diagnosis Date  . Arthritis   . Bronchitis   . Chronic prescription benzodiazepine use   . Combined hyperlipidemia associated with type 2 diabetes mellitus ([place])   . Depression 55YRS AGO  . Diabetes mellitus without complication ([place])   . GERD (gastroesophageal reflux disease)   . Hypertension   . 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 Seasonal allergies   . Sensorineural hearing loss (SNHL) of both ears   . Vitamin D deficiency      Past Surgical History:  Procedure Laterality Date  . ABDOMINAL HYSTERECTOMY     PARTIAL, have ovaries  . BIOPSY  05/17/2020   Procedure: BIOPSY;  Surgeon: Rush Landmark Telford Nab., MD;  Location: [facility];  Service: Gastroenterology;;  . Lillard Anes Left 2009  . ESOPHAGOGASTRODUODENOSCOPY (EGD) WITH PROPOFOL N/A 05/17/2020   Procedure: ESOPHAGOGASTRODUODENOSCOPY (EGD) WITH PROPOFOL;  Surgeon: Rush Landmark Telford Nab., MD;  Location: [facility];  Service: Gastroenterology;  Laterality: N/A;  . EUS N/A 05/17/2020   Procedure: UPPER ENDOSCOPIC ULTRASOUND (EUS) RADIAL;  Surgeon: Rush Landmark Telford Nab., MD;  Location: [facility];  Service: Gastroenterology;  Laterality: N/A;  . FINE NEEDLE  ASPIRATION  05/17/2020   Procedure: FINE NEEDLE ASPIRATION (FNA) LINEAR;  Surgeon: Irving Copas., MD;  Location: [facility];  Service: Gastroenterology;;  . ORIF ANKLE FRACTURE Bilateral 09/07/2019   Procedure: OPEN REDUCTION INTERNAL FIXATION (ORIF), RIGHT MALLEOLAR ANKLE FRACTURE;  Surgeon: Marybelle Killings, MD;  Location: [facility];  Service: Orthopedics;  Laterality: Bilateral;  . ROTATOR CUFF REPAIR Right 2008     Social History   Tobacco Use  Smoking Status Former Smoker  . Types: Cigarettes  . Quit date: 14  . Years since quitting: 52.3  Smokeless Tobacco Never Used    Social History   Substance and Sexual Activity  Alcohol Use Not Currently   Comment: beer or wine occasional     Family History  Problem Relation Age of Onset  . Diabetes Mother   . Heart disease Mother   . Diabetes Father   . Lung cancer Father   . Hyperlipidemia Brother   . Clotting disorder Sister   . Hypertension Sister   . Asthma Daughter      Current Outpatient Medications on File Prior to Visit  Medication Sig Dispense Refill  . acetaminophen (TYLENOL) 500 MG tablet Take 500 mg by mouth daily as needed.    Marland Kitchen albuterol (VENTOLIN HFA) 108 (90 Base) MCG/ACT inhaler Inhale 2 puffs into the lungs every 4 (four) hours as needed.    Marland Kitchen atorvastatin (LIPITOR) 40 MG tablet Take 40 mg by mouth daily.    . benzonatate (TESSALON) 100 MG capsule Take 100-200 mg by mouth 3 (three) times  daily as needed.    Marland Kitchen buPROPion (WELLBUTRIN XL) 150 MG 24 hr tablet Take 150 mg by mouth daily.    . calcium-vitamin D (OSCAL WITH D) 500-200 MG-UNIT TABS tablet Take 1 tablet by mouth daily.    . cetirizine (ZYRTEC) 10 MG tablet Take 1 tablet (10 mg total) by mouth daily. (Patient taking differently: Take 10 mg by mouth daily as needed for allergies.) 30 tablet 3  . Cholecalciferol (VITAMIN D3) 25 MCG (1000 UT) CAPS Take 1 capsule by mouth daily.    . clonazePAM (KLONOPIN) 0.5 MG tablet Take 0.25-0.5 mg by mouth 2  (two) times daily as needed for anxiety.    Marland Kitchen ezetimibe (ZETIA) 10 MG tablet Take 10 mg by mouth daily.    . famotidine (PEPCID) 20 MG tablet Take 20 mg by mouth daily.    Marland Kitchen glucose blood test strip OneTouch Ultra Blue Test Strip  USE AS DIRECTED TO CHECK BLOOD SUGAR 1 TIME(S)DAILY    . Lancets (ONETOUCH DELICA PLUS [id number]) MISC OneTouch Delica Plus Lancet 33 gauge  USE AS DIRECTED TO CHECK BLOOD SUGAR 1 TIME(S) DAILY    . losartan (COZAAR) 50 MG tablet Take 50 mg by mouth daily.    . polyethylene glycol (MIRALAX / GLYCOLAX) packet Take 17 g by mouth once a week.    . sucralfate (CARAFATE) 1 g tablet Take 1 g by mouth 4 (four) times daily.    Marland Kitchen venlafaxine (EFFEXOR) 75 MG tablet Take 75 mg by mouth 2 (two) times daily.     No current facility-administered medications on file prior to visit.    Cardiovascular and other pertinent studies:  EKG 06/13/2020: Sinus rhythm 98 bpm  Left atrial enlargement.  Nonspecific T-abnormality   Recent labs: 05/28/2020: Glucose 97, BUN/Cr 9/0.92. EGFR >60. Na/K 142/3.9. Rest of the CMP normal H/H 13/40. MCV 75. Platelets 277 HbA1C N/A Lipid panel N/A TSH N/A    Review of Systems  Cardiovascular: Negative for chest pain, dyspnea on exertion, leg swelling, palpitations and syncope.  Musculoskeletal: Positive for joint pain.         Vitals:   06/13/20 1506  BP: (!) 144/73  Pulse: 85  Resp: 16  Temp: 98.2 F (36.8 C)  SpO2: 97%     Body mass index is 31.64 kg/m. Filed Weights   06/13/20 1506  Weight: 202 lb (91.6 kg)     Objective:   Physical Exam Vitals and nursing note reviewed.  Constitutional:      General: She is not in acute distress. Neck:     Vascular: No JVD.  Cardiovascular:     Rate and Rhythm: Normal rate and regular rhythm.     Heart sounds: Normal heart sounds. No murmur heard.   Pulmonary:     Effort: Pulmonary effort is normal.     Breath sounds: Normal breath sounds. No wheezing or rales.           Assessment & Recommendations:   70 y.o. African female with type 2 DM, new diagnosis of pancreatic tumor, here for pre-op evaluation  Major pancreatic surgery scheduled for 07/03/2020. Functional capacity evaluation limited due to her musculoskeletal issues-including ankle injury. Recommend echocardiogram, lexiscan nuclear stress test for further risk stratification. Unless any severe abnormalities found, I will recommend proceeding with surgery.   Thank you for referring the patient to [place]. Please feel free to contact with any questions.   Nigel Mormon, MD Pager: [phone] Office: [phone]

## 2020-06-25 ENCOUNTER — Other Ambulatory Visit: Payer: Self-pay

## 2020-06-25 ENCOUNTER — Ambulatory Visit (HOSPITAL_COMMUNITY)
Admission: RE | Admit: 2020-06-25 | Discharge: 2020-06-25 | Disposition: A | Discharge status: Home / Self Care | Payer: Medicare Other | Source: Ambulatory Visit | Attending: Nurse Practitioner | Admitting: Nurse Practitioner

## 2020-06-25 DIAGNOSIS — C7A8 Other malignant neuroendocrine tumors: Secondary | ICD-10-CM | POA: Diagnosis present

## 2020-06-25 MED ORDER — COPPER CU 64 DOTATATE 1 MCI/ML IV SOLN
4.0000 | Freq: Once | INTRAVENOUS | Status: AC
  Administered 2020-06-25: 4.12 via INTRAVENOUS

## 2020-06-27 ENCOUNTER — Telehealth: Payer: Self-pay

## 2020-06-27 NOTE — Telephone Encounter (Signed)
-----   Message from Alla Feeling, NP sent at 06/26/2020  9:25 AM EDT ----- Please let her know the only finding is known pancreatic head tumor, no evidence of metastatic disease. Ok to proceed with surgery with Dr. Zenia Resides as planned.   Thanks, Regan Rakers, NP

## 2020-06-27 NOTE — Telephone Encounter (Signed)
This nurse spoke with patient and informed of tests results, per NP.  Advised patient that she can continue with surgical plans with Dr. Zenia Resides.  No further questions or concerns at this time.

## 2020-06-28 NOTE — Progress Notes (Signed)
Surgical Instructions    Your procedure is scheduled on Thursday, May 19th, 2022.  Report to [facility] Main Entrance "A" at 05:45 A.M., then check in with the Admitting office.  Call this number if you have problems the morning of surgery:  [phone]   If you have any questions prior to your surgery date call [phone]: Open Monday-Friday 8am-4pm    Remember:  Do not eat after midnight the night before your surgery  You may drink clear liquids until 04:45 A.M. the morning of your surgery.   Clear liquids allowed are: Water, Non-Citrus Juices (without pulp), Carbonated Beverages, Clear Tea, Black Coffee Only, and Gatorade    Take these medicines the morning of surgery with A SIP OF WATER   atorvastatin (LIPITOR) buPROPion (WELLBUTRIN XL)  cetirizine (ZYRTEC) ezetimibe (ZETIA)  famotidine (PEPCID) venlafaxine (EFFEXOR)  sucralfate (CARAFATE)  If needed:   acetaminophen (TYLENOL) albuterol (VENTOLIN HFA) - please, bring the inhaler with you benzonatate (TESSALON) clonazePAM (KLONOPIN)  As of today, STOP taking any Aspirin (unless otherwise instructed by your surgeon) Aleve, Naproxen, Ibuprofen, Motrin, Advil, Goody's, BC's, all herbal medications, fish oil, and all vitamins.   HOW TO MANAGE YOUR DIABETES BEFORE AND AFTER SURGERY  Why is it important to control my blood sugar before and after surgery? . Improving blood sugar levels before and after surgery helps healing and can limit problems. . A way of improving blood sugar control is eating a healthy diet by: o  Eating less sugar and carbohydrates o  Increasing activity/exercise o  Talking with your doctor about reaching your blood sugar goals . High blood sugars (greater than 180 mg/dL) can raise your risk of infections and slow your recovery, so you will need to focus on controlling your diabetes during the weeks before surgery. . Make sure that the doctor who takes care of your diabetes knows about your planned  surgery including the date and location.  How do I manage my blood sugar before surgery? . Check your blood sugar at least 4 times a day, starting 2 days before surgery, to make sure that the level is not too high or low. . Check your blood sugar the morning of your surgery when you wake up and every 2 hours until you get to the Short Stay unit. o If your blood sugar is less than 70 mg/dL, you will need to treat for low blood sugar: - Do not take insulin. - Treat a low blood sugar (less than 70 mg/dL) with  cup of clear juice (cranberry or apple), 4 glucose tablets, OR glucose gel. - Recheck blood sugar in 15 minutes after treatment (to make sure it is greater than 70 mg/dL). If your blood sugar is not greater than 70 mg/dL on recheck, call [phone] for further instructions. . Report your blood sugar to the short stay nurse when you get to Short Stay.  . If you are admitted to the hospital after surgery: o Your blood sugar will be checked by the staff and you will probably be given insulin after surgery (instead of oral diabetes medicines) to make sure you have good blood sugar levels. o The goal for blood sugar control after surgery is 80-180 mg/dL.                      Do not wear jewelry, make up, or nail polish            Do not wear lotions, powders, perfumes, or deodorant.  Do not shave 48 hours prior to surgery.              Do not bring valuables to the hospital.            [facility] is not responsible for any belongings or valuables.  Do NOT Smoke (Tobacco/Vaping) or drink Alcohol 24 hours prior to your procedure If you use a CPAP at night, you may bring all equipment for your overnight stay.   Contacts, glasses, dentures or bridgework may not be worn into surgery, please bring cases for these belongings   For patients admitted to the hospital, discharge time will be determined by your treatment team.   Patients discharged the day of surgery will not be allowed  to drive home, and someone needs to stay with them for 24 hours.    Special instructions:   [facility]- Preparing For Surgery  Before surgery, you can play an important role. Because skin is not sterile, your skin needs to be as free of germs as possible. You can reduce the number of germs on your skin by washing with CHG (chlorahexidine gluconate) Soap before surgery.  CHG is an antiseptic cleaner which kills germs and bonds with the skin to continue killing germs even after washing.    Oral Hygiene is also important to reduce your risk of infection.  Remember - BRUSH YOUR TEETH THE MORNING OF SURGERY WITH YOUR REGULAR TOOTHPASTE  Please do not use if you have an allergy to CHG or antibacterial soaps. If your skin becomes reddened/irritated stop using the CHG.  Do not shave (including legs and underarms) for at least 48 hours prior to first CHG shower. It is OK to shave your face.  Please follow these instructions carefully.   1. Shower the NIGHT BEFORE SURGERY and the MORNING OF SURGERY  2. If you chose to wash your hair, wash your hair first as usual with your normal shampoo.  3. After you shampoo, rinse your hair and body thoroughly to remove the shampoo.  4. Use CHG Soap as you would any other liquid soap. You can apply CHG directly to the skin and wash gently with a scrungie or a clean washcloth.   5. Apply the CHG Soap to your body ONLY FROM THE NECK DOWN.  Do not use on open wounds or open sores. Avoid contact with your eyes, ears, mouth and genitals (private parts). Wash Face and genitals (private parts)  with your normal soap.   6. Wash thoroughly, paying special attention to the area where your surgery will be performed.  7. Thoroughly rinse your body with warm water from the neck down.  8. DO NOT shower/wash with your normal soap after using and rinsing off the CHG Soap.  9. Pat yourself dry with a CLEAN TOWEL.  10. Wear CLEAN PAJAMAS to bed the night before  surgery  11. Place CLEAN SHEETS on your bed the night before your surgery  12. DO NOT SLEEP WITH PETS.   Day of Surgery: Take a shower with CHG soap. Wear Clean/Comfortable clothing the morning of surgery Do not apply any deodorants/lotions.   Remember to brush your teeth WITH YOUR REGULAR TOOTHPASTE.   Please read over the following fact sheets that you were given.

## 2020-06-29 ENCOUNTER — Other Ambulatory Visit: Payer: Self-pay

## 2020-06-29 ENCOUNTER — Other Ambulatory Visit: Payer: Medicare Other

## 2020-06-29 ENCOUNTER — Encounter (HOSPITAL_COMMUNITY)
Admission: RE | Admit: 2020-06-29 | Discharge: 2020-06-29 | Disposition: A | Discharge status: Home / Self Care | Payer: Medicare Other | Source: Ambulatory Visit | Attending: Surgery | Admitting: Surgery

## 2020-06-29 ENCOUNTER — Encounter (HOSPITAL_COMMUNITY): Payer: Self-pay

## 2020-06-29 DIAGNOSIS — D72820 Lymphocytosis (symptomatic): Secondary | ICD-10-CM | POA: Diagnosis not present

## 2020-06-29 DIAGNOSIS — D3A8 Other benign neuroendocrine tumors: Secondary | ICD-10-CM | POA: Diagnosis not present

## 2020-06-29 DIAGNOSIS — Z6834 Body mass index (BMI) 34.0-34.9, adult: Secondary | ICD-10-CM | POA: Insufficient documentation

## 2020-06-29 DIAGNOSIS — H905 Unspecified sensorineural hearing loss: Secondary | ICD-10-CM | POA: Insufficient documentation

## 2020-06-29 DIAGNOSIS — I10 Essential (primary) hypertension: Secondary | ICD-10-CM | POA: Insufficient documentation

## 2020-06-29 DIAGNOSIS — E119 Type 2 diabetes mellitus without complications: Secondary | ICD-10-CM | POA: Diagnosis not present

## 2020-06-29 DIAGNOSIS — K219 Gastro-esophageal reflux disease without esophagitis: Secondary | ICD-10-CM | POA: Insufficient documentation

## 2020-06-29 DIAGNOSIS — E669 Obesity, unspecified: Secondary | ICD-10-CM | POA: Insufficient documentation

## 2020-06-29 DIAGNOSIS — E785 Hyperlipidemia, unspecified: Secondary | ICD-10-CM | POA: Insufficient documentation

## 2020-06-29 DIAGNOSIS — Z87891 Personal history of nicotine dependence: Secondary | ICD-10-CM | POA: Diagnosis not present

## 2020-06-29 DIAGNOSIS — Z79899 Other long term (current) drug therapy: Secondary | ICD-10-CM | POA: Diagnosis not present

## 2020-06-29 DIAGNOSIS — Z01812 Encounter for preprocedural laboratory examination: Secondary | ICD-10-CM | POA: Insufficient documentation

## 2020-06-29 DIAGNOSIS — F419 Anxiety disorder, unspecified: Secondary | ICD-10-CM | POA: Insufficient documentation

## 2020-06-29 HISTORY — DX: Anxiety disorder, unspecified: F41.9

## 2020-06-29 LAB — BASIC METABOLIC PANEL
Anion gap: 6 (ref 5–15)
BUN: 7 mg/dL — ABNORMAL LOW (ref 8–23)
CO2: 29 mmol/L (ref 22–32)
Calcium: 9.5 mg/dL (ref 8.9–10.3)
Chloride: 104 mmol/L (ref 98–111)
Creatinine, Ser: 0.86 mg/dL (ref 0.44–1.00)
GFR, Estimated: >60 mL/min (ref >60)
Glucose, Bld: 140 mg/dL — ABNORMAL HIGH (ref 70–99)
Potassium: 3.8 mmol/L (ref 3.5–5.1)
Sodium: 139 mmol/L (ref 135–145)

## 2020-06-29 LAB — HEMOGLOBIN A1C
Hgb A1c MFr Bld: 6.9 % — ABNORMAL HIGH (ref 4.8–5.6)
Mean Plasma Glucose: 151.33 mg/dL

## 2020-06-29 LAB — GLUCOSE, CAPILLARY: Glucose-Capillary: 154 mg/dL — ABNORMAL HIGH (ref 70–99)

## 2020-06-29 LAB — CBC
HCT: 41.4 % (ref 36.0–46.0)
Hemoglobin: 13.5 g/dL (ref 12.0–15.0)
MCH: 25.1 pg — ABNORMAL LOW (ref 26.0–34.0)
MCHC: 32.6 g/dL (ref 30.0–36.0)
MCV: 77 fL — ABNORMAL LOW (ref 80.0–100.0)
Platelets: 263 10*3/uL (ref 150–400)
RBC: 5.38 MIL/uL — ABNORMAL HIGH (ref 3.87–5.11)
RDW: 15.6 % — ABNORMAL HIGH (ref 11.5–15.5)
WBC: 6.4 10*3/uL (ref 4.0–10.5)
nRBC: 0 % (ref 0.0–0.2)

## 2020-06-29 LAB — PREPARE RBC (CROSSMATCH)

## 2020-06-29 NOTE — Progress Notes (Signed)
PCP - Bernerd Limbo, MD Cardiologist - Sanda Klein, MD  PPM/ICD - denies Device Orders - N/A Rep Notified - N/A  Chest x-ray - N/A EKG - 06/13/2020 Stress Test - denies ECHO - denies Cardiac Cath - denies  Sleep Study - denies CPAP - N/A  CBG today 154 CBG between 129-154 Checks Blood Sugar once a week A1C done today  Blood Thinner Instructions: N/A Aspirin Instructions: Patient was instructed: As of today, STOP taking any Aspirin (unless otherwise instructed by your surgeon) Aleve, Naproxen, Ibuprofen, Motrin, Advil, Goody's, BC's, all herbal medications, fish oil, and all vitamins.  ERAS Protcol - yes PRE-SURGERY drink - no  COVID TEST- scheduled for 07/02/2020 @ 14:45   Anesthesia review:  Yes - medical clearace  Patient denies shortness of breath, fever, cough and chest pain at PAT appointment   All instructions explained to the patient, with a verbal understanding of the material. Patient agrees to go over the instructions while at home for a better understanding. Patient also instructed to self quarantine after being tested for COVID-19. The opportunity to ask questions was provided.

## 2020-07-02 ENCOUNTER — Ambulatory Visit: Payer: Medicare Other

## 2020-07-02 ENCOUNTER — Other Ambulatory Visit (HOSPITAL_COMMUNITY)
Admission: RE | Admit: 2020-07-02 | Discharge: 2020-07-02 | Disposition: A | Discharge status: Home / Self Care | Payer: Medicare Other | Source: Ambulatory Visit | Attending: Surgery | Admitting: Surgery

## 2020-07-02 ENCOUNTER — Other Ambulatory Visit: Payer: Self-pay

## 2020-07-02 DIAGNOSIS — Z20822 Contact with and (suspected) exposure to covid-19: Secondary | ICD-10-CM | POA: Insufficient documentation

## 2020-07-02 DIAGNOSIS — Z01818 Encounter for other preprocedural examination: Secondary | ICD-10-CM

## 2020-07-02 DIAGNOSIS — Z01812 Encounter for preprocedural laboratory examination: Secondary | ICD-10-CM | POA: Insufficient documentation

## 2020-07-02 LAB — SARS CORONAVIRUS 2 (TAT 6-24 HRS): SARS Coronavirus 2: NEGATIVE

## 2020-07-02 NOTE — Anesthesia Preprocedure Evaluation (Addendum)
Anesthesia Evaluation  Patient identified by MRN, date of birth, ID band Patient awake    Reviewed: Allergy & Precautions, NPO status , Patient's Chart, lab work & pertinent test results  Airway Mallampati: II  TM Distance: >3 FB Neck ROM: Full    Dental  (+) Teeth Intact   Pulmonary neg pulmonary ROS, former smoker,    Pulmonary exam normal        Cardiovascular hypertension, Pt. on medications  Rhythm:Regular Rate:Normal     Neuro/Psych Anxiety Depression negative neurological ROS     GI/Hepatic Neg liver ROS, GERD  Medicated,Pancreatic neuroendocrine tumor   Endo/Other  diabetes, Type 2  Renal/GU negative Renal ROS  negative genitourinary   Musculoskeletal  (+) Arthritis , Osteoarthritis,    Abdominal (+)  Abdomen: soft. Bowel sounds: normal.  Peds  Hematology negative hematology ROS (+)   Anesthesia Other Findings   Reproductive/Obstetrics                           Anesthesia Physical Anesthesia Plan  ASA: III  Anesthesia Plan: General and Epidural   Post-op Pain Management:    Induction: Intravenous  PONV Risk Score and Plan: 3 and Ondansetron, Dexamethasone, Midazolam and Treatment may vary due to age or medical condition  Airway Management Planned: Mask and Oral ETT  Additional Equipment: Arterial line  Intra-op Plan:   Post-operative Plan: Extubation in OR  Informed Consent: I have reviewed the patients History and Physical, chart, labs and discussed the procedure including the risks, benefits and alternatives for the proposed anesthesia with the patient or authorized representative who has indicated his/her understanding and acceptance.     Dental advisory given  Plan Discussed with: CRNA  Anesthesia Plan Comments: (PAT note written by Myra Gianotti, PA-C. Lab Results      Component                Value               Date                      WBC                       6.4                 06/29/2020                HGB                      13.5                06/29/2020                HCT                      41.4                06/29/2020                MCV                      77.0 (L)            06/29/2020                PLT  263                 06/29/2020           Lab Results      Component                Value               Date                      NA                       139                 06/29/2020                K                        3.8                 06/29/2020                CO2                      29                  06/29/2020                GLUCOSE                  140 (H)             06/29/2020                BUN                      7 (L)               06/29/2020                CREATININE               0.86                06/29/2020                CALCIUM                  9.5                 06/29/2020                GFRNONAA                 >60                 06/29/2020                GFRAA                    >60                 09/08/2019           )      Anesthesia Quick Evaluation

## 2020-07-02 NOTE — Progress Notes (Addendum)
Anesthesia Chart Review:  Case: [id number] Date/Time: 07/05/20 0730   Procedure: WHIPPLE PROCEDURE (N/A )   Anesthesia type: General   Pre-op diagnosis: PANCREATIC NEUROENDORINE TUMOR   Location: [facility] OR ROOM 02 / [place] [facility]   Surgeons: Dwan Bolt, MD      DISCUSSION: Patient is a 69 year old female scheduled for the above procedure.   History includes former smoker (quit 02/18/68), HTN, DM2, HLD, GERD, lymphocytosis, sensorineural hearing loss, anxiety, chronic benzodiazepine use (on clonazepam BID as needed). BMI is consistent with obesity.   Per oncology notes on 05/28/20 by Cira Rue, NP, patient underwent a CTA 03/23/20 per primary care after work-up for chest tightness/congestion showed a mildly elevated D-dimer. The CTA was negative for PE "but showed an incidental 4 mm left lower lobe nodule as well as a 3.5 x 2 cm masslike lesion in the region of the pancreatic head.  Subsequent CT AP on 03/28/2020 showed an enhancing oval mass arising from the pancreas felt to represent a neuroendocrine tumor or GIST.  No evidence of metastatic disease in the abdomen or pelvis. She was referred to GI and underwent EUS by Dr. Rush Landmark on 05/17/2020.  EGD showed hiatal hernia and gastritis.  EUS showed a round mass in the region of the head/uncinate process of the pancreas measuring 28 mm x 28 mm with well-defined borders.  There was sonographic evidence suggesting invasion of the tributary off the SMA though the SMA itself was felt to be intact.  FNA path showed neoplastic cells with neuroendocrine differentiation by IHC and Ki67 index less than 3%, a grade 1 well-differentiated neuroendocrine tumor was favored but solid pseudopapillary neoplasm could not be entirely excluded.  This was staged T2N0 by endosonographic criteria.  CA 19-9 is 6 on 05/24/2020, chromogranin A is pending."  She had a preoperative cardiology evaluation visit with Dr. Virgina Jock on 06/13/20. No anginal/anginal equivalent symptoms at that time,  but not as active since an ankle injury in 08/2019. He recommended an echocardiogram and Lexiscan nuclear stress test "for further risk stratification. Unless any severe abnormalities found, I will recommend proceeding with surgery." Testing done on 07/02/20.    07/02/20 preoperative COVID-19 test, stress test, and echo are in process. Chart will be left for follow-up. (UPDATE 07/04/20 12:04 PM: Reassuring stress and echo from 07/02/20. Felt to be "low risk for perioperative CV complication" per Dr. Virgina Jock.  COVID-19 test negative.)   VS: BP 140/79   Pulse 72   Temp 36.9 C (Oral)   Resp 18   Ht 5' 4"  (1.626 m)   Wt 91.9 kg   SpO2 94%   BMI 34.77 kg/m    PROVIDERS: Bernerd Limbo, MD is PCP Vernell Leep, MD is cardiologist Truitt Merle, MD is oncologist Smithville Cellar, MD is GI   LABS: Labs reviewed: Acceptable for surgery. LFTs normal 05/28/20.  (all labs ordered are listed, but only abnormal results are displayed)  Labs Reviewed  GLUCOSE, CAPILLARY - Abnormal; Notable for the following components:      Result Value   Glucose-Capillary 154 (*)    All other components within normal limits  BASIC METABOLIC PANEL - Abnormal; Notable for the following components:   Glucose, Bld 140 (*)    BUN 7 (*)    All other components within normal limits  HEMOGLOBIN A1C - Abnormal; Notable for the following components:   Hgb A1c MFr Bld 6.9 (*)    All other components within normal limits  CBC - Abnormal; Notable for  the following components:   RBC 5.38 (*)    MCV 77.0 (*)    MCH 25.1 (*)    RDW 15.6 (*)    All other components within normal limits  TYPE AND SCREEN  PREPARE RBC (CROSSMATCH)     IMAGES: CT Abd/pelvis 03/28/20 ([facility]): IMPRESSION:  1. Enhancing oval mass which appears to arise from the pancreas. The appearance is not typical for adenocarcinoma and would consider other possibilities such as neuroendocrine tumor or GIST tumor. No evidence of metastatic  disease.  2. Tiny hypodense renal lesions too small to characterize most likely small cysts.   CTA Chest 03/23/20 ([facility]): IMPRESSION:  1. No pulmonary embolism. Evidence of any focal, no evidence of any focal consolidation or infiltrate in the lungs.  2. 4 mm nodule in the left lower lobe. Please see reformations below.  3. There is a 3.5 x 2 point centimeter masslike lesion in the region of the pancreatic head which appears to be enhancing. Differential includes pancreatic tumor, enlarged lymph node, or other GI tumor. Further evaluation with dedicated CT abdomen pelvis and consultation for potential EUS is recommended.   Excerpt from "Guidelines for Management of Incidental Pulmonary Nodules Detected on CT Images: From the Fleischner Society 2017" Radiology 2017.   Note: Guidelines do not apply to lung cancer screening, patients with immunosuppression, or patients with known primary cancer.   Nodule 51m or less:  Low risk- No routine follow up.  High risk- Optional CT at 12 months.   Nodule 6-847m  Low Risk- CT at 6-12 months; then consider CT at 18-24 months.  High Risk- CT at 6-12 months; then CT at 18-24 months.   Nodule 37m81mr greater:  Low Risk: Consider CT at 3 months, PET/CT, or tissue sampling.  High Risk: Consider CT at 3 months, PET/CT, or tissue sampling.   (Nodules less than 6 mm do not require routine follow-up, but certain patients at high risk with suspicious nodule morphology, upper lobe location, or both may warrant 12 month follow up).     EKG: EKG 06/13/2020: Sinus rhythm 98 bpm  Left atrial enlargement.  Nonspecific T-abnormality   CV: Echocardiogram 07/02/2020:  Left ventricle cavity is normal in size. Mild concentric hypertrophy of  the left ventricle. Normal global wall motion. Normal LV systolic function  with EF 67%. Doppler evidence of grade I (impaired) diastolic dysfunction,  normal LAP.  Mild (Grade I) mitral regurgitation.  Normal right  atrial pressure.   Lexiscan Tetrofosmin stress test 07/02/2020: Lexiscan nuclear stress test performed using 1-day protocol. Normal myocardial perfusion. Stress LVEF 78%. Low risk study.   Past Medical History:  Diagnosis Date  . Anxiety   . Arthritis   . Bronchitis   . Chronic prescription benzodiazepine use   . Combined hyperlipidemia associated with type 2 diabetes mellitus ([place] . Depression 35YRS AGO  . Diabetes mellitus without complication ([place] . GERD (gastroesophageal reflux disease)   . Hypertension   . 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 Seasonal allergies   . Sensorineural hearing loss (SNHL) of both ears   . Vitamin D deficiency     Past Surgical History:  Procedure Laterality Date  . ABDOMINAL HYSTERECTOMY     PARTIAL, have ovaries  . BIOPSY  05/17/2020   Procedure: BIOPSY;  Surgeon: ManRush LandmarkbTelford NabMD;  [facility] [place];;  . BUNLillard Anesft 2009  .  ESOPHAGOGASTRODUODENOSCOPY (EGD) WITH PROPOFOL N/A 05/17/2020   Procedure: ESOPHAGOGASTRODUODENOSCOPY (EGD) WITH PROPOFOL;  Surgeon: Rush Landmark Telford Nab., MD;  Location: [place];  Service: Gastroenterology;  Laterality: N/A;  . EUS N/A 05/17/2020   Procedure: UPPER ENDOSCOPIC ULTRASOUND (EUS) RADIAL;  Surgeon: Rush Landmark Telford Nab., MD;  Location: [place];  Service: Gastroenterology;  Laterality: N/A;  . EYE SURGERY     bilateral cataract  . FINE NEEDLE ASPIRATION  05/17/2020   Procedure: FINE NEEDLE ASPIRATION (FNA) LINEAR;  Surgeon: Irving Copas., MD;  Location: [place];  Service: Gastroenterology;;  . ORIF ANKLE FRACTURE Bilateral 09/07/2019   Procedure: OPEN REDUCTION INTERNAL FIXATION (ORIF), RIGHT MALLEOLAR ANKLE FRACTURE;  Surgeon: Marybelle Killings, MD;  Location[facility];  Service: Orthopedics;  Laterality: Bilateral;  . ROTATOR CUFF REPAIR Right 2008  . TONSILLECTOMY     as a child     MEDICATIONS: . acetaminophen (TYLENOL) 500 MG tablet  . albuterol (VENTOLIN HFA) 108 (90 Base) MCG/ACT inhaler  . atorvastatin (LIPITOR) 40 MG tablet  . benzonatate (TESSALON) 100 MG capsule  . buPROPion (WELLBUTRIN XL) 150 MG 24 hr tablet  . calcium-vitamin D (OSCAL WITH D) 500-200 MG-UNIT TABS tablet  . cetirizine (ZYRTEC) 10 MG tablet  . Cholecalciferol (VITAMIN D3) 25 MCG (1000 UT) CAPS  . clonazePAM (KLONOPIN) 0.5 MG tablet  . ezetimibe (ZETIA) 10 MG tablet  . famotidine (PEPCID) 20 MG tablet  . glucose blood test strip  . Lancets (ONETOUCH DELICA PLUS [id number]) MISC  . losartan (COZAAR) 50 MG tablet  . polyethylene glycol (MIRALAX / GLYCOLAX) packet  . sucralfate (CARAFATE) 1 g tablet  . venlafaxine (EFFEXOR) 75 MG tablet   No current facility-administered medications for this encounter.    Myra Gianotti, PA-C Surgical Short Stay/Anesthesiology [facility] Phone [phone] [facility] Phone [phone] 07/02/2020 3:33 PM

## 2020-07-03 NOTE — Progress Notes (Signed)
Called pt to inform her about her stress test results

## 2020-07-05 ENCOUNTER — Inpatient Hospital Stay (HOSPITAL_COMMUNITY): Payer: Medicare Other | Admitting: Vascular Surgery

## 2020-07-05 ENCOUNTER — Inpatient Hospital Stay (HOSPITAL_COMMUNITY): Payer: Medicare Other | Admitting: Anesthesiology

## 2020-07-05 ENCOUNTER — Inpatient Hospital Stay (HOSPITAL_COMMUNITY)
Admission: RE | Admit: 2020-07-05 | Discharge: 2020-07-12 | DRG: 407 | Disposition: A | Discharge status: Home / Self Care | Payer: Medicare Other | Attending: General Surgery | Admitting: General Surgery

## 2020-07-05 ENCOUNTER — Encounter (HOSPITAL_COMMUNITY)
Admission: RE | Disposition: A | Discharge status: Home / Self Care | Payer: Self-pay | Source: Home / Self Care | Attending: Surgery

## 2020-07-05 DIAGNOSIS — M2140 Flat foot [pes planus] (acquired), unspecified foot: Secondary | ICD-10-CM | POA: Diagnosis not present

## 2020-07-05 DIAGNOSIS — F419 Anxiety disorder, unspecified: Secondary | ICD-10-CM | POA: Diagnosis not present

## 2020-07-05 DIAGNOSIS — I10 Essential (primary) hypertension: Secondary | ICD-10-CM | POA: Diagnosis present

## 2020-07-05 DIAGNOSIS — K219 Gastro-esophageal reflux disease without esophagitis: Secondary | ICD-10-CM | POA: Diagnosis present

## 2020-07-05 DIAGNOSIS — E559 Vitamin D deficiency, unspecified: Secondary | ICD-10-CM | POA: Diagnosis present

## 2020-07-05 DIAGNOSIS — F32A Depression, unspecified: Secondary | ICD-10-CM | POA: Diagnosis present

## 2020-07-05 DIAGNOSIS — C254 Malignant neoplasm of endocrine pancreas: Secondary | ICD-10-CM | POA: Diagnosis not present

## 2020-07-05 DIAGNOSIS — Z87891 Personal history of nicotine dependence: Secondary | ICD-10-CM | POA: Diagnosis not present

## 2020-07-05 DIAGNOSIS — Z8249 Family history of ischemic heart disease and other diseases of the circulatory system: Secondary | ICD-10-CM

## 2020-07-05 DIAGNOSIS — Z83438 Family history of other disorder of lipoprotein metabolism and other lipidemia: Secondary | ICD-10-CM

## 2020-07-05 DIAGNOSIS — R339 Retention of urine, unspecified: Secondary | ICD-10-CM | POA: Diagnosis not present

## 2020-07-05 DIAGNOSIS — Z885 Allergy status to narcotic agent status: Secondary | ICD-10-CM

## 2020-07-05 DIAGNOSIS — H903 Sensorineural hearing loss, bilateral: Secondary | ICD-10-CM | POA: Diagnosis present

## 2020-07-05 DIAGNOSIS — Z79899 Other long term (current) drug therapy: Secondary | ICD-10-CM | POA: Diagnosis not present

## 2020-07-05 DIAGNOSIS — K8689 Other specified diseases of pancreas: Secondary | ICD-10-CM | POA: Diagnosis not present

## 2020-07-05 DIAGNOSIS — Z833 Family history of diabetes mellitus: Secondary | ICD-10-CM

## 2020-07-05 DIAGNOSIS — E782 Mixed hyperlipidemia: Secondary | ICD-10-CM | POA: Diagnosis present

## 2020-07-05 DIAGNOSIS — E1169 Type 2 diabetes mellitus with other specified complication: Secondary | ICD-10-CM | POA: Diagnosis not present

## 2020-07-05 DIAGNOSIS — D490 Neoplasm of unspecified behavior of digestive system: Secondary | ICD-10-CM | POA: Diagnosis present

## 2020-07-05 DIAGNOSIS — D3A8 Other benign neuroendocrine tumors: Secondary | ICD-10-CM | POA: Diagnosis present

## 2020-07-05 DIAGNOSIS — C7A1 Malignant poorly differentiated neuroendocrine tumors: Secondary | ICD-10-CM | POA: Diagnosis present

## 2020-07-05 DIAGNOSIS — Z888 Allergy status to other drugs, medicaments and biological substances status: Secondary | ICD-10-CM | POA: Diagnosis not present

## 2020-07-05 DIAGNOSIS — C7A8 Other malignant neuroendocrine tumors: Secondary | ICD-10-CM | POA: Diagnosis present

## 2020-07-05 DIAGNOSIS — Z20822 Contact with and (suspected) exposure to covid-19: Secondary | ICD-10-CM | POA: Diagnosis present

## 2020-07-05 HISTORY — PX: WHIPPLE PROCEDURE: SHX2667

## 2020-07-05 LAB — COMPREHENSIVE METABOLIC PANEL
ALT: 80 U/L — ABNORMAL HIGH (ref 0–44)
AST: 101 U/L — ABNORMAL HIGH (ref 15–41)
Albumin: 3.3 g/dL — ABNORMAL LOW (ref 3.5–5.0)
Alkaline Phosphatase: 55 U/L (ref 38–126)
Anion gap: 6 (ref 5–15)
BUN: 11 mg/dL (ref 8–23)
CO2: 24 mmol/L (ref 22–32)
Calcium: 8.2 mg/dL — ABNORMAL LOW (ref 8.9–10.3)
Chloride: 108 mmol/L (ref 98–111)
Creatinine, Ser: 0.92 mg/dL (ref 0.44–1.00)
GFR, Estimated: >60 mL/min (ref >60)
Glucose, Bld: 191 mg/dL — ABNORMAL HIGH (ref 70–99)
Potassium: 4 mmol/L (ref 3.5–5.1)
Sodium: 138 mmol/L (ref 135–145)
Total Bilirubin: 0.7 mg/dL (ref 0.3–1.2)
Total Protein: 5.4 g/dL — ABNORMAL LOW (ref 6.5–8.1)

## 2020-07-05 LAB — POCT I-STAT 7, (LYTES, BLD GAS, ICA,H+H)
Acid-Base Excess: 0 mmol/L (ref 0.0–2.0)
Acid-Base Excess: 0 mmol/L (ref 0.0–2.0)
Bicarbonate: 24 mmol/L (ref 20.0–28.0)
Bicarbonate: 24.2 mmol/L (ref 20.0–28.0)
Calcium, Ion: 1.18 mmol/L (ref 1.15–1.40)
Calcium, Ion: 1.13 mmol/L — ABNORMAL LOW (ref 1.15–1.40)
HCT: 34 % — ABNORMAL LOW (ref 36.0–46.0)
HCT: 33 % — ABNORMAL LOW (ref 36.0–46.0)
Hemoglobin: 11.6 g/dL — ABNORMAL LOW (ref 12.0–15.0)
Hemoglobin: 11.2 g/dL — ABNORMAL LOW (ref 12.0–15.0)
O2 Saturation: 99 %
O2 Saturation: 100 %
Potassium: 3.9 mmol/L (ref 3.5–5.1)
Potassium: 3.9 mmol/L (ref 3.5–5.1)
Sodium: 141 mmol/L (ref 135–145)
Sodium: 140 mmol/L (ref 135–145)
TCO2: 25 mmol/L (ref 22–32)
TCO2: 25 mmol/L (ref 22–32)
pCO2 arterial: 36.9 mmHg (ref 32.0–48.0)
pCO2 arterial: 38.2 mmHg (ref 32.0–48.0)
pH, Arterial: 7.421 (ref 7.350–7.450)
pH, Arterial: 7.41 (ref 7.350–7.450)
pO2, Arterial: 140 mmHg — ABNORMAL HIGH (ref 83.0–108.0)
pO2, Arterial: 250 mmHg — ABNORMAL HIGH (ref 83.0–108.0)

## 2020-07-05 LAB — GLUCOSE, CAPILLARY
Glucose-Capillary: 133 mg/dL — ABNORMAL HIGH (ref 70–99)
Glucose-Capillary: 141 mg/dL — ABNORMAL HIGH (ref 70–99)
Glucose-Capillary: 158 mg/dL — ABNORMAL HIGH (ref 70–99)
Glucose-Capillary: 178 mg/dL — ABNORMAL HIGH (ref 70–99)
Glucose-Capillary: 184 mg/dL — ABNORMAL HIGH (ref 70–99)

## 2020-07-05 LAB — POCT I-STAT, CHEM 8
BUN: 13 mg/dL (ref 8–23)
Calcium, Ion: 1.2 mmol/L (ref 1.15–1.40)
Chloride: 104 mmol/L (ref 98–111)
Creatinine, Ser: 0.7 mg/dL (ref 0.44–1.00)
Glucose, Bld: 153 mg/dL — ABNORMAL HIGH (ref 70–99)
HCT: 41 % (ref 36.0–46.0)
Hemoglobin: 13.9 g/dL (ref 12.0–15.0)
Potassium: 3.9 mmol/L (ref 3.5–5.1)
Sodium: 140 mmol/L (ref 135–145)
TCO2: 23 mmol/L (ref 22–32)

## 2020-07-05 LAB — CBC
HCT: 31.9 % — ABNORMAL LOW (ref 36.0–46.0)
Hemoglobin: 10.6 g/dL — ABNORMAL LOW (ref 12.0–15.0)
MCH: 25.5 pg — ABNORMAL LOW (ref 26.0–34.0)
MCHC: 33.2 g/dL (ref 30.0–36.0)
MCV: 76.9 fL — ABNORMAL LOW (ref 80.0–100.0)
Platelets: 229 10*3/uL (ref 150–400)
RBC: 4.15 MIL/uL (ref 3.87–5.11)
RDW: 15.7 % — ABNORMAL HIGH (ref 11.5–15.5)
WBC: 12.7 10*3/uL — ABNORMAL HIGH (ref 4.0–10.5)
nRBC: 0 % (ref 0.0–0.2)

## 2020-07-05 LAB — MRSA PCR SCREENING: MRSA by PCR: NEGATIVE

## 2020-07-05 LAB — ABO/RH: ABO/RH(D): O POS

## 2020-07-05 SURGERY — WHIPPLE PROCEDURE
Anesthesia: Epidural | Site: Abdomen

## 2020-07-05 MED ORDER — ROPIVACAINE HCL 2 MG/ML IJ SOLN
8.0000 mL/h | INTRAMUSCULAR | Status: DC
  Filled 2020-07-05: qty 200

## 2020-07-05 MED ORDER — SUGAMMADEX SODIUM 200 MG/2ML IV SOLN
INTRAVENOUS | Status: DC | PRN
  Administered 2020-07-05: 200 mg via INTRAVENOUS

## 2020-07-05 MED ORDER — CHLORHEXIDINE GLUCONATE 0.12 % MT SOLN
15.0000 mL | Freq: Two times a day (BID) | OROMUCOSAL | Status: DC
  Administered 2020-07-05 – 2020-07-12 (×13): 15 mL via OROMUCOSAL
  Filled 2020-07-05 (×12): qty 15

## 2020-07-05 MED ORDER — FENTANYL CITRATE (PF) 250 MCG/5ML IJ SOLN
INTRAMUSCULAR | Status: AC
  Filled 2020-07-05: qty 5

## 2020-07-05 MED ORDER — ACETAMINOPHEN 10 MG/ML IV SOLN: 1000.0000 mg | Freq: Once | INTRAVENOUS | Status: DC | PRN

## 2020-07-05 MED ORDER — FENTANYL CITRATE (PF) 250 MCG/5ML IJ SOLN
INTRAMUSCULAR | Status: DC | PRN
  Administered 2020-07-05 (×5): 50 ug via INTRAVENOUS

## 2020-07-05 MED ORDER — NALOXONE HCL 0.4 MG/ML IJ SOLN: 0.4000 mg | INTRAMUSCULAR | Status: DC | PRN

## 2020-07-05 MED ORDER — LACTATED RINGERS IV SOLN: INTRAVENOUS | Status: DC | PRN

## 2020-07-05 MED ORDER — PANTOPRAZOLE SODIUM 40 MG IV SOLR
40.0000 mg | INTRAVENOUS | Status: DC
  Administered 2020-07-05 – 2020-07-08 (×4): 40 mg via INTRAVENOUS
  Filled 2020-07-05 (×3): qty 40

## 2020-07-05 MED ORDER — CEFAZOLIN SODIUM-DEXTROSE 2-4 GM/100ML-% IV SOLN
2.0000 g | INTRAVENOUS | Status: AC
  Administered 2020-07-05 (×2): 2 g via INTRAVENOUS
  Filled 2020-07-05: qty 100

## 2020-07-05 MED ORDER — ONDANSETRON HCL 4 MG/2ML IJ SOLN
INTRAMUSCULAR | Status: DC | PRN
  Administered 2020-07-05: 4 mg via INTRAVENOUS

## 2020-07-05 MED ORDER — LACTATED RINGERS IV SOLN: INTRAVENOUS | Status: DC

## 2020-07-05 MED ORDER — SODIUM CHLORIDE 0.9 % IV SOLN: 8.0000 mL/h | Status: DC

## 2020-07-05 MED ORDER — HEMOSTATIC AGENTS (NO CHARGE) OPTIME
TOPICAL | Status: DC | PRN
  Administered 2020-07-05: 1 via TOPICAL

## 2020-07-05 MED ORDER — MIDAZOLAM HCL 5 MG/5ML IJ SOLN
INTRAMUSCULAR | Status: DC | PRN
  Administered 2020-07-05 (×2): 1 mg via INTRAVENOUS

## 2020-07-05 MED ORDER — ONDANSETRON HCL 4 MG/2ML IJ SOLN
4.0000 mg | Freq: Four times a day (QID) | INTRAMUSCULAR | Status: DC | PRN
  Administered 2020-07-06 – 2020-07-09 (×5): 4 mg via INTRAVENOUS
  Filled 2020-07-05 (×5): qty 2

## 2020-07-05 MED ORDER — PROPOFOL 10 MG/ML IV BOLUS
INTRAVENOUS | Status: AC
  Filled 2020-07-05: qty 40

## 2020-07-05 MED ORDER — SODIUM CHLORIDE 0.9% FLUSH: 9.0000 mL | INTRAVENOUS | Status: DC | PRN

## 2020-07-05 MED ORDER — ORAL CARE MOUTH RINSE
15.0000 mL | Freq: Two times a day (BID) | OROMUCOSAL | Status: DC
  Administered 2020-07-06 – 2020-07-12 (×11): 15 mL via OROMUCOSAL

## 2020-07-05 MED ORDER — CHLORHEXIDINE GLUCONATE CLOTH 2 % EX PADS
6.0000 | MEDICATED_PAD | Freq: Every day | CUTANEOUS | Status: DC
  Administered 2020-07-06 – 2020-07-11 (×6): 6 via TOPICAL

## 2020-07-05 MED ORDER — DIPHENHYDRAMINE HCL 50 MG/ML IJ SOLN: 12.5000 mg | Freq: Four times a day (QID) | INTRAMUSCULAR | Status: DC | PRN

## 2020-07-05 MED ORDER — PHENYLEPHRINE 40 MCG/ML (10ML) SYRINGE FOR IV PUSH (FOR BLOOD PRESSURE SUPPORT)
PREFILLED_SYRINGE | INTRAVENOUS | Status: DC | PRN
  Administered 2020-07-05: 80 ug via INTRAVENOUS
  Administered 2020-07-05: 120 ug via INTRAVENOUS
  Administered 2020-07-05 (×2): 40 ug via INTRAVENOUS
  Administered 2020-07-05: 120 ug via INTRAVENOUS

## 2020-07-05 MED ORDER — HYDROMORPHONE 1 MG/ML IV SOLN
INTRAVENOUS | Status: AC
  Filled 2020-07-05: qty 30

## 2020-07-05 MED ORDER — LORAZEPAM 2 MG/ML IJ SOLN
0.5000 mg | Freq: Three times a day (TID) | INTRAMUSCULAR | Status: DC | PRN
  Administered 2020-07-09 – 2020-07-10 (×2): 0.5 mg via INTRAVENOUS
  Filled 2020-07-05 (×2): qty 1

## 2020-07-05 MED ORDER — ORAL CARE MOUTH RINSE: 15.0000 mL | Freq: Once | OROMUCOSAL | Status: AC

## 2020-07-05 MED ORDER — PROMETHAZINE HCL 25 MG/ML IJ SOLN: 6.2500 mg | INTRAMUSCULAR | Status: DC | PRN

## 2020-07-05 MED ORDER — HYDROMORPHONE 1 MG/ML IV SOLN
INTRAVENOUS | Status: DC
  Administered 2020-07-05: 30 mg via INTRAVENOUS
  Administered 2020-07-05: 0.8 mg via INTRAVENOUS
  Administered 2020-07-05: 1.2 mg via INTRAVENOUS
  Administered 2020-07-06: 0.6 mg via INTRAVENOUS
  Administered 2020-07-07: 4 mg via INTRAVENOUS
  Administered 2020-07-07: 0.2 mg via INTRAVENOUS
  Administered 2020-07-07: 1.2 mg via INTRAVENOUS

## 2020-07-05 MED ORDER — 0.9 % SODIUM CHLORIDE (POUR BTL) OPTIME
TOPICAL | Status: DC | PRN
  Administered 2020-07-05 (×4): 1000 mL

## 2020-07-05 MED ORDER — ROPIVACAINE HCL 2 MG/ML IJ SOLN
8.0000 mL/h | INTRAMUSCULAR | Status: DC
  Administered 2020-07-06 – 2020-07-09 (×4): 8 mL/h via EPIDURAL
  Filled 2020-07-05 (×8): qty 200

## 2020-07-05 MED ORDER — ALBUTEROL SULFATE HFA 108 (90 BASE) MCG/ACT IN AERS
2.0000 | INHALATION_SPRAY | RESPIRATORY_TRACT | Status: DC | PRN
  Filled 2020-07-05: qty 6.7

## 2020-07-05 MED ORDER — PROPOFOL 10 MG/ML IV BOLUS
INTRAVENOUS | Status: DC | PRN
  Administered 2020-07-05: 130 mg via INTRAVENOUS

## 2020-07-05 MED ORDER — CHLORHEXIDINE GLUCONATE 0.12 % MT SOLN
15.0000 mL | Freq: Once | OROMUCOSAL | Status: AC
  Administered 2020-07-05: 15 mL via OROMUCOSAL
  Filled 2020-07-05: qty 15

## 2020-07-05 MED ORDER — LIDOCAINE 2% (20 MG/ML) 5 ML SYRINGE
INTRAMUSCULAR | Status: DC | PRN
  Administered 2020-07-05: 70 mg via INTRAVENOUS

## 2020-07-05 MED ORDER — ROPIVACAINE HCL 2 MG/ML IJ SOLN
8.0000 mL/h | INTRAMUSCULAR | Status: AC
  Administered 2020-07-05: 10 mL/h via EPIDURAL
  Filled 2020-07-05: qty 200

## 2020-07-05 MED ORDER — MIDAZOLAM HCL 2 MG/2ML IJ SOLN
INTRAMUSCULAR | Status: AC
  Filled 2020-07-05: qty 2

## 2020-07-05 MED ORDER — SODIUM CHLORIDE 0.9 % IV SOLN
8.0000 mL/h | Status: DC
  Filled 2020-07-05: qty 100

## 2020-07-05 MED ORDER — ONDANSETRON 4 MG PO TBDP
4.0000 mg | ORAL_TABLET | Freq: Four times a day (QID) | ORAL | Status: DC | PRN
  Administered 2020-07-10 – 2020-07-11 (×2): 4 mg via ORAL
  Filled 2020-07-05 (×2): qty 1

## 2020-07-05 MED ORDER — ROCURONIUM BROMIDE 10 MG/ML (PF) SYRINGE
PREFILLED_SYRINGE | INTRAVENOUS | Status: DC | PRN
  Administered 2020-07-05 (×2): 30 mg via INTRAVENOUS
  Administered 2020-07-05: 50 mg via INTRAVENOUS
  Administered 2020-07-05: 30 mg via INTRAVENOUS
  Administered 2020-07-05: 50 mg via INTRAVENOUS
  Administered 2020-07-05: 30 mg via INTRAVENOUS

## 2020-07-05 MED ORDER — DEXAMETHASONE SODIUM PHOSPHATE 10 MG/ML IJ SOLN
INTRAMUSCULAR | Status: DC | PRN
  Administered 2020-07-05: 10 mg via INTRAVENOUS

## 2020-07-05 MED ORDER — PHENYLEPHRINE HCL-NACL 10-0.9 MG/250ML-% IV SOLN
INTRAVENOUS | Status: DC | PRN
  Administered 2020-07-05: 50 ug/min via INTRAVENOUS
  Administered 2020-07-05 (×2): 30 ug/min via INTRAVENOUS

## 2020-07-05 MED ORDER — METRONIDAZOLE 500 MG/100ML IV SOLN
500.0000 mg | INTRAVENOUS | Status: AC
  Administered 2020-07-05: 500 mg via INTRAVENOUS
  Filled 2020-07-05: qty 100

## 2020-07-05 MED ORDER — ALBUMIN HUMAN 5 % IV SOLN: INTRAVENOUS | Status: DC | PRN

## 2020-07-05 MED ORDER — INSULIN ASPART 100 UNIT/ML IJ SOLN
0.0000 [IU] | INTRAMUSCULAR | Status: DC
  Administered 2020-07-05: 2 [IU] via SUBCUTANEOUS
  Administered 2020-07-05 (×2): 3 [IU] via SUBCUTANEOUS
  Administered 2020-07-06 – 2020-07-08 (×8): 2 [IU] via SUBCUTANEOUS

## 2020-07-05 MED ORDER — HEPARIN SODIUM (PORCINE) 5000 UNIT/ML IJ SOLN
5000.0000 [IU] | Freq: Three times a day (TID) | INTRAMUSCULAR | Status: DC
  Administered 2020-07-06 – 2020-07-10 (×13): 5000 [IU] via SUBCUTANEOUS
  Filled 2020-07-05 (×13): qty 1

## 2020-07-05 MED ORDER — FENTANYL CITRATE (PF) 100 MCG/2ML IJ SOLN: 25.0000 ug | INTRAMUSCULAR | Status: DC | PRN

## 2020-07-05 MED ORDER — ORAL CARE MOUTH RINSE: 15.0000 mL | Freq: Two times a day (BID) | OROMUCOSAL | Status: DC

## 2020-07-05 SURGICAL SUPPLY — 122 items
ADH SKN CLS APL DERMABOND .7 (GAUZE/BANDAGES/DRESSINGS) ×2
APL PRP STRL LF DISP 70% ISPRP (MISCELLANEOUS) ×1
BAG BILE T-TUBES STRL (MISCELLANEOUS) ×2 IMPLANT
BAG DRAINAGE 600ML DEPOT (BAG) IMPLANT
BAG DRN 24 TWST VLV ADJ (BAG)
BAG DRN 9.5 2 ADJ BELT ADPR (MISCELLANEOUS) ×2
BIOPATCH RED 1 DISK 7.0 (GAUZE/BANDAGES/DRESSINGS) ×3 IMPLANT
BLADE CLIPPER SURG (BLADE) IMPLANT
BOOT SUTURE AID YELLOW STND (SUTURE) ×4 IMPLANT
BRR ADH 5X3 SEPRAFILM 6 SHT (MISCELLANEOUS)
CANISTER SUCT 3000ML PPV (MISCELLANEOUS) ×2 IMPLANT
CATH ROBINSON RED A/P 16FR (CATHETERS) IMPLANT
CHLORAPREP W/TINT 26 (MISCELLANEOUS) ×2 IMPLANT
CLIP VESOCCLUDE LG 6/CT (CLIP) ×3 IMPLANT
CLIP VESOCCLUDE MED 24/CT (CLIP) ×2 IMPLANT
CLIP VESOCCLUDE SM WIDE 24/CT (CLIP) ×1 IMPLANT
CNTNR URN SCR LID CUP LEK RST (MISCELLANEOUS) IMPLANT
CONT SPEC 4OZ STRL OR WHT (MISCELLANEOUS)
COUNTER NEEDLE 20 DBL MAG RED (NEEDLE) IMPLANT
COVER MAYO STAND STRL (DRAPES) ×1 IMPLANT
COVER SURGICAL LIGHT HANDLE (MISCELLANEOUS) ×2 IMPLANT
COVER WAND RF STERILE (DRAPES) ×1 IMPLANT
DERMABOND ADVANCED (GAUZE/BANDAGES/DRESSINGS) ×2
DERMABOND ADVANCED .7 DNX12 (GAUZE/BANDAGES/DRESSINGS) ×1 IMPLANT
DRAIN CHANNEL 19F RND (DRAIN) ×3 IMPLANT
DRAIN PENROSE 0.5X18 (DRAIN) IMPLANT
DRAPE INCISE IOBAN 66X45 STRL (DRAPES) ×2 IMPLANT
DRAPE LAPAROSCOPIC ABDOMINAL (DRAPES) ×2 IMPLANT
DRAPE WARM FLUID 44X44 (DRAPES) ×2 IMPLANT
DRSG COVADERM 4X10 (GAUZE/BANDAGES/DRESSINGS) IMPLANT
DRSG COVADERM 4X14 (GAUZE/BANDAGES/DRESSINGS) IMPLANT
DRSG COVADERM 4X6 (GAUZE/BANDAGES/DRESSINGS) IMPLANT
DRSG COVADERM 4X8 (GAUZE/BANDAGES/DRESSINGS) IMPLANT
DRSG COVADERM PLUS 2X2 (GAUZE/BANDAGES/DRESSINGS) IMPLANT
DRSG TEGADERM 4X4.75 (GAUZE/BANDAGES/DRESSINGS) ×2 IMPLANT
DRSG TELFA 3X8 NADH (GAUZE/BANDAGES/DRESSINGS) IMPLANT
ELECT BLADE 4.0 EZ CLEAN MEGAD (MISCELLANEOUS) ×2
ELECT BLADE 6.5 EXT (BLADE) ×2 IMPLANT
ELECT CAUTERY BLADE 6.4 (BLADE) ×2 IMPLANT
ELECT NDL BLADE 2-5/6 (NEEDLE) IMPLANT
ELECT NEEDLE BLADE 2-5/6 (NEEDLE) ×2 IMPLANT
ELECT PAD DSPR THERM+ ADLT (MISCELLANEOUS) ×2 IMPLANT
ELECT REM PT RETURN 9FT ADLT (ELECTROSURGICAL) ×2
ELECTRODE BLDE 4.0 EZ CLN MEGD (MISCELLANEOUS) ×1 IMPLANT
ELECTRODE REM PT RTRN 9FT ADLT (ELECTROSURGICAL) ×1 IMPLANT
EVACUATOR SILICONE 100CC (DRAIN) ×1 IMPLANT
GAUZE 4X4 16PLY RFD (DISPOSABLE) ×1 IMPLANT
GAUZE SPONGE 4X4 12PLY STRL (GAUZE/BANDAGES/DRESSINGS) IMPLANT
GEL ULTRASOUND 20GR AQUASONIC (MISCELLANEOUS) IMPLANT
GLOVE BIOGEL PI IND STRL 6 (GLOVE) ×2 IMPLANT
GLOVE BIOGEL PI INDICATOR 6 (GLOVE) ×1
GLOVE SURG SYN 5.5 (GLOVE) ×2 IMPLANT
GLOVE SURG SYN 5.5 PF PI (GLOVE) ×2 IMPLANT
GOWN STRL REUS W/ TWL LRG LVL3 (GOWN DISPOSABLE) ×5 IMPLANT
GOWN STRL REUS W/TWL LRG LVL3 (GOWN DISPOSABLE) ×10
HANDLE SUCTION POOLE (INSTRUMENTS) ×1 IMPLANT
HEMOSTAT SNOW SURGICEL 2X4 (HEMOSTASIS) ×1 IMPLANT
HEMOSTAT SURGICEL 2X14 (HEMOSTASIS) IMPLANT
KIT BASIN OR (CUSTOM PROCEDURE TRAY) ×2 IMPLANT
KIT TUBE JEJUNAL 16FR (CATHETERS) IMPLANT
KIT TURNOVER KIT B (KITS) ×2 IMPLANT
L-HOOK LAP DISP 36CM (ELECTROSURGICAL)
LHOOK LAP DISP 36CM (ELECTROSURGICAL) IMPLANT
LOOP VESSEL MAXI BLUE (MISCELLANEOUS) ×2 IMPLANT
LOOP VESSEL MINI RED (MISCELLANEOUS) ×2 IMPLANT
MARKER SKIN DUAL TIP RULER LAB (MISCELLANEOUS) ×2 IMPLANT
NS IRRIG 1000ML POUR BTL (IV SOLUTION) ×4 IMPLANT
PACK GENERAL/GYN (CUSTOM PROCEDURE TRAY) IMPLANT
PAD ARMBOARD 7.5X6 YLW CONV (MISCELLANEOUS) ×4 IMPLANT
PAD DRESSING TELFA 3X8 NADH (GAUZE/BANDAGES/DRESSINGS) IMPLANT
PENCIL SMOKE EVACUATOR (MISCELLANEOUS) ×2 IMPLANT
PLUG CATH AND CAP STER (CATHETERS) IMPLANT
PROBE LAPAROSCOPIC 5MM W/FTSWT (MISCELLANEOUS) ×1 IMPLANT
RELOAD PROXIMATE 75MM BLUE (ENDOMECHANICALS) ×2 IMPLANT
RELOAD PROXIMATE 75MM GREEN (ENDOMECHANICALS) IMPLANT
RELOAD STAPLE 75 3.8 BLU REG (ENDOMECHANICALS) ×2 IMPLANT
RELOAD STAPLE 75 4.5 GRN THCK (ENDOMECHANICALS) IMPLANT
RETRACTOR WND ALEXIS 25 LRG (MISCELLANEOUS) IMPLANT
RETRACTOR WOUND ALXS 34CM XLRG (MISCELLANEOUS) IMPLANT
RTRCTR WOUND ALEXIS 25CM LRG (MISCELLANEOUS)
RTRCTR WOUND ALEXIS 34CM XLRG (MISCELLANEOUS) ×2
SEPRAFILM PROCEDURAL PACK 3X5 (MISCELLANEOUS) IMPLANT
SHEARS FOC LG CVD HARMONIC 17C (MISCELLANEOUS) ×2 IMPLANT
SLEEVE SUCTION 125 (MISCELLANEOUS) IMPLANT
SLEEVE SUCTION CATH 165 (SLEEVE) IMPLANT
SPONGE INTESTINAL PEANUT (DISPOSABLE) IMPLANT
SPONGE LAP 18X18 RF (DISPOSABLE) ×8 IMPLANT
SPONGE SURGIFOAM ABS GEL 100 (HEMOSTASIS) IMPLANT
STAPLER PROXIMATE 75MM BLUE (STAPLE) ×2 IMPLANT
STAPLER VISISTAT 35W (STAPLE) ×1 IMPLANT
SUCTION POOLE HANDLE (INSTRUMENTS) ×2
SUT 5.0 PDS RB-1 (SUTURE)
SUT ETHILON 2 0 FS 18 (SUTURE) ×4 IMPLANT
SUT ETHILON 2 LR (SUTURE) IMPLANT
SUT MNCRL AB 4-0 PS2 18 (SUTURE) ×1 IMPLANT
SUT PDS AB 1 TP1 96 (SUTURE) ×4 IMPLANT
SUT PDS AB 3-0 SH 27 (SUTURE) IMPLANT
SUT PDS AB 4-0 RB1 27 (SUTURE) ×22 IMPLANT
SUT PDS II 5-0 RB-2 VIOLET (SUTURE) ×12 IMPLANT
SUT PDS PLUS AB 5-0 RB-1 (SUTURE) IMPLANT
SUT PROLENE 3 0 SH 48 (SUTURE) ×2 IMPLANT
SUT PROLENE 4 0 RB 1 (SUTURE) ×6
SUT PROLENE 4-0 RB1 .5 CRCL 36 (SUTURE) ×1 IMPLANT
SUT PROLENE 5 0 RB 1 DA (SUTURE) IMPLANT
SUT SILK 2 0 TIES 10X30 (SUTURE) ×4 IMPLANT
SUT SILK 2 0SH CR/8 30 (SUTURE) ×1 IMPLANT
SUT SILK 3 0 TIES 10X30 (SUTURE) ×2 IMPLANT
SUT SILK 3 0SH CR/8 30 (SUTURE) ×2 IMPLANT
SUT VIC AB 2-0 CT1 27 (SUTURE)
SUT VIC AB 2-0 CT1 TAPERPNT 27 (SUTURE) IMPLANT
SUT VIC AB 2-0 SH 18 (SUTURE) IMPLANT
SUT VIC AB 3-0 MH 27 (SUTURE) ×12 IMPLANT
SUT VIC AB 3-0 SH 18 (SUTURE) ×2 IMPLANT
SUT VIC AB 3-0 SH 27 (SUTURE) ×4
SUT VIC AB 3-0 SH 27X BRD (SUTURE) IMPLANT
SUT VICRYL AB 2 0 TIES (SUTURE) IMPLANT
TAPE UMBILICAL 1/8 X36 TWILL (MISCELLANEOUS) IMPLANT
TOWEL GREEN STERILE (TOWEL DISPOSABLE) ×2 IMPLANT
TOWEL GREEN STERILE FF (TOWEL DISPOSABLE) ×2 IMPLANT
TRAY FOLEY MTR SLVR 14FR STAT (SET/KITS/TRAYS/PACK) ×2 IMPLANT
TUBE FEEDING 8FR 16IN STR KANG (MISCELLANEOUS) IMPLANT
TUBE FEEDING ENTERAL 5FR 16IN (TUBING) ×1 IMPLANT

## 2020-07-05 NOTE — H&P (Signed)
Alyssa Armstrong is an 70 y.o. female.    HPI: Alyssa Armstrong is a 70 yo female who was referred with a newly-diagnosed pancreatic neuroendocrine tumor. She was evaluated for chest tightness by her PCP in February and underwent a CT PE, which incidentally showed a mass in the head of the pancreas. She subsequently had a dedicated CT abd/pelvis which confirmed a 2.7cm hyperenhancing mass in the head of the pancreas (scans were done at [facility]). She was then referred to [facility] and underwent an EGD/EUS by Dr. Rush Landmark on 3/31 which showed a 2.8cm mass in the head/uncinate process. There was no invasion of the SMA or celiac trunk. FNA showed neoplastic cells, and IHC was consistent with a neuroendocrine neoplasm. It was staged as a uT2N0.  The patient reports occasional cramping abdominal pain, as well as constipation. No diarrhea, nausea or vomiting. She reports some dyspnea on exertion but is able to complete her ADLs and ambulate moderate distances. She was started on albuterol for her shortness of breath and reports she uses it about once per week. She underwent a preop evaluation by cardiology with NM stress test, which showed no significant abnormalities. She presents today for surgery.   Chromogranin A: 62 CA19-9: 6  Past Medical History:  Diagnosis Date  . Anxiety   . Arthritis   . Bronchitis   . Chronic prescription benzodiazepine use   . Combined hyperlipidemia associated with type 2 diabetes mellitus ([place])   . Depression 18YRS AGO  . Diabetes mellitus without complication ([place])   . GERD (gastroesophageal reflux disease)   . Hypertension   . 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 Seasonal allergies   . Sensorineural hearing loss (SNHL) of both ears   . Vitamin D deficiency     Past Surgical History:  Procedure Laterality Date  . ABDOMINAL HYSTERECTOMY     PARTIAL, have ovaries  . BIOPSY  05/17/2020   Procedure: BIOPSY;   Surgeon: Rush Landmark Telford Nab., MD;  Location: [place];  Service: Gastroenterology;;  . Lillard Anes Left 2009  . ESOPHAGOGASTRODUODENOSCOPY (EGD) WITH PROPOFOL N/A 05/17/2020   Procedure: ESOPHAGOGASTRODUODENOSCOPY (EGD) WITH PROPOFOL;  Surgeon: Rush Landmark Telford Nab., MD;  Location: [place];  Service: Gastroenterology;  Laterality: N/A;  . EUS N/A 05/17/2020   Procedure: UPPER ENDOSCOPIC ULTRASOUND (EUS) RADIAL;  Surgeon: Rush Landmark Telford Nab., MD;  Location: [place];  Service: Gastroenterology;  Laterality: N/A;  . EYE SURGERY     bilateral cataract  . FINE NEEDLE ASPIRATION  05/17/2020   Procedure: FINE NEEDLE ASPIRATION (FNA) LINEAR;  Surgeon: Irving Copas., MD;  Location: [place];  Service: Gastroenterology;;  . ORIF ANKLE FRACTURE Bilateral 09/07/2019   Procedure: OPEN REDUCTION INTERNAL FIXATION (ORIF), RIGHT MALLEOLAR ANKLE FRACTURE;  Surgeon: Marybelle Killings, MD;  [facility];  Service: Orthopedics;  Laterality: Bilateral;  . ROTATOR CUFF REPAIR Right 2008  . TONSILLECTOMY     as a child    Family History  Problem Relation Age of Onset  . Diabetes Mother   . Heart disease Mother   . Diabetes Father   . Lung cancer Father   . Hyperlipidemia Brother   . Clotting disorder Sister   . Hypertension Sister   . Asthma Daughter    Social History:  reports that she quit smoking about 52 years ago. Her smoking use included cigarettes. She smoked 0.25 packs per day. She has never used smokeless tobacco. She reports current  alcohol use. She reports that she does not use drugs.  Allergies:  Allergies  Allergen Reactions  . Vicodin [Hydrocodone-Acetaminophen] Nausea And Vomiting and Other (See Comments)    severe  . Lisinopril Cough    Medications Prior to Admission  Medication Sig Dispense Refill  . acetaminophen (TYLENOL) 500 MG tablet Take 500 mg by mouth daily as needed.    Marland Kitchen albuterol (VENTOLIN HFA) 108 (90 Base) MCG/ACT inhaler Inhale 2  puffs into the lungs every 4 (four) hours as needed.    Marland Kitchen atorvastatin (LIPITOR) 40 MG tablet Take 40 mg by mouth daily.    . benzonatate (TESSALON) 100 MG capsule Take 100-200 mg by mouth 3 (three) times daily as needed.    . calcium-vitamin D (OSCAL WITH D) 500-200 MG-UNIT TABS tablet Take 1 tablet by mouth daily.    . cetirizine (ZYRTEC) 10 MG tablet Take 1 tablet (10 mg total) by mouth daily. (Patient taking differently: Take 10 mg by mouth daily as needed for allergies.) 30 tablet 3  . Cholecalciferol (VITAMIN D3) 25 MCG (1000 UT) CAPS Take 1 capsule by mouth daily.    . clonazePAM (KLONOPIN) 0.5 MG tablet Take 0.25-0.5 mg by mouth 2 (two) times daily as needed for anxiety.    Marland Kitchen ezetimibe (ZETIA) 10 MG tablet Take 10 mg by mouth daily.    . famotidine (PEPCID) 20 MG tablet Take 20 mg by mouth daily.    Marland Kitchen losartan (COZAAR) 50 MG tablet Take 50 mg by mouth daily.    . polyethylene glycol (MIRALAX / GLYCOLAX) packet Take 17 g by mouth once a week.    . sucralfate (CARAFATE) 1 g tablet Take 1 g by mouth 4 (four) times daily.    Marland Kitchen venlafaxine (EFFEXOR) 75 MG tablet Take 75 mg by mouth 2 (two) times daily.    Marland Kitchen buPROPion (WELLBUTRIN XL) 150 MG 24 hr tablet Take 150 mg by mouth daily.    Marland Kitchen glucose blood test strip OneTouch Ultra Blue Test Strip  USE AS DIRECTED TO CHECK BLOOD SUGAR 1 TIME(S)DAILY    . Lancets (ONETOUCH DELICA PLUS [id number]) MISC OneTouch Delica Plus Lancet 33 gauge  USE AS DIRECTED TO CHECK BLOOD SUGAR 1 TIME(S) DAILY      Results for orders placed or performed during the hospital encounter of 07/05/20 (from the past 48 hour(s))  Glucose, capillary     Status: Abnormal   Collection Time: 07/05/20  6:11 AM  Result Value Ref Range   Glucose-Capillary 133 (H) 70 - 99 mg/dL    Comment: Glucose reference range applies only to samples taken after fasting for at least 8 hours.   No results found.  Review of Systems  Constitutional: Negative for chills and fever.  HENT:  Negative for voice change.   Eyes: Negative for visual disturbance.  Respiratory: Negative for shortness of breath.   Cardiovascular: Negative for chest pain.  Gastrointestinal: Negative for abdominal pain.  Allergic/Immunologic: Negative for immunocompromised state.  Neurological: Negative for speech difficulty and weakness.  Psychiatric/Behavioral: Negative for agitation and confusion.    Blood pressure (!) 142/63, pulse 75, temperature 97.6 F (36.4 C), resp. rate 18, height 5\' 4"  (1.626 m), weight 91.9 kg, SpO2 98 %. Physical Exam Constitutional:      General: She is not in acute distress.    Appearance: Normal appearance.  HENT:     Head: Normocephalic and atraumatic.  Eyes:     General: No scleral icterus.    Conjunctiva/sclera: Conjunctivae normal.  Pulmonary:     Effort: Pulmonary effort is normal. No respiratory distress.  Abdominal:     General: There is no distension.     Palpations: Abdomen is soft.     Tenderness: There is no abdominal tenderness.  Musculoskeletal:        General: Normal range of motion.     Cervical back: Normal range of motion.  Skin:    General: Skin is warm and dry.  Neurological:     General: No focal deficit present.     Mental Status: She is alert and oriented to person, place, and time. Mental status is at baseline.  Psychiatric:        Mood and Affect: Mood normal.        Behavior: Behavior normal.        Thought Content: Thought content normal.      Assessment/Plan 70 yo female with pancreatic neuroendocrine tumor. Proceed to OR for [place]. Risks and benefits discussed, informed consent obtained. Type and cross completed, 2u PRBCs on hold. Plan for ICU admission postoperatively.  Dwan Bolt, MD 07/05/2020, 6:55 AM

## 2020-07-05 NOTE — Anesthesia Procedure Notes (Signed)
Epidural Patient location during procedure: holding area Start time: 07/05/2020 7:10 AM End time: 07/05/2020 7:25 AM  Staffing Anesthesiologist: Darral Dash, DO Performed: anesthesiologist   Preanesthetic Checklist Completed: patient identified, IV checked, site marked, risks and benefits discussed, surgical consent, monitors and equipment checked, pre-op evaluation and timeout performed  Epidural Patient position: sitting Prep: ChloraPrep Patient monitoring: heart rate, continuous pulse ox and blood pressure Approach: midline Location: thoracic (1-12) Injection technique: LOR saline  Needle:  Needle type: Tuohy  Needle gauge: 17 G Needle length: 9 cm Needle insertion depth: 7 cm Catheter type: closed end flexible Catheter size: 20 Guage Catheter at skin depth: 11 cm Test dose: negative and 1.5% lidocaine with Epi 1:200 K  Assessment Events: blood not aspirated, injection not painful, no injection resistance and no paresthesia  Additional Notes Patient identified. Risks/Benefits/Options discussed with patient including but not limited to bleeding, infection, nerve damage, paralysis, failed block, incomplete pain control, headache, blood pressure changes, nausea, vomiting, reactions to medications, itching and postpartum back pain. Confirmed with bedside nurse the patient's most recent platelet count. Confirmed with patient that they are not currently taking any anticoagulation, have any bleeding history or any family history of bleeding disorders. Patient expressed understanding and wished to proceed. All questions were answered. Sterile technique was used throughout the entire procedure. Please see nursing notes for vital signs. Test dose was given through epidural catheter and negative prior to continuing to dose epidural or start infusion. Warning signs of high block given to the patient including shortness of breath, tingling/numbness in hands, complete motor block, or  any concerning symptoms with instructions to call for help. Patient was given instructions on fall risk and not to get out of bed. All questions and concerns addressed with instructions to call with any issues or inadequate analgesia.    Reason for block:procedure for pain

## 2020-07-05 NOTE — Anesthesia Procedure Notes (Signed)
Arterial Line Insertion Start/End5/19/2022 7:05 AM, 07/05/2020 7:10 AM Performed by: Griffin Dakin, CRNA, CRNA  Patient location: Pre-op. Preanesthetic checklist: patient identified, IV checked, site marked, risks and benefits discussed, surgical consent, monitors and equipment checked, pre-op evaluation, timeout performed and anesthesia consent Lidocaine 1% used for infiltration Right, radial was placed Catheter size: 20 Fr Hand hygiene performed  and maximum sterile barriers used   Attempts: 1 Procedure performed without using ultrasound guided technique. Following insertion, dressing applied and Biopatch. Post procedure assessment: normal and unchanged  Patient tolerated the procedure well with no immediate complications.

## 2020-07-05 NOTE — Anesthesia Postprocedure Evaluation (Signed)
Anesthesia Post Note  Patient: Alyssa Armstrong  Procedure(s) Performed: WHIPPLE PROCEDURE (N/A Abdomen)     Patient location during evaluation: PACU Anesthesia Type: Epidural and General Level of consciousness: awake and alert Pain management: pain level controlled Vital Signs Assessment: post-procedure vital signs reviewed and stable Respiratory status: spontaneous breathing, nonlabored ventilation, respiratory function stable and patient connected to nasal cannula oxygen Cardiovascular status: blood pressure returned to baseline and stable Postop Assessment: no apparent nausea or vomiting Anesthetic complications: no   No complications documented.  Last Vitals:  Vitals:   07/05/20 1900 07/05/20 2000  BP: (!) 123/56   Pulse: 78   Resp: 19 14  Temp:  37.7 C  SpO2: 96% 96%    Last Pain:  Vitals:   07/05/20 2000  TempSrc: Oral  PainSc: 3                  Makynzie Dobesh P Kahne Helfand

## 2020-07-05 NOTE — Transfer of Care (Signed)
Immediate Anesthesia Transfer of Care Note  Patient: Alyssa Armstrong  Procedure(s) Performed: WHIPPLE PROCEDURE (N/A Abdomen)  Patient Location: PACU  Anesthesia Type:General and Epidural  Level of Consciousness: awake, alert , oriented and drowsy  Airway & Oxygen Therapy: Patient Spontanous Breathing and Patient connected to face mask oxygen  Post-op Assessment: Report given to RN and Post -op Vital signs reviewed and unstable, Anesthesiologist notified  Post vital signs: Reviewed and stable  Last Vitals:  Vitals Value Taken Time  BP 116/53 07/05/20 1331  Temp    Pulse 61 07/05/20 1337  Resp 19 07/05/20 1337  SpO2 97 % 07/05/20 1337  Vitals shown include unvalidated device data.  Last Pain:  Vitals:   07/05/20 0652  PainSc: 0-No pain         Complications: No complications documented.

## 2020-07-05 NOTE — Anesthesia Procedure Notes (Signed)
Procedure Name: Intubation Date/Time: 07/05/2020 7:49 AM Performed by: Griffin Dakin, CRNA Pre-anesthesia Checklist: Patient identified, Emergency Drugs available, Suction available and Patient being monitored Patient Re-evaluated:Patient Re-evaluated prior to induction Oxygen Delivery Method: Circle system utilized Preoxygenation: Pre-oxygenation with 100% oxygen Induction Type: IV induction Ventilation: Mask ventilation without difficulty and Oral airway inserted - appropriate to patient size Laryngoscope Size: Mac and 3 Grade View: Grade III Tube type: Oral Tube size: 7.0 mm Number of attempts: 1 Airway Equipment and Method: Stylet and Oral airway Placement Confirmation: ETT inserted through vocal cords under direct vision,  positive ETCO2 and breath sounds checked- equal and bilateral Secured at: 24 cm Tube secured with: Tape Dental Injury: Teeth and Oropharynx as per pre-operative assessment

## 2020-07-05 NOTE — Progress Notes (Signed)
Patient examined at bedside this evening. She was on low-dose neo in PACU which is now off, and she remains normotensive. Pain controlled with epidural running at 8, and dilaudid PCA. Good UOP, drains serosanguinous. Awake and mentating appropriately. NG output is bilious. Postop labs are unremarkable. Continue postop care in ICU.

## 2020-07-05 NOTE — Op Note (Addendum)
Date: 07/05/20  Patient: Alyssa Armstrong MRN: [id number]  Preoperative Diagnosis: Pancreatic neuroendocrine tumor Postoperative Diagnosis: Same  Procedure: Pylorus-preserving pancreaticoduodenectomy (Whipple procedure)  Surgeon: Michaelle Birks, MD Assistant: Stark Klein, MD  EBL: 350 mL  Anesthesia: General endotracheal  Specimens: 1. Gallbladder 2. Whipple specimen 3. Portal lymph nodes  Indications: Alyssa Armstrong is a 70 yo female who underwent a chest CT several months ago for workup of an episode of chest pain, which incidentally showed a 2.8cm mass in the head of the pancreas. EUS with FNA was consistent with a neuroendocrine neoplasm. Staging scans showed no evidence of metastatic disease. Surgical resection was recommended and the patient agreed to proceed with a Whipple.  Findings: Well-circumscribed mass in the head of the pancreas. No evidence of metastatic disease within the abdomen. Replaced right hepatic artery off the SMA, which was identified and preserved during the dissection. Common bile duct and pancreatic neck margins were negative for malignant cells on frozen analysis.  Procedure details: Informed consent was obtained in the preoperative area prior to the procedure. The patient was brought to the operating room and placed on the table in the supine position. General anesthesia was induced and appropriate lines and drains were placed for intraoperative monitoring. Perioperative antibiotics were administered per SCIP guidelines. The abdomen was prepped and draped in the usual sterile fashion. A pre-procedure timeout was taken verifying patient identity, surgical site and procedure to be performed.  An upper midline skin incision was made and the subcutaneous tissue was divided with cautery. The fascia was opened along the linea alba the peritoneal cavity was entered. The falciform ligament was taken off the abdominal wall, ligated with 2-0 silk ties and divided. The  liver was inspected and there were no signs of metastatic disease. No peritoneal nodules were present. An Allexis wound protector and Bookwalter fixed retractor were placed. The duodenum was widely kocherized to expose the IVC and aorta. There was a firm but smooth and well-circumscribed mass palpable within the head of the pancreas. The SMA was palpated and there was no adenopathy along the SMA. Next the gastrocolic omentum was divided with harmonic shears to open the lesser sac. The inferior border of the pancreas was identified and dissected out to expose the SMV. The gallbladder was taken off the liver in a dome down fashion using cautery. The cystic artery was divided with the harmonic, and the cystic duct was suture ligated and divided. The gallbladder was passed off the field and sent for routine pathology. Next the portal dissection was started. A pulse was palpable posterior to the common bile duct, consistent with a replaced right hepatic artery. The common bile duct was circumferentially dissected out, taking care to preserve the right hepatic artery, which was visualized during this dissection. The common bile duct was divided distal to the cystic duct insertion using cautery. The distal bile duct margin was sent for frozen section and was negative for malignant cells. The gastroduodenal artery was then circumferentially dissected out, and on occlusion a pulse remained in the proper hepatic artery. The GDA was doubly ligated with a 2-0 silk tie followed by a 4-0 prolene suture ligature and divided. The gastroepiploic vessels were then ligated with 2-0 silk suture adjacent to the pylorus. The proximal duodenum was transected just distal to the pylorus with a 23mm GIA blue load stapler.  The trunk of the gastroepiploic vein was dissected out at its insertion into the SMV, ligated with a 4-0 prolene suture and divided. A plane  was then created between the anterior surface of the SMV/portal vein and the neck  of the pancreas using gentle blunt dissection. The neck of the pancreas was partially divided using cautery, and the posterior portion was transected sharply with a scalpel. The pancreatic neck margin was sent for frozen section and was negative for malignant cells. A mesenteric window was created at a point on the jejunum about 30cm distal to the ligament of Treitz, and the bowel was transected at this point using a 26mm GIA blue load stapler. The jejunal mesentery was divided with Harmonic shears, and the ligament of Treitz was taken down with cautery. The proximal jejunum was then brought underneath the mesocolon into the RUQ. The uncinate process of the pancreas was then carefully dissected off the retroperitoneum and SMA. The uncinate and adjacent lymphatic tissue were taken off the SMA using harmonic shears. Larger branch vessels were clamped and ligated with 2-0 silk ties. The course of the replaced right hepatic artery was visualized during this entire dissection, and the artery was carefully preserved. Larger branch vessels were clamped and tied with 2-0 silk ties. The specimen was removed and oriented, and sent for routine pathology. At the completion of the dissection, a pulse was palpable in the SMA, replaced right hepatic artery, and left hepatic artery.   The abdomen was irrigated and no active bleeding was noted. For reconstruction the distal transected end of the jejunum was brought up through a window in the transverse mesocolon to the right of the middle colic vessels. The end of the jejunum was laid next to the transected neck of the pancreas. A pancreaticojejunostomy was created in [place]umgart fashion using 3-0 vicryl transpancreatic sutures to the jejunum, and 5-0 PDS interrupted duct-to-mucosal sutures. An end-to-side choledochojejunostomy was then created using interrupted 4-0 PDS. The pancreaticobiliary limb of jejunum was then tacked to the transverse colon mesentery using 3-0 silk suture. An  antecolic duodenojejunostomy was then created in 2 layers. A back layer of interrupted 3-0 silk Lembert sutures was placed between the stapled end of the duodenum and the jejunum. The duodenal staple line was then removed with cautery and an enterotomy was created on the jejunum. An inner layer of running 3-0 vicryl suture was placed in [place] fashion. An anterior row of 3-0 silk Lembert sutures was then placed to complete the anastomosis. Confirmation of the nasogastric tube tip within the stomach was confirmed. The defect at the ligament of Treitz was closed with 3-0 silk suture. Two19-Fr round JP drains were placed, one anterior to the pancreatic and biliary anastomoses, and one posterior to the anastomoses. These were secured to the skin with 2-0 nylon. A falciform ligament flap was mobilized and laid over the GDA stump. The retractors and wound protector were removed and the fascia was closed with running looped 1 PDS. Scarpa's fascia was closed with running 3-0 vicryl suture, and the skin was closed with a running subcuticular 4-0 monocryl suture. Dermabond was applied.  The patient tolerated the procedure with no apparent complications. All counts were correct x2 at the end of the procedure. The patient was extubated and taken to PACU in stable condition.  Michaelle Birks, MD 07/05/20 2:18 PM

## 2020-07-06 ENCOUNTER — Encounter (HOSPITAL_COMMUNITY): Payer: Self-pay | Admitting: Surgery

## 2020-07-06 LAB — COMPREHENSIVE METABOLIC PANEL
ALT: 62 U/L — ABNORMAL HIGH (ref 0–44)
AST: 65 U/L — ABNORMAL HIGH (ref 15–41)
Albumin: 3.1 g/dL — ABNORMAL LOW (ref 3.5–5.0)
Alkaline Phosphatase: 48 U/L (ref 38–126)
Anion gap: 5 (ref 5–15)
BUN: 8 mg/dL (ref 8–23)
CO2: 26 mmol/L (ref 22–32)
Calcium: 8.2 mg/dL — ABNORMAL LOW (ref 8.9–10.3)
Chloride: 109 mmol/L (ref 98–111)
Creatinine, Ser: 0.93 mg/dL (ref 0.44–1.00)
GFR, Estimated: >60 mL/min (ref >60)
Glucose, Bld: 154 mg/dL — ABNORMAL HIGH (ref 70–99)
Potassium: 3.5 mmol/L (ref 3.5–5.1)
Sodium: 140 mmol/L (ref 135–145)
Total Bilirubin: 0.6 mg/dL (ref 0.3–1.2)
Total Protein: 5.4 g/dL — ABNORMAL LOW (ref 6.5–8.1)

## 2020-07-06 LAB — GLUCOSE, CAPILLARY
Glucose-Capillary: 148 mg/dL — ABNORMAL HIGH (ref 70–99)
Glucose-Capillary: 131 mg/dL — ABNORMAL HIGH (ref 70–99)
Glucose-Capillary: 127 mg/dL — ABNORMAL HIGH (ref 70–99)
Glucose-Capillary: 115 mg/dL — ABNORMAL HIGH (ref 70–99)
Glucose-Capillary: 120 mg/dL — ABNORMAL HIGH (ref 70–99)
Glucose-Capillary: 150 mg/dL — ABNORMAL HIGH (ref 70–99)

## 2020-07-06 LAB — CBC
HCT: 31.2 % — ABNORMAL LOW (ref 36.0–46.0)
Hemoglobin: 10.4 g/dL — ABNORMAL LOW (ref 12.0–15.0)
MCH: 25.4 pg — ABNORMAL LOW (ref 26.0–34.0)
MCHC: 33.3 g/dL (ref 30.0–36.0)
MCV: 76.1 fL — ABNORMAL LOW (ref 80.0–100.0)
Platelets: 211 10*3/uL (ref 150–400)
RBC: 4.1 MIL/uL (ref 3.87–5.11)
RDW: 15.7 % — ABNORMAL HIGH (ref 11.5–15.5)
WBC: 15 10*3/uL — ABNORMAL HIGH (ref 4.0–10.5)
nRBC: 0 % (ref 0.0–0.2)

## 2020-07-06 MED ORDER — EZETIMIBE 10 MG PO TABS
10.0000 mg | ORAL_TABLET | Freq: Every day | ORAL | Status: DC
  Administered 2020-07-06 – 2020-07-12 (×7): 10 mg via ORAL
  Filled 2020-07-06 (×7): qty 1

## 2020-07-06 MED ORDER — DEXTROSE-NACL 5-0.45 % IV SOLN: INTRAVENOUS | Status: DC

## 2020-07-06 MED ORDER — KETOROLAC TROMETHAMINE 15 MG/ML IJ SOLN
15.0000 mg | Freq: Three times a day (TID) | INTRAMUSCULAR | Status: DC
  Administered 2020-07-06 – 2020-07-07 (×4): 15 mg via INTRAVENOUS
  Filled 2020-07-06 (×5): qty 1

## 2020-07-06 MED ORDER — ATORVASTATIN CALCIUM 40 MG PO TABS
40.0000 mg | ORAL_TABLET | Freq: Every day | ORAL | Status: DC
  Administered 2020-07-06 – 2020-07-12 (×7): 40 mg via ORAL
  Filled 2020-07-06 (×7): qty 1

## 2020-07-06 MED ORDER — ACETAMINOPHEN 325 MG PO TABS
650.0000 mg | ORAL_TABLET | Freq: Four times a day (QID) | ORAL | Status: DC
  Administered 2020-07-06 – 2020-07-12 (×23): 650 mg via ORAL
  Filled 2020-07-06 (×24): qty 2

## 2020-07-06 MED ORDER — VENLAFAXINE HCL 75 MG PO TABS
75.0000 mg | ORAL_TABLET | Freq: Two times a day (BID) | ORAL | Status: DC
  Administered 2020-07-06 – 2020-07-12 (×13): 75 mg via ORAL
  Filled 2020-07-06 (×3): qty 2
  Filled 2020-07-06: qty 1
  Filled 2020-07-06 (×8): qty 2

## 2020-07-06 NOTE — Progress Notes (Signed)
    1 Day Post-Op  Subjective: No acute issues overnight. Remains hemodynamically stable with no pressor requirement. Hgb stable at 10, bilirubin normal, labs unremarkable. Excellent UOP. Patient reports good pain control. Blood glc well-controlled.   Objective: Vital signs in last 24 hours: Temp:  [97.2 F (36.2 C)-99.9 F (37.7 C)] 99.3 F (37.4 C) (05/20 0400) Pulse Rate:  [58-96] 95 (05/20 0600) Resp:  [6-27] 13 (05/20 0600) BP: (85-140)/(43-66) 139/57 (05/20 0600) SpO2:  [93 %-100 %] 95 % (05/20 0600) Arterial Line BP: (75-145)/(31-57) 145/55 (05/19 1545) FiO2 (%):  [100 %] 100 % (05/19 1452)    Intake/Output from previous day: 05/19 0701 - 05/20 0700 In: 4038.8 [I.V.:3443.1; IV Piggyback:500] Out: 2580 [Urine:1825; Emesis/NG output:30; Drains:225; Blood:500] Intake/Output this shift: Total I/O In: 977 [I.V.:977] Out: 1017 [Urine:1100; Drains:95]  PE: General: resting comfortably, NAD Neuro: alert and oriented, no focal deficits HEENT: NG in place with minimal bilious drainage Resp: normal work of breathing CV: RRR Abdomen: soft, nondistended, appropriately tender. Midline incision c/d/i with no erythema, induration or drainage. RUQ JPx2 with serosanguinous drainage. Extremities: warm and well-perfused GU: foley in place draining clear light yellow urine  Lab Results:  Recent Labs    07/05/20 1325 07/06/20 0430  WBC 12.7* 15.0*  HGB 10.6* 10.4*  HCT 31.9* 31.2*  PLT 229 211   BMET Recent Labs    07/05/20 1325 07/06/20 0430  NA 138 140  K 4.0 3.5  CL 108 109  CO2 24 26  GLUCOSE 191* 154*  BUN 11 8  CREATININE 0.92 0.93  CALCIUM 8.2* 8.2*   PT/INR No results for input(s): LABPROT, INR in the last 72 hours. CMP     Component Value Date/Time   NA 140 07/06/2020 0430   K 3.5 07/06/2020 0430   CL 109 07/06/2020 0430   CO2 26 07/06/2020 0430   GLUCOSE 154 (H) 07/06/2020 0430   BUN 8 07/06/2020 0430   CREATININE 0.93 07/06/2020 0430    CREATININE 0.92 05/28/2020 1200   CALCIUM 8.2 (L) 07/06/2020 0430   PROT 5.4 (L) 07/06/2020 0430   ALBUMIN 3.1 (L) 07/06/2020 0430   AST 65 (H) 07/06/2020 0430   AST 24 05/28/2020 1200   ALT 62 (H) 07/06/2020 0430   ALT 38 05/28/2020 1200   ALKPHOS 48 07/06/2020 0430   BILITOT 0.6 07/06/2020 0430   BILITOT 0.3 05/28/2020 1200   GFRNONAA >60 07/06/2020 0430   GFRNONAA >60 05/28/2020 1200   GFRAA >60 09/08/2019 0300   Lipase     Component Value Date/Time   LIPASE 25 05/07/2013 0450         Assessment/Plan 70 yo female with neuroendocrine tumor of the head of the pancreas, POD1 s/p Whipple. - Remove NG tube, advance to clear liquid diet, no carbonated beverages - Maintenance IV fluids to 84 ml/hr - Sliding scale insulin q4h - Remove foley - Remove a-line - Pain control: PCA and epidural, begin scheduled toradol - Out of bed, ambulate, PT consulted - Aggressive pulmonary toilet and IS - Resume appropriate home medications - VTE: SQH, SCDs - Dispo: ICU   LOS: 1 day    Michaelle Birks, MD [facility] Surgery General, Hepatobiliary and Pancreatic Surgery 07/06/20 6:46 AM

## 2020-07-06 NOTE — Evaluation (Signed)
Physical Therapy Evaluation Patient Details Name: Alyssa Armstrong MRN: [id number] DOB: 1951/01/29 Today's Date: 07/06/2020   History of Present Illness  Pt is a 70 y.o. 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 It was staged as a uT2N0. PMH: bil ear sensorineural hearing loss, lymphocytosis, HTN, GERD, DM, depression, bornchitis, arthritis, chronic prescription benzodiazephine use, and anxiety.    Clinical Impression  Pt presents with condition above and deficits mentioned below, see PT Problem List. PTA, pt was independent with all finances, medication management, ADLs, driving, and mobility without AD, but needed supervision for stairs. Currently, pt is demonstrating cognitive deficits (possibly due to lethargy from meds), impaired balance, decreased AROM in trunk and legs, and decreased activity tolerance. Pt is currently at risk for falls. Pt needing min guard for transfers and bedroom distance mobility with a RW this date. Expect pt to improve well. At this time, pt would need 24/7 assistance at home for safety purposes, but hopefully will quickly progress to intermittent as her cognition and lethargy improve. Will continue to follow acutely. Recommend follow-up with HHPT to maximize pt independence and safety with all functional mobility and decrease her risk for falls.    Follow Up Recommendations Home health PT;Supervision/Assistance - 24 hour (initially and then decrease as cognition and mobility improve)    Equipment Recommendations  None recommended by PT (pt has necessary equipment)    Recommendations for Other Services       Precautions / Restrictions Precautions Precautions: Fall Precaution Comments: epidural cath, x2 R pleural drains Restrictions Weight Bearing Restrictions: No      Mobility  Bed Mobility               General bed mobility comments:  Pt sitting up in recliner upon arrival.    Transfers Overall transfer level: Needs assistance Equipment used: Rolling walker (2 wheeled) Transfers: Sit to/from Stand Sit to Stand: Min guard         General transfer comment: Min guard for safety but no overt LOB. Pt scooting anteriorly without cues to prepare for transfer but she questioned herself why she was scooting anteriorly.  Ambulation/Gait Ambulation/Gait assistance: Min guard;+2 safety/equipment Gait Distance (Feet): 20 Feet Assistive device: Rolling walker (2 wheeled) Gait Pattern/deviations: Step-through pattern;Decreased stride length;Trunk flexed Gait velocity: reduced Gait velocity interpretation: <1.31 ft/sec, indicative of household ambulator General Gait Details: Pt with slow, mildly unsteady gait with poor flexed posture. Pt needing cues to open eyes prior to ambulating. Min guard + 2 for safety, no overt LOB. Needs cues to keep RW on ground.  Stairs            Wheelchair Mobility    Modified Rankin (Stroke Patients Only)       Balance Overall balance assessment: Needs assistance Sitting-balance support: Feet supported;No upper extremity supported Sitting balance-Leahy Scale: Good Sitting balance - Comments: Pt able to cross ankle onto contralateral knee to donn/doff socks but pt needing extra time and cues to get sock initiated on toes. Pt with poor trunk flexion ROM due to surgery.   Standing balance support: Bilateral upper extremity supported;During functional activity Standing balance-Leahy Scale: Poor Standing balance comment: Reliant on bil UE support.                             Pertinent Vitals/Pain Pain Assessment: Faces Faces Pain Scale:  Hurts little more Pain Location: abdomen Pain Descriptors / Indicators: Discomfort;Grimacing;Guarding Pain Intervention(s): Limited activity within patient's tolerance;Monitored during session;Repositioned    Home Living Family/patient  expects to be discharged to:: Private residence Living Arrangements: Spouse/significant other Available Help at Discharge: Family;Available 24 hours/day Type of Home: House Home Access: Stairs to enter Entrance Stairs-Rails: Left (ascending) Entrance Stairs-Number of Steps: 2 Home Layout: One level Home Equipment: Walker - 2 wheels;Bedside commode;Shower seat - built in;Wheelchair - manual      Prior Function Level of Independence: Independent         Comments: Supervision for stairs but otherwise independent. Pt drives.     Hand Dominance        Extremity/Trunk Assessment   Upper Extremity Assessment Upper Extremity Assessment: Overall WFL for tasks assessed    Lower Extremity Assessment Lower Extremity Assessment: Overall WFL for tasks assessed (decreased AROM into hip flexion and external rotation and with R ankle dorsiflexion)    Cervical / Trunk Assessment Cervical / Trunk Assessment: Normal  Communication   Communication: No difficulties  Cognition Arousal/Alertness: Lethargic;Suspect due to medications Behavior During Therapy: [facility] for tasks assessed/performed Overall Cognitive Status: Impaired/Different from baseline Area of Impairment: Attention;Memory;Awareness;Problem solving;Following commands                   Current Attention Level: Sustained Memory: Decreased short-term memory Following Commands: Follows one step commands consistently;Follows one step commands with increased time;Follows multi-step commands with increased time   Awareness: Emergent Problem Solving: Slow processing;Difficulty sequencing;Requires verbal cues General Comments: Pt appears to be lethargic, possibly due to medications. Pt needing extra time to process and respond to questions and needing cues to open eyes for safety with some functional mobility. Pt needing occasional repetition of cues to remain on task and sequence to perform tasks.      General Comments       Exercises     Assessment/Plan    PT Assessment Patient needs continued PT services  PT Problem List Decreased range of motion;Decreased activity tolerance;Decreased balance;Decreased mobility;Decreased coordination;Decreased cognition;Decreased knowledge of use of DME;Pain;Decreased safety awareness       PT Treatment Interventions DME instruction;Gait training;Stair training;Functional mobility training;Therapeutic exercise;Therapeutic activities;Balance training;Neuromuscular re-education;Cognitive remediation;Patient/family education    PT Goals (Current goals can be found in the Care Plan section)  Acute Rehab PT Goals Patient Stated Goal: to go home PT Goal Formulation: With patient Time For Goal Achievement: 07/20/20 Potential to Achieve Goals: Good    Frequency Min 3X/week   Barriers to discharge        Co-evaluation               AM-PAC PT "6 Clicks" Mobility  Outcome Measure Help needed turning from your back to your side while in a flat bed without using bedrails?: A Little Help needed moving from lying on your back to sitting on the side of a flat bed without using bedrails?: A Little Help needed moving to and from a bed to a chair (including a wheelchair)?: A Little Help needed standing up from a chair using your arms (e.g., wheelchair or bedside chair)?: A Little Help needed to walk in hospital room?: A Little Help needed climbing 3-5 steps with a railing? : A Little 6 Click Score: 18    End of Session Equipment Utilized During Treatment: Gait belt Activity Tolerance: Patient tolerated treatment well;Patient limited by lethargy Patient left: in chair;with call bell/phone within reach;with chair alarm set Nurse Communication: Mobility status PT Visit  Diagnosis: Unsteadiness on feet (R26.81);Other abnormalities of gait and mobility (R26.89);Difficulty in walking, not elsewhere classified (R26.2);Pain Pain - part of body:  (abdomen)    Time:  5027-7412 PT Time Calculation (min) (ACUTE ONLY): 20 min   Charges:   PT Evaluation $PT Eval Moderate Complexity: 1 Mod          Moishe Spice, PT, DPT Acute Rehabilitation Services  Pager: [phone] Office: [phone]   Orvan Falconer 07/06/2020, 1:48 PM

## 2020-07-06 NOTE — Addendum Note (Signed)
Addendum  created 07/06/20 1354 by Merlinda Frederick, MD   Clinical Note Signed

## 2020-07-06 NOTE — Anesthesia Post-op Follow-up Note (Signed)
  Anesthesia Pain Follow-up Note  Patient: Alyssa Armstrong  Day #: POD 1   Date of Follow-up: 07/06/2020 Time: 1:52 PM  Last Vitals:  Vitals:   07/06/20 1226 07/06/20 1230  BP:  120/60  Pulse:  87  Resp: 18 20  Temp:    SpO2:  95%    Level of Consciousness: alert  Pain: mild   Side Effects:None  Catheter Site Exam:clean, dry, no drainage  Anti-Coag Meds (From admission, onward)   Start     Dose/Rate Route Frequency Ordered Stop   07/06/20 0600  heparin injection 5,000 Units        5,000 Units Subcutaneous Every 8 hours 07/05/20 1315      Epidural / Intrathecal (From admission, onward)   Start     Dose/Rate Route Frequency Ordered Stop   07/05/20 1800  ropivacaine (PF) 2 mg/mL (0.2%) (NAROPIN) injection        8 mL/hr 8 mL/hr  Epidural Continuous 07/05/20 1713         Plan: Continue current therapy of postop epidural at surgeon's request  Merlinda Frederick

## 2020-07-07 LAB — COMPREHENSIVE METABOLIC PANEL
ALT: 54 U/L — ABNORMAL HIGH (ref 0–44)
AST: 53 U/L — ABNORMAL HIGH (ref 15–41)
Albumin: 2.6 g/dL — ABNORMAL LOW (ref 3.5–5.0)
Alkaline Phosphatase: 47 U/L (ref 38–126)
Anion gap: 6 (ref 5–15)
BUN: 10 mg/dL (ref 8–23)
CO2: 26 mmol/L (ref 22–32)
Calcium: 7.6 mg/dL — ABNORMAL LOW (ref 8.9–10.3)
Chloride: 105 mmol/L (ref 98–111)
Creatinine, Ser: 1.33 mg/dL — ABNORMAL HIGH (ref 0.44–1.00)
GFR, Estimated: 43 mL/min — ABNORMAL LOW (ref >60)
Glucose, Bld: 126 mg/dL — ABNORMAL HIGH (ref 70–99)
Potassium: 3.4 mmol/L — ABNORMAL LOW (ref 3.5–5.1)
Sodium: 137 mmol/L (ref 135–145)
Total Bilirubin: 0.7 mg/dL (ref 0.3–1.2)
Total Protein: 5.1 g/dL — ABNORMAL LOW (ref 6.5–8.1)

## 2020-07-07 LAB — CBC
HCT: 26.2 % — ABNORMAL LOW (ref 36.0–46.0)
Hemoglobin: 8.9 g/dL — ABNORMAL LOW (ref 12.0–15.0)
MCH: 25.8 pg — ABNORMAL LOW (ref 26.0–34.0)
MCHC: 34 g/dL (ref 30.0–36.0)
MCV: 75.9 fL — ABNORMAL LOW (ref 80.0–100.0)
Platelets: 164 10*3/uL (ref 150–400)
RBC: 3.45 MIL/uL — ABNORMAL LOW (ref 3.87–5.11)
RDW: 15.9 % — ABNORMAL HIGH (ref 11.5–15.5)
WBC: 12.3 10*3/uL — ABNORMAL HIGH (ref 4.0–10.5)
nRBC: 0 % (ref 0.0–0.2)

## 2020-07-07 LAB — GLUCOSE, CAPILLARY
Glucose-Capillary: 104 mg/dL — ABNORMAL HIGH (ref 70–99)
Glucose-Capillary: 124 mg/dL — ABNORMAL HIGH (ref 70–99)
Glucose-Capillary: 109 mg/dL — ABNORMAL HIGH (ref 70–99)
Glucose-Capillary: 126 mg/dL — ABNORMAL HIGH (ref 70–99)
Glucose-Capillary: 93 mg/dL (ref 70–99)

## 2020-07-07 MED ORDER — HYDROMORPHONE HCL 1 MG/ML IJ SOLN
0.5000 mg | INTRAMUSCULAR | Status: DC | PRN
  Administered 2020-07-07 – 2020-07-09 (×9): 0.5 mg via INTRAVENOUS
  Filled 2020-07-07 (×9): qty 1

## 2020-07-07 NOTE — Progress Notes (Signed)
Patient transferred to 4N11. Patient with no complaints at the current time.

## 2020-07-07 NOTE — Anesthesia Post-op Follow-up Note (Signed)
  Anesthesia Pain Follow-up Note  Patient: Alyssa Armstrong  Day #: 2  Date of Follow-up: 07/07/2020 Time: 11:28 AM  Last Vitals:  Vitals:   07/07/20 0821 07/07/20 0940  BP:    Pulse:    Resp: (!) 21 (!) 30  Temp:    SpO2: 92% 92%    Level of Consciousness: alert  Pain: mild   Side Effects:None  Catheter Site Exam:clean  Anti-Coag Meds (From admission, onward)   Start     Dose/Rate Route Frequency Ordered Stop   07/06/20 0600  heparin injection 5,000 Units        5,000 Units Subcutaneous Every 8 hours 07/05/20 1315      Epidural / Intrathecal (From admission, onward)   Start     Dose/Rate Route Frequency Ordered Stop   07/05/20 1800  ropivacaine (PF) 2 mg/mL (0.2%) (NAROPIN) injection        8 mL/hr 8 mL/hr  Epidural Continuous 07/05/20 1713       Area c/d/i. Infusion continues at 8cc/hr. PCA supplementation with minimal utilization (0.6mg  over past 24 hours).   Plan: Continue current therapy of postop epidural at surgeon's request  [place]

## 2020-07-07 NOTE — Progress Notes (Signed)
    2 Days Post-Op  Subjective: Foley replaced overnight for urinary retention. Afebrile and hemodynamically stable. No flatus yet. Patient endorses an episode of nausea yesterday but no vomiting. Tolerating clear liquids. Nausea resolved this morning. Patient was out of bed yesterday and worked with PT. Labs pending this morning.   Objective: Vital signs in last 24 hours: Temp:  [98.6 F (37 C)-99.7 F (37.6 C)] 99.3 F (37.4 C) (05/21 0400) Pulse Rate:  [74-94] 92 (05/21 0600) Resp:  [11-44] 35 (05/21 0600) BP: (102-131)/(38-67) 118/52 (05/21 0500) SpO2:  [90 %-97 %] 93 % (05/21 0600)    Intake/Output from previous day: 05/20 0701 - 05/21 0700 In: 2718.3 [P.O.:720; I.V.:1718.9] Out: 1475 [Urine:1350; Drains:125] Intake/Output this shift: Total I/O In: 1051.2 [I.V.:955.2; Other:96] Out: 49 [Urine:850; Drains:45]  PE: General: resting comfortably, NAD Neuro: alert and oriented, no focal deficits Resp: normal work of breathing on room air CV: RRR Abdomen: soft, nondistended, appropriately tender. Midline incision c/d/i with no erythema, induration or drainage. RUQ JPx2 with serosanguinous drainage, anterior drain is darker in color but nonbilious. Extremities: warm and well-perfused GU: foley in place draining clear light yellow urine  Lab Results:  Recent Labs    07/05/20 1325 07/06/20 0430  WBC 12.7* 15.0*  HGB 10.6* 10.4*  HCT 31.9* 31.2*  PLT 229 211   BMET Recent Labs    07/05/20 1325 07/06/20 0430  NA 138 140  K 4.0 3.5  CL 108 109  CO2 24 26  GLUCOSE 191* 154*  BUN 11 8  CREATININE 0.92 0.93  CALCIUM 8.2* 8.2*   PT/INR No results for input(s): LABPROT, INR in the last 72 hours. CMP     Component Value Date/Time   NA 140 07/06/2020 0430   K 3.5 07/06/2020 0430   CL 109 07/06/2020 0430   CO2 26 07/06/2020 0430   GLUCOSE 154 (H) 07/06/2020 0430   BUN 8 07/06/2020 0430   CREATININE 0.93 07/06/2020 0430   CREATININE 0.92 05/28/2020 1200    CALCIUM 8.2 (L) 07/06/2020 0430   PROT 5.4 (L) 07/06/2020 0430   ALBUMIN 3.1 (L) 07/06/2020 0430   AST 65 (H) 07/06/2020 0430   AST 24 05/28/2020 1200   ALT 62 (H) 07/06/2020 0430   ALT 38 05/28/2020 1200   ALKPHOS 48 07/06/2020 0430   BILITOT 0.6 07/06/2020 0430   BILITOT 0.3 05/28/2020 1200   GFRNONAA >60 07/06/2020 0430   GFRNONAA >60 05/28/2020 1200   GFRAA >60 09/08/2019 0300   Lipase     Component Value Date/Time   LIPASE 25 05/07/2013 0450         Assessment/Plan 70 yo female with neuroendocrine tumor of the head of the pancreas, POD2 s/p Whipple. - Advance to full liquid diet - Sliding scale insulin q4h - Urinary retention: keep foley in place until epidural is out - Pain control: PCA and epidural, scheduled toradol - Out of bed, ambulate, PT consulted - Aggressive pulmonary toilet and IS - Daily PPI - VTE: SQH, SCDs - Dispo: transfer to progressive care (4NP)   LOS: 2 days    Michaelle Birks, MD [facility] Surgery General, Hepatobiliary and Pancreatic Surgery 07/07/20 6:41 AM

## 2020-07-07 NOTE — Progress Notes (Signed)
Physical Therapy Treatment Patient Details Name: Alyssa Armstrong MRN: [id number] DOB: Jul 31, 1950 Today's Date: 07/07/2020    History of Present Illness Pt is a 70 y.o. 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 It was staged as a uT2N0. PMH: bil ear sensorineural hearing loss, lymphocytosis, HTN, GERD, DM, depression, bornchitis, arthritis, chronic prescription benzodiazephine use, and anxiety.    PT Comments    Pt making good progress towards her PT goals, ambulating an increased distance of up to ~ 100 ft with min guard assist utilizing a RW. Pt continues to ambulate at a very slow pace with short steps, indicating she is at risk for falls. Pt needs cues to sequence RW management for safety also. She displays overall improved cognition this date though with increased processing speed, arousal, and safety awareness. However, she did repeat some statements a couple times during session, indicating possible STM deficits. Pt needing minA to sit up EOB with HOB elevated due to abdominal pain. Will continue to follow acutely. Current recommendations remain appropriate.   Follow Up Recommendations  Home health PT;Supervision/Assistance - 24 hour (initially and then decrease as improves)     Equipment Recommendations  None recommended by PT (pt has necessary equipment)    Recommendations for Other Services       Precautions / Restrictions Precautions Precautions: Fall Precaution Comments: epidural cath, x2 R pleural drains Restrictions Weight Bearing Restrictions: No    Mobility  Bed Mobility Overal bed mobility: Needs Assistance Bed Mobility: Rolling;Sidelying to Sit Rolling: Min guard Sidelying to sit: Min assist;HOB elevated       General bed mobility comments: Pt initially using bed controls and rails to manage legs towards EOB, cued pt to log roll and push up from  side to sit up to avoid abdominal muscle usage and thus pain, success but minA to ascend trunk with HOB elevated due to pain.    Transfers Overall transfer level: Needs assistance Equipment used: Rolling walker (2 wheeled) Transfers: Sit to/from Stand Sit to Stand: Min guard         General transfer comment: Min guard for safety but no overt LOB. Pt scooting anteriorly without cues to prepare for transfer, needing cues for hand placement.  Ambulation/Gait Ambulation/Gait assistance: Min guard Gait Distance (Feet): 100 Feet Assistive device: Rolling walker (2 wheeled) Gait Pattern/deviations: Step-through pattern;Decreased stride length;Trunk flexed Gait velocity: reduced Gait velocity interpretation: <1.31 ft/sec, indicative of household ambulator General Gait Details: Pt with slow, short steps, needing cues to direct RW, keep it on ground, and manage with turns. Min guard for safety, no overt LOB.   Stairs             Wheelchair Mobility    Modified Rankin (Stroke Patients Only)       Balance Overall balance assessment: Needs assistance Sitting-balance support: Feet supported;No upper extremity supported Sitting balance-Leahy Scale: Good     Standing balance support: Bilateral upper extremity supported;During functional activity Standing balance-Leahy Scale: Poor Standing balance comment: Reliant on bil UE support.                            Cognition Arousal/Alertness: Awake/alert Behavior During Therapy: WFL for tasks assessed/performed Overall Cognitive Status: Impaired/Different from baseline Area of Impairment: Memory;Problem solving  Memory: Decreased short-term memory       Problem Solving: Slow processing;Difficulty sequencing;Requires verbal cues General Comments: Pt more awake and quicker to respond throughout session. Pt still needing cues to sequence transfers and mobility with RW. Pt repeating several  questions during session: "I did good?"      Exercises      General Comments General comments (skin integrity, edema, etc.): VSS on RA      Pertinent Vitals/Pain Pain Assessment: Faces Faces Pain Scale: Hurts little more Pain Location: abdomen Pain Descriptors / Indicators: Discomfort;Grimacing;Guarding Pain Intervention(s): Limited activity within patient's tolerance;Monitored during session;Repositioned (pt pushed button for her pain meds 1x at end of session)    Home Living                      Prior Function            PT Goals (current goals can now be found in the care plan section) Acute Rehab PT Goals Patient Stated Goal: to improve PT Goal Formulation: With patient Time For Goal Achievement: 07/20/20 Potential to Achieve Goals: Good Progress towards PT goals: Progressing toward goals    Frequency    Min 3X/week      PT Plan Current plan remains appropriate    Co-evaluation              AM-PAC PT "6 Clicks" Mobility   Outcome Measure  Help needed turning from your back to your side while in a flat bed without using bedrails?: A Little Help needed moving from lying on your back to sitting on the side of a flat bed without using bedrails?: A Little Help needed moving to and from a bed to a chair (including a wheelchair)?: A Little Help needed standing up from a chair using your arms (e.g., wheelchair or bedside chair)?: A Little Help needed to walk in hospital room?: A Little Help needed climbing 3-5 steps with a railing? : A Little 6 Click Score: 18    End of Session Equipment Utilized During Treatment: Gait belt Activity Tolerance: Patient tolerated treatment well Patient left: in chair;with call bell/phone within reach;with chair alarm set   PT Visit Diagnosis: Unsteadiness on feet (R26.81);Other abnormalities of gait and mobility (R26.89);Difficulty in walking, not elsewhere classified (R26.2);Pain Pain - part of body:  (abdomen)      Time: 1109-1140 PT Time Calculation (min) (ACUTE ONLY): 31 min  Charges:  $Gait Training: 8-22 mins $Therapeutic Activity: 8-22 mins                     Moishe Spice, PT, DPT Acute Rehabilitation Services  Pager: [phone] Office: [place] 07/07/2020, 11:48 AM

## 2020-07-07 NOTE — Addendum Note (Signed)
Addendum  created 07/07/20 1132 by Darral Dash, DO   Clinical Note Signed

## 2020-07-07 NOTE — Progress Notes (Signed)
PCA discontinued from patient per order, patient made aware, and also aware of alternate pain medications available PRN. Pain level controled at this time and patient resting. Wasted 24 total mL of PCA (syringe and tube) in appropriate receptical. Wasted with Patent attorney.

## 2020-07-08 LAB — CBC
HCT: 24.9 % — ABNORMAL LOW (ref 36.0–46.0)
Hemoglobin: 8.4 g/dL — ABNORMAL LOW (ref 12.0–15.0)
MCH: 25.8 pg — ABNORMAL LOW (ref 26.0–34.0)
MCHC: 33.7 g/dL (ref 30.0–36.0)
MCV: 76.6 fL — ABNORMAL LOW (ref 80.0–100.0)
Platelets: 189 10*3/uL (ref 150–400)
RBC: 3.25 MIL/uL — ABNORMAL LOW (ref 3.87–5.11)
RDW: 15.6 % — ABNORMAL HIGH (ref 11.5–15.5)
WBC: 12.3 10*3/uL — ABNORMAL HIGH (ref 4.0–10.5)
nRBC: 0 % (ref 0.0–0.2)

## 2020-07-08 LAB — AMYLASE, PLEURAL OR PERITONEAL FLUID
Amylase, Fluid: 746 U/L
Amylase, Fluid: 3324 U/L

## 2020-07-08 LAB — GLUCOSE, CAPILLARY
Glucose-Capillary: 107 mg/dL — ABNORMAL HIGH (ref 70–99)
Glucose-Capillary: 109 mg/dL — ABNORMAL HIGH (ref 70–99)
Glucose-Capillary: 111 mg/dL — ABNORMAL HIGH (ref 70–99)
Glucose-Capillary: 115 mg/dL — ABNORMAL HIGH (ref 70–99)
Glucose-Capillary: 126 mg/dL — ABNORMAL HIGH (ref 70–99)
Glucose-Capillary: 128 mg/dL — ABNORMAL HIGH (ref 70–99)

## 2020-07-08 LAB — COMPREHENSIVE METABOLIC PANEL
ALT: 40 U/L (ref 0–44)
AST: 36 U/L (ref 15–41)
Albumin: 2.4 g/dL — ABNORMAL LOW (ref 3.5–5.0)
Alkaline Phosphatase: 61 U/L (ref 38–126)
Anion gap: 7 (ref 5–15)
BUN: 9 mg/dL (ref 8–23)
CO2: 28 mmol/L (ref 22–32)
Calcium: 7.8 mg/dL — ABNORMAL LOW (ref 8.9–10.3)
Chloride: 103 mmol/L (ref 98–111)
Creatinine, Ser: 1.1 mg/dL — ABNORMAL HIGH (ref 0.44–1.00)
GFR, Estimated: 54 mL/min — ABNORMAL LOW (ref >60)
Glucose, Bld: 119 mg/dL — ABNORMAL HIGH (ref 70–99)
Potassium: 3.1 mmol/L — ABNORMAL LOW (ref 3.5–5.1)
Sodium: 138 mmol/L (ref 135–145)
Total Bilirubin: 0.3 mg/dL (ref 0.3–1.2)
Total Protein: 5.6 g/dL — ABNORMAL LOW (ref 6.5–8.1)

## 2020-07-08 MED ORDER — POTASSIUM CHLORIDE CRYS ER 20 MEQ PO TBCR
40.0000 meq | EXTENDED_RELEASE_TABLET | Freq: Two times a day (BID) | ORAL | Status: AC
  Administered 2020-07-08 (×2): 40 meq via ORAL
  Filled 2020-07-08 (×2): qty 2

## 2020-07-08 NOTE — Evaluation (Signed)
Occupational Therapy Evaluation Patient Details Name: Alyssa Armstrong MRN: [id number] DOB: 1950/06/22 Today's Date: 07/08/2020    History of Present Illness Pt is a 70 y.o. 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 It was staged as a uT2N0. PMH: bil ear sensorineural hearing loss, lymphocytosis, HTN, GERD, DM, depression, bornchitis, arthritis, chronic prescription benzodiazephine use, and anxiety.   Clinical Impression   Pt PTA: Pt living with family and reports near independence prior. Pt currently, Pt limited by decreased strength, decreased ability to care for self,  increased need for assist and pain. Pt set-upA to minA for ADL; minA  For bed mobility and minguardA for transfers to take steps to [facility]. Pt wanting to rest so pt returned back to bed. Pt able to perform LB ADL with figure 4 technique so AE education provided anyway due to abdominal sx. Pt would benefit from continued OT skilled services. OT following acutely.    Follow Up Recommendations  No OT follow up    Equipment Recommendations  None recommended by OT    Recommendations for Other Services       Precautions / Restrictions Precautions Precautions: Fall Precaution Comments: epidural cath, x2 R pleural drains Restrictions Weight Bearing Restrictions: No      Mobility Bed Mobility Overal bed mobility: Needs Assistance Bed Mobility: Rolling;Sidelying to Sit;Sit to Supine Rolling: Min guard Sidelying to sit: Min assist;HOB elevated   Sit to supine: Min assist   General bed mobility comments: Pt using rails and reports to have them at home    Transfers Overall transfer level: Needs assistance Equipment used: Rolling walker (2 wheeled) Transfers: Sit to/from Stand Sit to Stand: Min guard         General transfer comment: Pt taking steps to [facility]    Balance Overall balance assessment:  Needs assistance Sitting-balance support: Feet supported;No upper extremity supported Sitting balance-Leahy Scale: Good     Standing balance support: Bilateral upper extremity supported;During functional activity Standing balance-Leahy Scale: Poor Standing balance comment: Reliant on single UE support.                           ADL either performed or assessed with clinical judgement   ADL Overall ADL's : Needs assistance/impaired Eating/Feeding: Modified independent;Sitting   Grooming: Set up;Sitting   Upper Body Bathing: Minimal assistance;Sitting   Lower Body Bathing: Minimal assistance;Sit to/from stand;Sitting/lateral leans;Cueing for safety Lower Body Bathing Details (indicate cue type and reason): Pt using figure 4 technique for LB ADL Upper Body Dressing : Minimal assistance;Sitting   Lower Body Dressing: Minimal assistance;Sitting/lateral leans;Sit to/from stand Lower Body Dressing Details (indicate cue type and reason): figure 4 Toilet Transfer: Min guard;Stand-pivot;Squat-pivot;Cueing for safety   Toileting- Clothing Manipulation and Hygiene: Minimal assistance;Cueing for safety;Sitting/lateral lean;Sit to/from stand       Functional mobility during ADLs: Minimal assistance;Rolling walker;Cueing for safety General ADL Comments: Pt limited by decreased strength, decreased ability to care for self,  increased need for assist and pain.     Vision Baseline Vision/History: No visual deficits Patient Visual Report: No change from baseline Vision Assessment?: No apparent visual deficits     Perception     Praxis      Pertinent Vitals/Pain Pain Assessment: Faces Faces Pain Scale: Hurts little more Pain Location: abdomen Pain Descriptors / Indicators: Discomfort;Grimacing;Guarding Pain Intervention(s): Limited activity within patient's tolerance;Monitored  during session     Hand Dominance Right   Extremity/Trunk Assessment Upper Extremity  Assessment Upper Extremity Assessment: Overall WFL for tasks assessed   Lower Extremity Assessment Lower Extremity Assessment: Overall WFL for tasks assessed;Defer to PT evaluation   Cervical / Trunk Assessment Cervical / Trunk Assessment: Normal   Communication Communication Communication: No difficulties   Cognition Arousal/Alertness: Awake/alert Behavior During Therapy: WFL for tasks assessed/performed Overall Cognitive Status: Within Functional Limits for tasks assessed                                 General Comments: Pt A/O and aware of need for therapy   General Comments  VSS on RA    Exercises     Shoulder Instructions      Home Living Family/patient expects to be discharged to:: Private residence Living Arrangements: Spouse/significant other Available Help at Discharge: Family;Available 24 hours/day Type of Home: House Home Access: Stairs to enter [employer] of Steps: 2 Entrance Stairs-Rails: Left Home Layout: One level     Bathroom Shower/Tub: Occupational psychologist: Handicapped height Bathroom Accessibility: Yes   Home Equipment: Environmental consultant - 2 wheels;Bedside commode;Shower seat - built in;Wheelchair - manual          Prior Functioning/Environment Level of Independence: Independent        Comments: Supervision for stairs but otherwise independent. Pt drives.        OT Problem List: Decreased strength;Decreased activity tolerance;Impaired vision/perception;Decreased safety awareness;Pain      OT Treatment/Interventions: Self-care/ADL training;Therapeutic exercise;Energy conservation;DME and/or AE instruction;Therapeutic activities;Patient/family education;Balance training    OT Goals(Current goals can be found in the care plan section) Acute Rehab OT Goals Patient Stated Goal: to improve OT Goal Formulation: With patient Time For Goal Achievement: 07/22/20 Potential to Achieve Goals: Good ADL Goals Pt Will  Perform Grooming: with supervision;standing Pt Will Transfer to Toilet: with supervision;ambulating;regular height toilet Additional ADL Goal #1: Pt will perform OOB ADL x6 mins with supervisionA in order to increase activity tolerance. Additional ADL Goal #2: Pt will perform ADL functional transfers with supervisionA.  OT Frequency: Min 2X/week   Barriers to D/C:            Co-evaluation              AM-PAC OT "6 Clicks" Daily Activity     Outcome Measure Help from another person eating meals?: None Help from another person taking care of personal grooming?: A Little Help from another person toileting, which includes using toliet, bedpan, or urinal?: A Little Help from another person bathing (including washing, rinsing, drying)?: A Little Help from another person to put on and taking off regular upper body clothing?: A Little Help from another person to put on and taking off regular lower body clothing?: A Little 6 Click Score: 19   End of Session Equipment Utilized During Treatment: Gait belt Nurse Communication: Mobility status  Activity Tolerance: Patient limited by pain;Patient tolerated treatment well Patient left: in bed;with call bell/phone within reach;with bed alarm set  OT Visit Diagnosis: Unsteadiness on feet (R26.81);Muscle weakness (generalized) (M62.81);Pain                Time: 1035-1055 OT Time Calculation (min): 20 min Charges:  OT General Charges $OT Visit: 1 Visit OT Evaluation $OT Eval Moderate Complexity: 1 Mod  Jefferey Pica, OTR/L Acute Rehabilitation Services Pager: [phone] Office: [place] 07/08/2020,  2:36 PM

## 2020-07-08 NOTE — Progress Notes (Signed)
    3 Days Post-Op  Subjective: Patient endorses some nausea and belching. No flatus. Hemodynamically stable. WBC 12. LFTs normal. Creatinine downtrending. No drain output charted for last 24 hours. Drain amylases are elevated. Did well with PT yesterday, ambulated in hallways.   Objective: Vital signs in last 24 hours: Temp:  [98.8 F (37.1 C)-100.1 F (37.8 C)] 99.1 F (37.3 C) (05/22 0703) Pulse Rate:  [75-90] 77 (05/22 0703) Resp:  [14-20] 15 (05/22 0703) BP: (102-170)/(54-124) 121/60 (05/22 0703) SpO2:  [90 %-98 %] 91 % (05/22 0703) FiO2 (%):  [21 %-28 %] 28 % (05/21 1500) Last BM Date:  (PTA)  Intake/Output from previous day: 05/21 0701 - 05/22 0700 In: 685.8 [P.O.:120; I.V.:480.6] Out: 1900 [Urine:1900] Intake/Output this shift: No intake/output data recorded.  PE: General: resting comfortably, NAD Neuro: alert and oriented, no focal deficits Resp: normal work of breathing on room air CV: RRR Abdomen: soft, nondistended, appropriately tender. Midline incision c/d/i with no erythema, induration or drainage. RUQ JPx2 with serosanguinous drainage, anterior drain is darker in color but nonbilious. Extremities: warm and well-perfused GU: foley in place draining clear light yellow urine  Lab Results:  Recent Labs    07/07/20 0558 07/08/20 0301  WBC 12.3* 12.3*  HGB 8.9* 8.4*  HCT 26.2* 24.9*  PLT 164 189   BMET Recent Labs    07/07/20 0558 07/08/20 0301  NA 137 138  K 3.4* 3.1*  CL 105 103  CO2 26 28  GLUCOSE 126* 119*  BUN 10 9  CREATININE 1.33* 1.10*  CALCIUM 7.6* 7.8*   PT/INR No results for input(s): LABPROT, INR in the last 72 hours. CMP     Component Value Date/Time   NA 138 07/08/2020 0301   K 3.1 (L) 07/08/2020 0301   CL 103 07/08/2020 0301   CO2 28 07/08/2020 0301   GLUCOSE 119 (H) 07/08/2020 0301   BUN 9 07/08/2020 0301   CREATININE 1.10 (H) 07/08/2020 0301   CREATININE 0.92 05/28/2020 1200   CALCIUM 7.8 (L) 07/08/2020 0301   PROT  5.6 (L) 07/08/2020 0301   ALBUMIN 2.4 (L) 07/08/2020 0301   AST 36 07/08/2020 0301   AST 24 05/28/2020 1200   ALT 40 07/08/2020 0301   ALT 38 05/28/2020 1200   ALKPHOS 61 07/08/2020 0301   BILITOT 0.3 07/08/2020 0301   BILITOT 0.3 05/28/2020 1200   GFRNONAA 54 (L) 07/08/2020 0301   GFRNONAA >60 05/28/2020 1200   GFRAA >60 09/08/2019 0300   Lipase     Component Value Date/Time   LIPASE 25 05/07/2013 0450         Assessment/Plan 70 yo female with 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 - Continue liquid diet, likely postop ileus, do not advance diet. Maintenance IV fluids. - Sliding scale insulin q4h - Urinary retention: keep foley in place until epidural is out - Pain control: epidural, prn dilaudid. PCA discontinued yesterday. Toradol stopped due to mild increase in creatinine. - Out of bed, ambulate, PT consulted - Aggressive pulmonary toilet and IS - Daily PPI - Pancreatic leak: keep drains in place. Discussed with nursing that drain output needs to be charted every shift. - VTE: SQH, SCDs - Dispo: progressive care   LOS: 3 days    Michaelle Birks, MD [facility] Surgery General, Hepatobiliary and Pancreatic Surgery 07/08/20 9:40 AM

## 2020-07-08 NOTE — Anesthesia Post-op Follow-up Note (Signed)
  Anesthesia Pain Follow-up Note  Patient: Karan Inclan  Day #: 3  Date of Follow-up: 07/08/2020 Time: 11:44 AM  Last Vitals:  Vitals:   07/08/20 0319 07/08/20 0703  BP: 114/63 121/60  Pulse: 79 77  Resp: 17 15  Temp: 37.5 C 37.3 C  SpO2: 93% 91%    Level of Consciousness: alert  Pain: none   Side Effects:None  Catheter Site Exam:clean, dry, no drainage  Anti-Coag Meds (From admission, onward)   Start     Dose/Rate Route Frequency Ordered Stop   07/06/20 0600  heparin injection 5,000 Units        5,000 Units Subcutaneous Every 8 hours 07/05/20 1315      Epidural / Intrathecal (From admission, onward)   Start     Dose/Rate Route Frequency Ordered Stop   07/05/20 1800  ropivacaine (PF) 2 mg/mL (0.2%) (NAROPIN) injection        8 mL/hr 8 mL/hr  Epidural Continuous 07/05/20 1713         Plan: Continue current therapy of postop epidural at surgeon's request.  Quinzell Malcomb,W. EDMOND

## 2020-07-08 NOTE — Addendum Note (Signed)
Addendum  created 07/08/20 1145 by Roderic Palau, MD   Clinical Note Signed

## 2020-07-09 ENCOUNTER — Encounter (HOSPITAL_COMMUNITY): Payer: Self-pay | Admitting: Anesthesiology

## 2020-07-09 LAB — CBC
HCT: 24.5 % — ABNORMAL LOW (ref 36.0–46.0)
Hemoglobin: 8.1 g/dL — ABNORMAL LOW (ref 12.0–15.0)
MCH: 24.9 pg — ABNORMAL LOW (ref 26.0–34.0)
MCHC: 33.1 g/dL (ref 30.0–36.0)
MCV: 75.4 fL — ABNORMAL LOW (ref 80.0–100.0)
Platelets: 209 10*3/uL (ref 150–400)
RBC: 3.25 MIL/uL — ABNORMAL LOW (ref 3.87–5.11)
RDW: 15.2 % (ref 11.5–15.5)
WBC: 11 10*3/uL — ABNORMAL HIGH (ref 4.0–10.5)
nRBC: 0 % (ref 0.0–0.2)

## 2020-07-09 LAB — COMPREHENSIVE METABOLIC PANEL
ALT: 31 U/L (ref 0–44)
AST: 24 U/L (ref 15–41)
Albumin: 2.3 g/dL — ABNORMAL LOW (ref 3.5–5.0)
Alkaline Phosphatase: 75 U/L (ref 38–126)
Anion gap: 6 (ref 5–15)
BUN: 7 mg/dL — ABNORMAL LOW (ref 8–23)
CO2: 25 mmol/L (ref 22–32)
Calcium: 7.8 mg/dL — ABNORMAL LOW (ref 8.9–10.3)
Chloride: 107 mmol/L (ref 98–111)
Creatinine, Ser: 0.96 mg/dL (ref 0.44–1.00)
GFR, Estimated: >60 mL/min (ref >60)
Glucose, Bld: 117 mg/dL — ABNORMAL HIGH (ref 70–99)
Potassium: 3.2 mmol/L — ABNORMAL LOW (ref 3.5–5.1)
Sodium: 138 mmol/L (ref 135–145)
Total Bilirubin: 1 mg/dL (ref 0.3–1.2)
Total Protein: 5.2 g/dL — ABNORMAL LOW (ref 6.5–8.1)

## 2020-07-09 LAB — GLUCOSE, CAPILLARY
Glucose-Capillary: 107 mg/dL — ABNORMAL HIGH (ref 70–99)
Glucose-Capillary: 110 mg/dL — ABNORMAL HIGH (ref 70–99)
Glucose-Capillary: 116 mg/dL — ABNORMAL HIGH (ref 70–99)
Glucose-Capillary: 116 mg/dL — ABNORMAL HIGH (ref 70–99)
Glucose-Capillary: 119 mg/dL — ABNORMAL HIGH (ref 70–99)
Glucose-Capillary: 121 mg/dL — ABNORMAL HIGH (ref 70–99)
Glucose-Capillary: 90 mg/dL (ref 70–99)

## 2020-07-09 MED ORDER — POTASSIUM CHLORIDE 10 MEQ/100ML IV SOLN
10.0000 meq | INTRAVENOUS | Status: AC
  Administered 2020-07-09 (×4): 10 meq via INTRAVENOUS
  Filled 2020-07-09 (×4): qty 100

## 2020-07-09 MED ORDER — PANTOPRAZOLE SODIUM 40 MG PO TBEC
40.0000 mg | DELAYED_RELEASE_TABLET | Freq: Every day | ORAL | Status: DC
  Administered 2020-07-09 – 2020-07-11 (×3): 40 mg via ORAL
  Filled 2020-07-09 (×4): qty 1

## 2020-07-09 MED ORDER — SODIUM CHLORIDE 0.9 % IV SOLN: INTRAVENOUS | Status: DC

## 2020-07-09 MED ORDER — INSULIN ASPART 100 UNIT/ML IJ SOLN
0.0000 [IU] | Freq: Three times a day (TID) | INTRAMUSCULAR | Status: DC
  Administered 2020-07-10 (×2): 2 [IU] via SUBCUTANEOUS
  Administered 2020-07-10: 3 [IU] via SUBCUTANEOUS
  Administered 2020-07-11 – 2020-07-12 (×4): 2 [IU] via SUBCUTANEOUS

## 2020-07-09 NOTE — Progress Notes (Signed)
Physical Therapy Treatment Patient Details Name: Chau Savell MRN: [id number] DOB: 1950/07/03 Today's Date: 07/09/2020    History of Present Illness Pt is a 70 y.o. 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 It was staged as a uT2N0. PMH: bil ear sensorineural hearing loss, lymphocytosis, HTN, GERD, DM, depression, bornchitis, arthritis, chronic prescription benzodiazephine use, and anxiety.    PT Comments    Pt limited this session by headache and nausea. Pt ambulating ~5 ft with a walker before requesting to sit. Chair brought up behind patient and RN notified. Of note, also notified RN that epidural bandage was rolled up and she came in to address and perform dressing change. Will continue to progress mobility as tolerated.     Follow Up Recommendations  Home health PT;Supervision/Assistance - 24 hour     Equipment Recommendations  None recommended by PT    Recommendations for Other Services       Precautions / Restrictions Precautions Precautions: Fall Precaution Comments: epidural cath, x2 R pleural drains Restrictions Weight Bearing Restrictions: No    Mobility  Bed Mobility Overal bed mobility: Needs Assistance Bed Mobility: Rolling;Sidelying to Sit Rolling: Modified independent (Device/Increase time) Sidelying to sit: Supervision       General bed mobility comments: HOB elevated, use of rails, no physical assist required    Transfers Overall transfer level: Needs assistance Equipment used: Rolling walker (2 wheeled) Transfers: Sit to/from Stand Sit to Stand: Supervision         General transfer comment: Supervision to rise from edge of bed  Ambulation/Gait Ambulation/Gait assistance: Min guard Gait Distance (Feet): 5 Feet Assistive device: Rolling walker (2 wheeled) Gait Pattern/deviations: Step-through pattern;Decreased stride  length;Trunk flexed Gait velocity: decreased   General Gait Details: Pt ambulating ~5 ft, before resting on her forearms on walker, closing her eyes, and stating, "I need to sit." PT brought chair up behind pt. Pt reporting nausea/headache. RN notified   Stairs             Wheelchair Mobility    Modified Rankin (Stroke Patients Only)       Balance Overall balance assessment: Needs assistance Sitting-balance support: Feet supported;No upper extremity supported Sitting balance-Leahy Scale: Good     Standing balance support: Bilateral upper extremity supported;During functional activity Standing balance-Leahy Scale: Poor                              Cognition Arousal/Alertness: Awake/alert Behavior During Therapy: WFL for tasks assessed/performed Overall Cognitive Status: Within Functional Limits for tasks assessed                                        Exercises      General Comments        Pertinent Vitals/Pain Faces Pain Scale: Hurts little more Pain Location: abdomen Pain Descriptors / Indicators: Discomfort;Grimacing;Guarding    Home Living                      Prior Function            PT Goals (current goals can now be found in the care plan section) Acute Rehab PT Goals Patient Stated Goal: to improve Potential to Achieve Goals: Good Progress towards  PT goals: Progressing toward goals    Frequency    Min 3X/week      PT Plan Current plan remains appropriate    Co-evaluation              AM-PAC PT "6 Clicks" Mobility   Outcome Measure  Help needed turning from your back to your side while in a flat bed without using bedrails?: None Help needed moving from lying on your back to sitting on the side of a flat bed without using bedrails?: A Little Help needed moving to and from a bed to a chair (including a wheelchair)?: A Little Help needed standing up from a chair using your arms (e.g.,  wheelchair or bedside chair)?: A Little Help needed to walk in hospital room?: A Little Help needed climbing 3-5 steps with a railing? : A Little 6 Click Score: 19    End of Session   Activity Tolerance: Patient limited by fatigue Patient left: in chair;with call bell/phone within reach;with chair alarm set Nurse Communication: Mobility status PT Visit Diagnosis: Unsteadiness on feet (R26.81);Other abnormalities of gait and mobility (R26.89);Difficulty in walking, not elsewhere classified (R26.2);Pain     Time: 1411-1434 PT Time Calculation (min) (ACUTE ONLY): 23 min  Charges:  $Therapeutic Activity: 23-37 mins                     Wyona Almas, PT, DPT Acute Rehabilitation Services Pager [phone] Office [phone]    Deno Etienne 07/09/2020, 5:09 PM

## 2020-07-09 NOTE — Progress Notes (Signed)
Anesthesiology Note:  Patient now taking PO liquids, ambulating with assistance. Epidural catheter D/Ced. Site OK, tip intact, bandage applied.  Roberts Gaudy

## 2020-07-09 NOTE — Progress Notes (Signed)
Patient was working with physical therapy and they noticed the epidural bandage was rolled up over the line so I placed a transparent tegaderm over site with tape on the sides, and the line was not moved any during the dressing change. I called Dr. Suella Broad and he said Dr. Dareen Piano will be in her room within the hour to assess if the epidural needs to be removed. I explained to him how dressing was almost off of site.

## 2020-07-09 NOTE — Progress Notes (Signed)
The epidural line was d/c today and 150 mL of ropivicaine was wasted with Eugene Garnet, RN.

## 2020-07-09 NOTE — Progress Notes (Signed)
OT Cancellation Note  Patient Details Name: Alyssa Armstrong MRN: [id number] DOB: February 26, 1950   Cancelled Treatment:    Reason Eval/Treat Not Completed: Other (comment) (Recently finished PT; nauseous and fatigued. Will return as schedule allows.)  [place], OTR/L Acute Rehab Pager: [phone] Office: [phone] 07/09/2020, 3:11 PM

## 2020-07-09 NOTE — Progress Notes (Signed)
    4 Days Post-Op  Subjective: Pt is a bit sore.  Tolerated full liquids and has had some flatus.  Walked in the hall, "but didn't want to."     Objective: Vital signs in last 24 hours: Temp:  [97.7 F (36.5 C)-98.8 F (37.1 C)] 97.7 F (36.5 C) (05/23 0320) Pulse Rate:  [78-97] 78 (05/23 0320) Resp:  [20-23] 20 (05/23 0320) BP: (117-137)/(55-92) 137/67 (05/23 0320) SpO2:  [89 %-100 %] 91 % (05/23 0320) Last BM Date:  (PTA)  Intake/Output from previous day: 05/22 0701 - 05/23 0700 In: 800 [P.O.:200; I.V.:504] Out: 1610 [Urine:1450; Drains:160] Intake/Output this shift: No intake/output data recorded.  PE: General: resting comfortably, NAD Neuro: alert and oriented, no focal deficits Resp: normal work of breathing on room air Abdomen: soft, nondistended, appropriately tender. Midline incision c/d/i with no erythema, induration or drainage. RUQ JPx2 with serosanguinous drainage, drain #2 sl murky Extremities: warm and well-perfused   Lab Results:  Recent Labs    07/08/20 0301 07/09/20 0357  WBC 12.3* 11.0*  HGB 8.4* 8.1*  HCT 24.9* 24.5*  PLT 189 209   BMET Recent Labs    07/08/20 0301 07/09/20 0357  NA 138 138  K 3.1* 3.2*  CL 103 107  CO2 28 25  GLUCOSE 119* 117*  BUN 9 7*  CREATININE 1.10* 0.96  CALCIUM 7.8* 7.8*   PT/INR No results for input(s): LABPROT, INR in the last 72 hours. CMP     Component Value Date/Time   NA 138 07/09/2020 0357   K 3.2 (L) 07/09/2020 0357   CL 107 07/09/2020 0357   CO2 25 07/09/2020 0357   GLUCOSE 117 (H) 07/09/2020 0357   BUN 7 (L) 07/09/2020 0357   CREATININE 0.96 07/09/2020 0357   CREATININE 0.92 05/28/2020 1200   CALCIUM 7.8 (L) 07/09/2020 0357   PROT 5.2 (L) 07/09/2020 0357   ALBUMIN 2.3 (L) 07/09/2020 0357   AST 24 07/09/2020 0357   AST 24 05/28/2020 1200   ALT 31 07/09/2020 0357   ALT 38 05/28/2020 1200   ALKPHOS 75 07/09/2020 0357   BILITOT 1.0 07/09/2020 0357   BILITOT 0.3 05/28/2020 1200    GFRNONAA >60 07/09/2020 0357   GFRNONAA >60 05/28/2020 1200   GFRAA >60 09/08/2019 0300   Lipase     Component Value Date/Time   LIPASE 25 05/07/2013 0450         Assessment/Plan 70 yo female with 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 (5/19 Dr. Zenia Resides) -Advance to full liquids - Sliding scale insulin q4h - Urinary retention: keep foley in place until epidural is out - Pain control: epidural, prn dilaudid.  - Out of bed, ambulate, PT consulted - Aggressive pulmonary toilet and IS - Daily PPI - Pancreatic leak: keep drains in place. Discussed with nursing that drain output needs to be charted every shift. - VTE: SQH, SCDs - Dispo: progressive care   LOS: 4 days   Milus Height, MD FACS Surgical Oncology, General Surgery, Trauma and [facility], [place] for weekday/non holidays Check [email or web address] for coverage night/weekend/holidays  Do not use SecureChat as it is not reliable for timely patient care.     07/09/20 7:38 AM

## 2020-07-10 LAB — GLUCOSE, CAPILLARY
Glucose-Capillary: 142 mg/dL — ABNORMAL HIGH (ref 70–99)
Glucose-Capillary: 122 mg/dL — ABNORMAL HIGH (ref 70–99)
Glucose-Capillary: 106 mg/dL — ABNORMAL HIGH (ref 70–99)
Glucose-Capillary: 119 mg/dL — ABNORMAL HIGH (ref 70–99)
Glucose-Capillary: 162 mg/dL — ABNORMAL HIGH (ref 70–99)

## 2020-07-10 LAB — COMPREHENSIVE METABOLIC PANEL
ALT: 32 U/L (ref 0–44)
AST: 29 U/L (ref 15–41)
Albumin: 2.6 g/dL — ABNORMAL LOW (ref 3.5–5.0)
Alkaline Phosphatase: 119 U/L (ref 38–126)
Anion gap: 10 (ref 5–15)
BUN: 6 mg/dL — ABNORMAL LOW (ref 8–23)
CO2: 25 mmol/L (ref 22–32)
Calcium: 8.3 mg/dL — ABNORMAL LOW (ref 8.9–10.3)
Chloride: 104 mmol/L (ref 98–111)
Creatinine, Ser: 0.93 mg/dL (ref 0.44–1.00)
GFR, Estimated: >60 mL/min (ref >60)
Glucose, Bld: 129 mg/dL — ABNORMAL HIGH (ref 70–99)
Potassium: 3.1 mmol/L — ABNORMAL LOW (ref 3.5–5.1)
Sodium: 139 mmol/L (ref 135–145)
Total Bilirubin: 0.9 mg/dL (ref 0.3–1.2)
Total Protein: 5.8 g/dL — ABNORMAL LOW (ref 6.5–8.1)

## 2020-07-10 LAB — CBC
HCT: 25 % — ABNORMAL LOW (ref 36.0–46.0)
Hemoglobin: 8.7 g/dL — ABNORMAL LOW (ref 12.0–15.0)
MCH: 25.7 pg — ABNORMAL LOW (ref 26.0–34.0)
MCHC: 34.8 g/dL (ref 30.0–36.0)
MCV: 74 fL — ABNORMAL LOW (ref 80.0–100.0)
Platelets: 243 10*3/uL (ref 150–400)
RBC: 3.38 MIL/uL — ABNORMAL LOW (ref 3.87–5.11)
RDW: 14.9 % (ref 11.5–15.5)
WBC: 12.1 10*3/uL — ABNORMAL HIGH (ref 4.0–10.5)
nRBC: 0.2 % (ref 0.0–0.2)

## 2020-07-10 LAB — SURGICAL PATHOLOGY

## 2020-07-10 MED ORDER — ENOXAPARIN SODIUM 40 MG/0.4ML IJ SOSY
40.0000 mg | PREFILLED_SYRINGE | INTRAMUSCULAR | Status: DC
  Administered 2020-07-10 – 2020-07-12 (×3): 40 mg via SUBCUTANEOUS
  Filled 2020-07-10 (×3): qty 0.4

## 2020-07-10 MED ORDER — POTASSIUM CHLORIDE CRYS ER 20 MEQ PO TBCR
40.0000 meq | EXTENDED_RELEASE_TABLET | Freq: Two times a day (BID) | ORAL | Status: DC
  Administered 2020-07-10 – 2020-07-12 (×5): 40 meq via ORAL
  Filled 2020-07-10 (×6): qty 2

## 2020-07-10 MED ORDER — POTASSIUM CHLORIDE 10 MEQ/100ML IV SOLN
10.0000 meq | INTRAVENOUS | Status: AC
  Administered 2020-07-10 (×4): 10 meq via INTRAVENOUS
  Filled 2020-07-10 (×4): qty 100

## 2020-07-10 MED ORDER — POTASSIUM CHLORIDE CRYS ER 20 MEQ PO TBCR: 40.0000 meq | EXTENDED_RELEASE_TABLET | Freq: Two times a day (BID) | ORAL | 0 refills | Status: AC

## 2020-07-10 MED ORDER — PANTOPRAZOLE SODIUM 40 MG PO TBEC: 40.0000 mg | DELAYED_RELEASE_TABLET | Freq: Every day | ORAL | 0 refills | Status: AC

## 2020-07-10 MED ORDER — ENSURE ENLIVE PO LIQD
237.0000 mL | Freq: Three times a day (TID) | ORAL | Status: DC
  Administered 2020-07-10 – 2020-07-12 (×4): 237 mL via ORAL
  Filled 2020-07-10 (×3): qty 237

## 2020-07-10 MED ORDER — OXYCODONE HCL 5 MG PO TABS
5.0000 mg | ORAL_TABLET | ORAL | Status: DC | PRN
Start: 2020-07-10 — End: 2020-07-12
  Administered 2020-07-11 – 2020-07-12 (×2): 10 mg via ORAL
  Filled 2020-07-10 (×2): qty 2

## 2020-07-10 MED ORDER — TRAMADOL HCL 50 MG PO TABS: 50.0000 mg | ORAL_TABLET | Freq: Four times a day (QID) | ORAL | Status: DC | PRN

## 2020-07-10 MED ORDER — ONDANSETRON 4 MG PO TBDP: 4.0000 mg | ORAL_TABLET | Freq: Four times a day (QID) | ORAL | 0 refills | Status: AC | PRN

## 2020-07-10 NOTE — Progress Notes (Signed)
    5 Days Post-Op  Subjective: Some nausea yesterday.  Only needed a small amount of insulin.  Got OOB. Still very sore.    Objective: Vital signs in last 24 hours: Temp:  [98.8 F (37.1 C)-99.3 F (37.4 C)] 98.8 F (37.1 C) (05/24 0750) Pulse Rate:  [83-87] 83 (05/24 0626) Resp:  [15-27] 18 (05/24 0626) BP: (112-146)/(45-81) 141/45 (05/24 0750) SpO2:  [91 %-95 %] 95 % (05/24 0626) Last BM Date:  (PTA)  Intake/Output from previous day: 05/23 0701 - 05/24 0700 In: 1722.3 [P.O.:960; I.V.:762.3] Out: 4960 [Urine:4850; Drains:110] Intake/Output this shift: No intake/output data recorded.  PE: General: resting comfortably, NAD Neuro: alert and oriented, no focal deficits Resp: normal work of breathing on room air Abdomen: soft, nondistended, appropriately tender.  Midline incision c/d/i with no erythema, induration or drainage. RUQ JPx2 with serosanguinous drainage. Extremities: warm and well-perfused   Lab Results:  Recent Labs    07/09/20 0357 07/10/20 0013  WBC 11.0* 12.1*  HGB 8.1* 8.7*  HCT 24.5* 25.0*  PLT 209 243   BMET Recent Labs    07/09/20 0357 07/10/20 0013  NA 138 139  K 3.2* 3.1*  CL 107 104  CO2 25 25  GLUCOSE 117* 129*  BUN 7* 6*  CREATININE 0.96 0.93  CALCIUM 7.8* 8.3*   PT/INR No results for input(s): LABPROT, INR in the last 72 hours. CMP     Component Value Date/Time   NA 139 07/10/2020 0013   K 3.1 (L) 07/10/2020 0013   CL 104 07/10/2020 0013   CO2 25 07/10/2020 0013   GLUCOSE 129 (H) 07/10/2020 0013   BUN 6 (L) 07/10/2020 0013   CREATININE 0.93 07/10/2020 0013   CREATININE 0.92 05/28/2020 1200   CALCIUM 8.3 (L) 07/10/2020 0013   PROT 5.8 (L) 07/10/2020 0013   ALBUMIN 2.6 (L) 07/10/2020 0013   AST 29 07/10/2020 0013   AST 24 05/28/2020 1200   ALT 32 07/10/2020 0013   ALT 38 05/28/2020 1200   ALKPHOS 119 07/10/2020 0013   BILITOT 0.9 07/10/2020 0013   BILITOT 0.3 05/28/2020 1200   GFRNONAA >60 07/10/2020 0013   GFRNONAA  >60 05/28/2020 1200   GFRAA >60 09/08/2019 0300   Lipase     Component Value Date/Time   LIPASE 25 05/07/2013 0450         Assessment/Plan 70 yo female with 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 (5/19 Dr. Zenia Resides) -soft diet, calorie counts.  Nausea control Hypokalemia- increase oral K - Sliding scale insulin qachs Diabetes education for blood glucose monitoring. Epidural out, d/c foley Prn tylenol, oxy, tramadol and iv dilaudid for breakthrough - Out of bed, ambulate, PT consulted - Aggressive pulmonary toilet and IS - Daily PPI - Pancreatic leak: keep drains in place. - VTE: SQH, SCDs - Dispo:transfer to floor   LOS: 5 days   Milus Height, MD FACS Surgical Oncology, General Surgery, Trauma and [facility], [place] for weekday/non holidays Check [email or web address] for coverage night/weekend/holidays  Do not use SecureChat as it is not reliable for timely patient care.     07/10/20 9:22 AM

## 2020-07-10 NOTE — Care Management Important Message (Signed)
Important Message  Patient Details  Name: Alyssa Armstrong MRN: [id number] Date of Birth: 23-Oct-1950   Medicare Important Message Given:  Yes     Orbie Pyo 07/10/2020, 4:05 PM

## 2020-07-10 NOTE — Plan of Care (Signed)

## 2020-07-10 NOTE — Progress Notes (Signed)
Occupational Therapy Treatment + Discharge Patient Details Name: Alyssa Armstrong MRN: [id number] DOB: 11-Feb-1951 Today's Date: 07/10/2020    History of present illness Pt is a 70 y.o. 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 It was staged as a uT2N0. PMH: bil ear sensorineural hearing loss, lymphocytosis, HTN, GERD, DM, depression, bornchitis, arthritis, chronic prescription benzodiazephine use, and anxiety.   OT comments  Pt progressing to OT d/c today. Pt with decreased pain today and not requiring physical assist for ADL or mobility, supervisionA for drain lines. Pt met all goals and able to perform all tasks without physical assist. Education for energy conservation provided and pt aware of fall prevention strategies mobility with RW in room; no physical assist provided. O2 on RA >93% O2.. Pt does not require continued OT skilled services. OT signing off. Thank you.   Follow Up Recommendations  No OT follow up    Equipment Recommendations  None recommended by OT    Recommendations for Other Services      Precautions / Restrictions Precautions Precautions: Fall Precaution Comments: epidural cath, x2 R pleural drains Restrictions Weight Bearing Restrictions: No       Mobility Bed Mobility Overal bed mobility: Modified Independent                  Transfers Overall transfer level: Needs assistance Equipment used: Rolling walker (2 wheeled) Transfers: Sit to/from Stand Sit to Stand: Supervision         General transfer comment: supervisionA to mod I with mobility today    Balance Overall balance assessment: Needs assistance Sitting-balance support: Feet supported;No upper extremity supported Sitting balance-Leahy Scale: Good     Standing balance support: No upper extremity supported Standing balance-Leahy Scale: Fair Standing balance  comment: with RW                           ADL either performed or assessed with clinical judgement   ADL Overall ADL's : At baseline     Grooming: Wash/dry face;Wash/dry hands;Oral care;Supervision/safety;Standing       Lower Body Bathing: Supervison/ safety;Sitting/lateral leans;Sit to/from stand Lower Body Bathing Details (indicate cue type and reason): standing at sink     Lower Body Dressing: Supervision/safety;Sitting/lateral leans;Sit to/from stand Lower Body Dressing Details (indicate cue type and reason): figure 4 at [facility] Toilet Transfer: Supervision/safety;Ambulation;RW   Toileting- Clothing Manipulation and Hygiene: Supervision/safety;Sitting/lateral lean;Sit to/from stand       Functional mobility during ADLs: Supervision/safety;Rolling walker General ADL Comments: Pt with decreased pain today and not requiring physical assist for ADL or mobility. Pt met all goals and able to perform all tasks without physical assist. Education for energy conservation provided and pt aware of fall prevention strategies.     Vision       Perception     Praxis      Cognition Arousal/Alertness: Awake/alert Behavior During Therapy: WFL for tasks assessed/performed Overall Cognitive Status: Within Functional Limits for tasks assessed                                 General Comments: Pt A/O x4.        Exercises     Shoulder Instructions       General Comments mobility with RW in room; no physical assist  provided. O2 on RA >93% O2.    Pertinent Vitals/ Pain       Pain Assessment: 0-10 Pain Score: 0-No pain Pain Intervention(s): Monitored during session;Repositioned  Home Living                                          Prior Functioning/Environment              Frequency  Min 2X/week        Progress Toward Goals  OT Goals(current goals can now be found in the care plan section)  Progress towards OT goals:  Progressing toward goals  Acute Rehab OT Goals Patient Stated Goal: to improve OT Goal Formulation: With patient Time For Goal Achievement: 07/22/20 Potential to Achieve Goals: Good ADL Goals Pt Will Perform Grooming: (P) with supervision;standing Pt Will Transfer to Toilet: (P) with supervision;ambulating;regular height toilet Additional ADL Goal #1: (P) Pt will perform OOB ADL tasks with supervisionA x6 mins Additional ADL Goal #2: (P) Pt will perform functional transfers with supervisionA  Plan All goals met and education completed, patient discharged from OT services    Co-evaluation                 AM-PAC OT "6 Clicks" Daily Activity     Outcome Measure   Help from another person eating meals?: None Help from another person taking care of personal grooming?: None Help from another person toileting, which includes using toliet, bedpan, or urinal?: A Little Help from another person bathing (including washing, rinsing, drying)?: A Little Help from another person to put on and taking off regular upper body clothing?: None Help from another person to put on and taking off regular lower body clothing?: A Little 6 Click Score: 21    End of Session Equipment Utilized During Treatment: Gait belt;Rolling walker  OT Visit Diagnosis: Unsteadiness on feet (R26.81)   Activity Tolerance Patient tolerated treatment well   Patient Left in bed;with call bell/phone within reach;with bed alarm set   Nurse Communication Mobility status        Time: 8144-8185 OT Time Calculation (min): 30 min  Charges: OT General Charges $OT Visit: 1 Visit OT Treatments $Self Care/Home Management : 8-22 mins $Therapeutic Activity: 8-22 mins  Jefferey Pica, OTR/L Acute Rehabilitation Services Pager: [phone] Office: [phone]   Amia Rynders C 07/10/2020, 5:56 PM

## 2020-07-10 NOTE — Discharge Instructions (Signed)
[facility], [place] [phone]  ABDOMINAL SURGERY: POST OP INSTRUCTIONS  Always review your discharge instruction sheet given to you by the facility where your surgery was performed.  IF YOU HAVE DISABILITY OR FAMILY LEAVE FORMS, YOU MUST BRING THEM TO THE OFFICE FOR PROCESSING.  PLEASE DO NOT GIVE THEM TO YOUR DOCTOR.  1. A prescription for pain medication may be given to you upon discharge.  Take your pain medication as prescribed, if needed.  If narcotic pain medicine is not needed, then you may take acetaminophen (Tylenol) or ibuprofen (Advil) as needed. 2. Take your usually prescribed medications unless otherwise directed. 3. If you need a refill on your pain medication, please contact your pharmacy. They will contact our office to request authorization.  Prescriptions will not be filled after 5pm or on week-ends. 4. You should follow a light diet the first few days after arrival home, such as soup and crackers, pudding, etc.unless your doctor has advised otherwise. A high-fiber, low fat diet can be resumed as tolerated.   Be sure to include lots of fluids daily. Most patients will experience some swelling and bruising on the chest and neck area.  Ice packs will help.  Swelling and bruising can take several days to resolve 5. Most patients will experience some swelling and bruising in the area of the incision. Ice pack will help. Swelling and bruising can take several days to resolve..  6. It is common to experience some constipation if taking pain medication after surgery.  Increasing fluid intake and taking a stool softener will usually help or prevent this problem from occurring.  A mild laxative (Milk of Magnesia or Miralax) should be taken according to package directions if there are no bowel movements after 48 hours. 7.  You may have steri-strips (small skin tapes) in place directly over the incision.  These strips should be left on the skin for 10-14 days.  If your  surgeon used skin glue on the incision, you may shower in 48 hours.  The glue will flake off over the next 2-3 weeks.  Any sutures or staples will be removed at the office during your follow-up visit. You may find that a light gauze bandage over your incision may keep your staples from being rubbed or pulled. You may shower and replace the bandage daily. 8. ACTIVITIES:  You may resume regular (light) daily activities beginning the next day--such as daily self-care, walking, climbing stairs--gradually increasing activities as tolerated.  You may have sexual intercourse when it is comfortable.  Refrain from any heavy lifting or straining until approved by your doctor. a. You may drive when you no longer are taking prescription pain medication, you can comfortably wear a seatbelt, and you can safely maneuver your car and apply brakes b. Return to Work: __________8 weeks if applicable_________________________ 44. You should see your doctor in the office for a follow-up appointment approximately two weeks after your surgery.  Make sure that you call for this appointment within a day or two after you arrive home to insure a convenient appointment time. OTHER INSTRUCTIONS:  _____________________________________________________________ _____________________________________________________________  WHEN TO CALL YOUR DOCTOR: 1. Fever over 101.0 2. Inability to urinate 3. Nausea and/or vomiting 4. Extreme swelling or bruising 5. Continued bleeding from incision. 6. Increased pain, redness, or drainage from the incision. 7. Difficulty swallowing or breathing 8. Muscle cramping or spasms. 9. Numbness or tingling in hands or feet or around lips.  The clinic staff is  available to answer your questions during regular business hours.  Please don't hesitate to call and ask to speak to one of the nurses if you have concerns.  For further questions, please visit [email or web address]     Surgical  [facility] Care Surgical drains are used to remove extra fluid that normally builds up in a surgical wound after surgery. A surgical drain helps to heal a surgical wound. Different kinds of surgical drains include:  Passive drains. These drains allow fluid to drain naturally, by gravity. Drainage flows through a tube to a bandage (dressing) or a container outside of the body. Passive drains do not need to be emptied. A drain is placed during surgery. Right after surgery, drainage is usually bright red and a little thicker than water. The drainage may gradually turn yellow or pink and become thinner. It is likely that your health care provider will remove the drain when the drainage stops or when the amount decreases to 1-2 Tbsp (15-30 mL) during a 24-hour period. Supplies needed:  Tape.  Germ-free cleaning solution (sterile saline).  Cotton swabs.  Split gauze drain sponge: 4 x 4 inches (10 x 10 cm).  Gauze square: 4 x 4 inches (10 x 10 cm). How to care for your surgical drain Care for your drain as told by your health care provider. This is important to help prevent infection. If your drain is placed at your back, or any other hard-to-reach area, ask another person to assist you in performing the following tasks: General care  Keep the skin around the drain dry and covered with a dressing at all times.  Check your drain area every day for signs of infection. Check for: ? Redness, swelling, or pain. ? Pus or a bad smell. ? Cloudy drainage. ? Tenderness or pressure at the drain exit site. Changing the dressing Follow instructions from your health care provider about how to change your dressing. Change your dressing at least once a day. Change it more often if needed to keep the dressing dry. Make sure you: 1. Gather your supplies. 2. Wash your hands with soap and water before you change your dressing. If soap and water are not available, use hand sanitizer. 3. Remove the old dressing.  Avoid using scissors to do that. 4. Wash your hands with soap and water again after removing the old dressing. 5. Use sterile saline to clean your skin around the drain. You may need to use a cotton swab to clean the skin. 6. Place the tube through the slit in a drain sponge. Place the drain sponge so that it covers your wound. 7. Place the gauze square or another drain sponge on top of the drain sponge that is on the wound. Make sure the tube is between those layers. 8. Tape the dressing to your skin. 9. Tape the drainage tube to your skin 1-2 inches (2.5-5 cm) below the place where the tube enters your body. Taping keeps the tube from pulling on any stitches (sutures) that you have. 10. Wash your hands with soap and water. 11. Write down the color of your drainage and how often you change your dressing. How to empty your active drain 1. Make sure that you have a measuring cup that you can empty your drainage into. 2. Wash your hands with soap and water. If soap and water are not available, use hand sanitizer. 3. Loosen any pins or clips that hold the tube in place. 4. If your health care provider tells  you to strip the tube to prevent clots and tube blockages: ? Hold the tube at the skin with one hand. Use your other hand to pinch the tubing with your thumb and first finger. ? Gently move your fingers down the tube while squeezing very lightly. This clears any drainage, clots, or tissue from the tube. ? You may need to do this several times each day to keep the tube clear. Do not pull on the tube. 5. Open the bulb cap or the drain plug. Do not touch the inside of the cap or the bottom of the plug. 6. Turn the device upside down and gently squeeze. 7. Empty all of the drainage into the measuring cup. 8. Compress the bulb or the container and replace the cap or the plug. To compress the bulb or the container, squeeze it firmly in the middle while you close the cap or plug the container. 9. Write  down the amount of drainage that you have in each 24-hour period. If you have less than 2 Tbsp (30 mL) of drainage during 24 hours, contact your health care provider. 10. Flush the drainage down the toilet. 11. Wash your hands with soap and water.   Contact a health care provider if:  You have redness, swelling, or pain around your drain area.  You have pus or a bad smell coming from your drain area.  You have a fever or chills.  The skin around your drain is warm to the touch.  The amount of drainage that you have is increasing instead of decreasing.  You have drainage that is cloudy.  There is a sudden stop or a sudden decrease in the amount of drainage that you have.  Your drain tube falls out.  Your active drain does not stay compressed after you empty it. Summary  Surgical drains are used to remove extra fluid that normally builds up in a surgical wound after surgery.  Different kinds of surgical drains include active drains and passive drains. Active drains use suction to pull drainage away from the surgical wound, and passive drains allow fluid to drain naturally.  It is important to care for your drain to prevent infection. If your drain is placed at your back, or any other hard-to-reach area, ask another person to assist you.  Contact your health care provider if you have redness, swelling, or pain around your drain area. This information is not intended to replace advice given to you by your health care provider. Make sure you discuss any questions you have with your health care provider. Document Revised: 03/10/2018 Document Reviewed: 03/10/2018 Elsevier Patient Education  2021 [employer].

## 2020-07-10 NOTE — Progress Notes (Signed)
Initial Nutrition Assessment  DOCUMENTATION CODES:   Obesity unspecified  INTERVENTION:   - 48-hour calorie count, RD to follow up tomorrow (5/25) with day 1 results  - Ensure Enlive po TID, each supplement provides 350 kcal and 20 grams of protein  - Magic Cup TID with meals, each supplement provides 290 kcal and 9 grams of protein  - Encourage adequate PO intake  NUTRITION DIAGNOSIS:   Increased nutrient needs related to acute illness (pancreatic neuroendocrine tumor s/p Whipple procedure) as evidenced by estimated needs.  GOAL:   Patient will meet greater than or equal to 90% of their needs  MONITOR:   PO intake,Supplement acceptance,Labs,Weight trends,Skin  REASON FOR ASSESSMENT:   Consult Calorie Count  ASSESSMENT:   70 year old female who presented on 5/19 for surgery. PMH of newly diagnosed pancreatic neuroendocrine tumor, anxiety, T2DM, depression, GERD, HTN. Pt is s/p Whipple procedure on 5/19.   5/20 - NG tube removed, clear liquids 5/21 - full liquids 5/23 - soft diet  RD received consult for 48-hour calorie count. Discussed with RN. RD noted pt consumed 50% of peaches on her breakfast meal tray along with one bite of grits. RD will follow up tomorrow for day 1 calorie count results.  Spoke with pt at bedside. Pt reports very poor appetite since surgery. She states that this is unusual for her as she normally has a great appetite and eats well. Pt reports that the smell of food affects her and she does not want to eat. RD discussed with pt the ability to order cold food items like sandwiches to help decrease the smell.  Pt denies any recent weight loss. Reviewed weight history in chart. Admit weight of 91.9 kg appears to be copied over from previous encounter on 06/29/20. Weight is up ~2-3 kg over the last year.  Pt willing to consume oral nutrition supplements to maximize kcal and protein intake. RD to order. Discussed with pt the importance of adequate  nutrition in healing and maintaining lean muscle mass.  Meal Completion: 10-100%  Medications reviewed and include: SSI, protonix, IV KCl 10 mEq x 4 runs IVF: D5 in 1/2NS @ 42 ml/hr  Labs reviewed: potassium 3.1, hemoglobin 8.7 CBG's: 90-142 x 24 hours  UOP: 4850 ml x 24 hours Abdomen drain 1: 10 ml x 24 hours Abdomen drain 2: 100 ml x 24 hours I/O's: -1.5 L since admit  NUTRITION - FOCUSED PHYSICAL EXAM:  Flowsheet Row Most Recent Value  Orbital Region No depletion  Upper Arm Region No depletion  Thoracic and Lumbar Region No depletion  Buccal Region No depletion  Temple Region No depletion  Clavicle Bone Region No depletion  Clavicle and Acromion Bone Region No depletion  Scapular Bone Region No depletion  Dorsal Hand No depletion  Patellar Region No depletion  Anterior Thigh Region No depletion  Posterior Calf Region No depletion  Edema (RD Assessment) None  Hair Reviewed  Eyes Reviewed  Mouth Reviewed  Skin Reviewed  Nails Reviewed       Diet Order:   Diet Order            DIET SOFT Room service appropriate? Yes; Fluid consistency: Thin  Diet effective now                 EDUCATION NEEDS:   Education needs have been addressed  Skin:  Skin Assessment: Skin Integrity Issues: Incisions: abdomen  Last BM:  07/10/20 medium type 2  Height:   Ht Readings from Last 1  Encounters:  07/05/20 5\' 4"  (1.626 m)    Weight:   Wt Readings from Last 1 Encounters:  07/05/20 91.9 kg    BMI:  Body mass index is 34.78 kg/m.  Estimated Nutritional Needs:   Kcal:  1900-2100  Protein:  90-110 grams  Fluid:  1.9-2.1 L/day    Gustavus Bryant, MS, RD, LDN Inpatient Clinical Dietitian Please see AMiON for contact information.

## 2020-07-11 LAB — CBC
HCT: 26.4 % — ABNORMAL LOW (ref 36.0–46.0)
Hemoglobin: 9.1 g/dL — ABNORMAL LOW (ref 12.0–15.0)
MCH: 25.4 pg — ABNORMAL LOW (ref 26.0–34.0)
MCHC: 34.5 g/dL (ref 30.0–36.0)
MCV: 73.7 fL — ABNORMAL LOW (ref 80.0–100.0)
Platelets: 288 10*3/uL (ref 150–400)
RBC: 3.58 MIL/uL — ABNORMAL LOW (ref 3.87–5.11)
RDW: 14.8 % (ref 11.5–15.5)
WBC: 12.8 10*3/uL — ABNORMAL HIGH (ref 4.0–10.5)
nRBC: 0.7 % — ABNORMAL HIGH (ref 0.0–0.2)

## 2020-07-11 LAB — COMPREHENSIVE METABOLIC PANEL
ALT: 34 U/L (ref 0–44)
AST: 39 U/L (ref 15–41)
Albumin: 2.5 g/dL — ABNORMAL LOW (ref 3.5–5.0)
Alkaline Phosphatase: 141 U/L — ABNORMAL HIGH (ref 38–126)
Anion gap: 8 (ref 5–15)
BUN: 6 mg/dL — ABNORMAL LOW (ref 8–23)
CO2: 24 mmol/L (ref 22–32)
Calcium: 8.2 mg/dL — ABNORMAL LOW (ref 8.9–10.3)
Chloride: 107 mmol/L (ref 98–111)
Creatinine, Ser: 0.9 mg/dL (ref 0.44–1.00)
GFR, Estimated: >60 mL/min (ref >60)
Glucose, Bld: 121 mg/dL — ABNORMAL HIGH (ref 70–99)
Potassium: 3.5 mmol/L (ref 3.5–5.1)
Sodium: 139 mmol/L (ref 135–145)
Total Bilirubin: 0.8 mg/dL (ref 0.3–1.2)
Total Protein: 5.7 g/dL — ABNORMAL LOW (ref 6.5–8.1)

## 2020-07-11 LAB — GLUCOSE, CAPILLARY
Glucose-Capillary: 136 mg/dL — ABNORMAL HIGH (ref 70–99)
Glucose-Capillary: 137 mg/dL — ABNORMAL HIGH (ref 70–99)
Glucose-Capillary: 131 mg/dL — ABNORMAL HIGH (ref 70–99)
Glucose-Capillary: 101 mg/dL — ABNORMAL HIGH (ref 70–99)
Glucose-Capillary: 114 mg/dL — ABNORMAL HIGH (ref 70–99)

## 2020-07-11 NOTE — Plan of Care (Signed)
  Problem: Education: Goal: Knowledge of General Education information will improve Description: Including pain rating scale, medication(s)/side effects and non-pharmacologic comfort measures Outcome: Progressing   Problem: Health Behavior/Discharge Planning: Goal: Ability to manage health-related needs will improve Outcome: Progressing   Problem: Clinical Measurements: Goal: Ability to maintain clinical measurements within normal limits will improve Outcome: Progressing Goal: Cardiovascular complication will be avoided Outcome: Progressing   Problem: Activity: Goal: Risk for activity intolerance will decrease Outcome: Progressing   Problem: Nutrition: Goal: Adequate nutrition will be maintained Outcome: Progressing   Problem: Coping: Goal: Level of anxiety will decrease Outcome: Progressing   Problem: Elimination: Goal: Will not experience complications related to bowel motility Outcome: Progressing Goal: Will not experience complications related to urinary retention Outcome: Progressing   Problem: Pain Managment: Goal: General experience of comfort will improve Outcome: Progressing   Problem: Safety: Goal: Ability to remain free from injury will improve Outcome: Progressing   Problem: Skin Integrity: Goal: Risk for impaired skin integrity will decrease Outcome: Progressing

## 2020-07-11 NOTE — Progress Notes (Signed)
Physical Therapy Treatment Patient Details Name: Alyssa Armstrong MRN: [id number] DOB: [birth year] Today's Date: 07/11/2020    History of Present Illness Pt is a 70 y.o. 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 It was staged as a uT2N0. PMH: bil ear sensorineural hearing loss, lymphocytosis, HTN, GERD, DM, depression, bornchitis, arthritis, chronic prescription benzodiazephine use, and anxiety.    PT Comments    Pt making excellent progress towards her physical therapy goals; in good spirits with improved pain control and activity tolerance. Ambulating x 200 feet with a walker at a supervision level. HR stable in 90's. Education provided regarding activity/DME recommendations. D/c plan remains appropriate.     Follow Up Recommendations  Home health PT    Equipment Recommendations  None recommended by PT    Recommendations for Other Services       Precautions / Restrictions Precautions Precautions: Fall Precaution Comments: x2 R pleural drains Restrictions Weight Bearing Restrictions: No    Mobility  Bed Mobility Overal bed mobility: Modified Independent             General bed mobility comments: No physical assist required to progress to edge of bed    Transfers Overall transfer level: Modified independent Equipment used: Rolling walker (2 wheeled)                Ambulation/Gait Ambulation/Gait assistance: Supervision Gait Distance (Feet): 200 Feet Assistive device: Rolling walker (2 wheeled) Gait Pattern/deviations: Step-through pattern;Decreased stride length Gait velocity: decreased   General Gait Details: Slow and steady pace, good posture, min cues for walker positioning. Supervision for safety   Stairs             Wheelchair Mobility    Modified Rankin (Stroke Patients Only)       Balance Overall balance assessment:  Needs assistance Sitting-balance support: Feet supported;No upper extremity supported Sitting balance-Leahy Scale: Good     Standing balance support: During functional activity;No upper extremity supported Standing balance-Leahy Scale: Fair                              Cognition Arousal/Alertness: Awake/alert Behavior During Therapy: WFL for tasks assessed/performed Overall Cognitive Status: Within Functional Limits for tasks assessed                                        Exercises      General Comments        Pertinent Vitals/Pain Pain Assessment: Faces Faces Pain Scale: Hurts little more Pain Location: abdomen Pain Descriptors / Indicators: Discomfort;Grimacing;Guarding Pain Intervention(s): Monitored during session    Home Living                      Prior Function            PT Goals (current goals can now be found in the care plan section) Acute Rehab PT Goals Patient Stated Goal: to improve Potential to Achieve Goals: Good Progress towards PT goals: Progressing toward goals    Frequency    Min 3X/week      PT Plan Current plan remains appropriate    Co-evaluation              AM-PAC PT "6 Clicks" Mobility  Outcome Measure  Help needed turning from your back to your side while in a flat bed without using bedrails?: None Help needed moving from lying on your back to sitting on the side of a flat bed without using bedrails?: None Help needed moving to and from a bed to a chair (including a wheelchair)?: A Little Help needed standing up from a chair using your arms (e.g., wheelchair or bedside chair)?: None Help needed to walk in hospital room?: A Little Help needed climbing 3-5 steps with a railing? : A Little 6 Click Score: 21    End of Session   Activity Tolerance: Patient tolerated treatment well Patient left: in chair;with call bell/phone within reach Nurse Communication: Mobility status PT Visit  Diagnosis: Unsteadiness on feet (R26.81);Other abnormalities of gait and mobility (R26.89);Difficulty in walking, not elsewhere classified (R26.2);Pain     Time: 4888-9169 PT Time Calculation (min) (ACUTE ONLY): 19 min  Charges:  $Gait Training: 8-22 mins                     Wyona Almas, PT, DPT Acute Rehabilitation Services Pager [phone] Office [phone]    Deno Etienne 07/11/2020, 11:12 AM

## 2020-07-11 NOTE — Plan of Care (Signed)
  Problem: Education: Goal: Knowledge of General Education information will improve Description: Including pain rating scale, medication(s)/side effects and non-pharmacologic comfort measures Outcome: Progressing   Problem: Health Behavior/Discharge Planning: Goal: Ability to manage health-related needs will improve Outcome: Progressing   Problem: Clinical Measurements: Goal: Ability to maintain clinical measurements within normal limits will improve Outcome: Progressing Goal: Will remain free from infection Outcome: Completed/Met Goal: Diagnostic test results will improve Outcome: Completed/Met Goal: Respiratory complications will improve Outcome: Completed/Met

## 2020-07-11 NOTE — Progress Notes (Signed)
6 Days Post-Op   Subjective/Chief Complaint: Feels well, glucose has been good, ambulating tol diet, has baseline preop nausea   Objective: Vital signs in last 24 hours: Temp:  [97.8 F (36.6 C)-99.4 F (37.4 C)] 99.4 F (37.4 C) (05/25 0759) Pulse Rate:  [79-95] 85 (05/25 0759) Resp:  [20] 20 (05/25 0759) BP: (123-152)/(56-79) 149/62 (05/25 0759) SpO2:  [92 %-96 %] 96 % (05/25 0759) Last BM Date: 07/10/20  Intake/Output from previous day: 05/24 0701 - 05/25 0700 In: 1592.3 [P.O.:480; I.V.:1070.3] Out: 1776 [Urine:1725; Drains:50; Stool:1] Intake/Output this shift: No intake/output data recorded.  General:  NAD Resp: normal work of breathing on room air Abdomen: soft, nondistended, appropriately tender.  Midline incision c/d/i with no erythema, induration or drainage. RUQ JPx2 with serosanguinous drainage.  Lab Results:  Recent Labs    07/10/20 0013 07/11/20 0134  WBC 12.1* 12.8*  HGB 8.7* 9.1*  HCT 25.0* 26.4*  PLT 243 288   BMET Recent Labs    07/10/20 0013 07/11/20 0134  NA 139 139  K 3.1* 3.5  CL 104 107  CO2 25 24  GLUCOSE 129* 121*  BUN 6* 6*  CREATININE 0.93 0.90  CALCIUM 8.3* 8.2*   PT/INR No results for input(s): LABPROT, INR in the last 72 hours. ABG No results for input(s): PHART, HCO3 in the last 72 hours.  Invalid input(s): PCO2, PO2  Studies/Results: No results found.  Anti-infectives: Anti-infectives (From admission, onward)   Start     Dose/Rate Route Frequency Ordered Stop   07/05/20 0600  ceFAZolin (ANCEF) IVPB 2g/100 mL premix       "And" Linked Group Details   2 g 200 mL/hr over 30 Minutes Intravenous On call to O.R. 07/05/20 0559 07/05/20 1152   07/05/20 0600  metroNIDAZOLE (FLAGYL) IVPB 500 mg       "And" Linked Group Details   500 mg 100 mL/hr over 60 Minutes Intravenous On call to O.R. 07/05/20 0559 07/05/20 0757      Assessment/Plan: 70 yo female with neuroendocrine tumor of the head of the pancreas, POD 5 s/p  Whipple. (5/19 Dr. Zenia Resides) -soft diet Nausea control Hypokalemia- continue oral K - Sliding scale insulin qachs Diabetes education for blood glucose monitoring. Prn tylenol, oxy, tramadol and iv dilaudid for breakthrough - Out of bed, ambulate, PT consulted - Aggressive pulmonary toilet and IS - Daily PPI - Pancreatic leak: keep drains in place. - VTE: SQH, SCDs - Dispo:transfer to floor, likely home tomorrow Rolm Bookbinder 07/11/2020

## 2020-07-11 NOTE — Progress Notes (Signed)
Calorie Count Note: Day 1 Results  48-hour calorie count ordered. Calorie count was started yesterday with breakfast meal. Please see day 1 results below. RD will follow up tomorrow with day 2 results.  Diet: soft diet, thin liquids Supplements:  - Ensure Enlive TID - Magic Cup TID  07/10/20: Breakfast: 30 kcal, 0 grams of protein Lunch: 145 kcal, 3 grams of protein Dinner: 325 kcal, 10 grams of protein Supplements: 350 kcal, 20 grams of protein  Day 1 total 24-hour intake: 850 kcal (45% of minimum estimated needs)  33 grams of protein (37% of minimum estimated needs)  Nutrition Diagnosis: Increased nutrient needs related to acute illness (pancreatic neuroendocrine tumor s/p Whipple procedure) as evidenced by estimated needs.  Goal: Patient will meet greater than or equal to 90% of their needs  Intervention:  - Continue 48-hour calorie count for 1 more day - Continue Ensure Jeanne Ivan TID - Continue Magic Cup TID   Gustavus Bryant, MS, RD, LDN Inpatient Clinical Dietitian Please see AMiON for contact information.

## 2020-07-12 LAB — CBC
HCT: 26.1 % — ABNORMAL LOW (ref 36.0–46.0)
Hemoglobin: 8.9 g/dL — ABNORMAL LOW (ref 12.0–15.0)
MCH: 25 pg — ABNORMAL LOW (ref 26.0–34.0)
MCHC: 34.1 g/dL (ref 30.0–36.0)
MCV: 73.3 fL — ABNORMAL LOW (ref 80.0–100.0)
Platelets: 321 10*3/uL (ref 150–400)
RBC: 3.56 MIL/uL — ABNORMAL LOW (ref 3.87–5.11)
RDW: 14.8 % (ref 11.5–15.5)
WBC: 12.2 10*3/uL — ABNORMAL HIGH (ref 4.0–10.5)
nRBC: 0.7 % — ABNORMAL HIGH (ref 0.0–0.2)

## 2020-07-12 LAB — COMPREHENSIVE METABOLIC PANEL
ALT: 35 U/L (ref 0–44)
AST: 43 U/L — ABNORMAL HIGH (ref 15–41)
Albumin: 2.5 g/dL — ABNORMAL LOW (ref 3.5–5.0)
Alkaline Phosphatase: 183 U/L — ABNORMAL HIGH (ref 38–126)
Anion gap: 8 (ref 5–15)
BUN: 6 mg/dL — ABNORMAL LOW (ref 8–23)
CO2: 23 mmol/L (ref 22–32)
Calcium: 8.2 mg/dL — ABNORMAL LOW (ref 8.9–10.3)
Chloride: 103 mmol/L (ref 98–111)
Creatinine, Ser: 0.93 mg/dL (ref 0.44–1.00)
GFR, Estimated: >60 mL/min (ref >60)
Glucose, Bld: 128 mg/dL — ABNORMAL HIGH (ref 70–99)
Potassium: 3.7 mmol/L (ref 3.5–5.1)
Sodium: 134 mmol/L — ABNORMAL LOW (ref 135–145)
Total Bilirubin: 0.6 mg/dL (ref 0.3–1.2)
Total Protein: 5.8 g/dL — ABNORMAL LOW (ref 6.5–8.1)

## 2020-07-12 LAB — GLUCOSE, CAPILLARY
Glucose-Capillary: 136 mg/dL — ABNORMAL HIGH (ref 70–99)
Glucose-Capillary: 112 mg/dL — ABNORMAL HIGH (ref 70–99)

## 2020-07-12 MED ORDER — OXYCODONE HCL 10 MG PO TABS: 5.0000 mg | ORAL_TABLET | Freq: Four times a day (QID) | ORAL | 0 refills | Status: AC | PRN

## 2020-07-12 MED ORDER — ENSURE ENLIVE PO LIQD: 237.0000 mL | Freq: Three times a day (TID) | ORAL | 12 refills | Status: AC

## 2020-07-12 MED ORDER — DOCUSATE SODIUM 100 MG PO CAPS
100.0000 mg | ORAL_CAPSULE | Freq: Two times a day (BID) | ORAL | Status: DC
  Administered 2020-07-12: 100 mg via ORAL
  Filled 2020-07-12: qty 1

## 2020-07-12 MED ORDER — BLOOD GLUCOSE MONITOR KIT: PACK | 0 refills | Status: AC

## 2020-07-12 MED ORDER — DOCUSATE SODIUM 100 MG PO CAPS: 100.0000 mg | ORAL_CAPSULE | Freq: Every day | ORAL | Status: AC | PRN

## 2020-07-12 NOTE — Discharge Summary (Signed)
[name] Discharge Summary   Patient ID: Alyssa Armstrong MRN: [id number] DOB/AGE: 10/06/1950 70 y.o.  Admit date: 07/05/2020 Discharge date: 07/12/2020  Admitting Diagnosis: Neuroendocrine tumor of the pancreas  Discharge Diagnosis Patient Active Problem List   Diagnosis Date Noted  . Pancreatic tumor 07/05/2020  . Neuroendocrine tumor of pancreas 07/05/2020  . Pre-op evaluation 06/13/2020  . Neuroendocrine carcinoma of pancreas ([place]) 05/28/2020  . Trimalleolar fracture of ankle, closed 09/07/2019  . Closed right ankle fracture 09/07/2019  . Chronic prescription benzodiazepine use 04/11/2019  . Lymphocytosis 07/08/2018  . Sensorineural hearing loss (SNHL) of both ears 03/20/2016  . Combined hyperlipidemia associated with type 2 diabetes mellitus ([place]) 05/09/2013  . Menopausal hot flushes 05/09/2013  . HTN (hypertension) 09/07/2012  . Dyslipidemia 09/07/2012  . Type 2 diabetes, diet controlled ([place]) 03/03/2012  . GERD (gastroesophageal reflux disease) 04/28/2011  . Seasonal allergies 04/28/2011  . Vitamin D deficiency 04/28/2011  . Major depression, recurrent, chronic ([place]) 06/14/2010  . PES PLANUS 02/28/2008  . Acquired flat foot 02/28/2008    Consultants None   Imaging: No results found.  Procedures Dr. Michaelle Birks (07/05/20) [facility] Course:  Patient is a 70 year old female who presented to [facility] with a tumor of the pancreas for Whipple procedure.  Patient was admitted and underwent procedure listed above.  Tolerated procedure well and was transferred to the floor.  Diet was advanced as tolerated.  Patient transferred out of ICU on POD#2. Patient noted to have pancreatic leak POD#3 and drains to remain in place.   On POD#7, the patient was voiding well, tolerating diet, ambulating well, pain well controlled, vital signs stable, incisions c/d/i and felt stable for discharge home.  Patient will follow up in our office in 2 weeks and  knows to call with questions or concerns.  She will call to confirm appointment date/time.    Physical Exam: General: resting comfortably, NAD Neuro: alert and oriented, no focal deficits Resp: normal work of breathing on room air Abdomen: soft, nondistended, appropriately tender.  Midline incision c/d/i with no erythema, induration or drainage. RUQ JPx2 with serosanguinous drainage. Extremities: warm and well-perfused  I or a member of my team have reviewed this patient in the Controlled Substance Database.   Allergies as of 07/12/2020      Reactions   Vicodin [hydrocodone-acetaminophen] Nausea And Vomiting, Other (See Comments)   severe   Lisinopril Cough      Medication List    STOP taking these medications   famotidine 20 MG tablet Commonly known as: PEPCID     TAKE these medications   acetaminophen 500 MG tablet Commonly known as: TYLENOL Take 500 mg by mouth daily as needed.   albuterol 108 (90 Base) MCG/ACT inhaler Commonly known as: VENTOLIN HFA Inhale 2 puffs into the lungs every 4 (four) hours as needed.   atorvastatin 40 MG tablet Commonly known as: LIPITOR Take 40 mg by mouth daily.   benzonatate 100 MG capsule Commonly known as: TESSALON Take 100-200 mg by mouth 3 (three) times daily as needed.   blood glucose meter kit and supplies Kit Dispense based on patient and insurance preference. Use up to four times daily as directed.   buPROPion 150 MG 24 hr tablet Commonly known as: WELLBUTRIN XL Take 150 mg by mouth daily.   calcium-vitamin D 500-200 MG-UNIT Tabs tablet Commonly known as: OSCAL WITH D Take 1 tablet by mouth daily.   cetirizine 10 MG tablet Commonly  known as: ZYRTEC Take 1 tablet (10 mg total) by mouth daily. What changed:   when to take this  reasons to take this   clonazePAM 0.5 MG tablet Commonly known as: KLONOPIN Take 0.25-0.5 mg by mouth 2 (two) times daily as needed for anxiety.   docusate sodium 100 MG capsule Commonly  known as: COLACE Take 1 capsule (100 mg total) by mouth daily as needed for mild constipation.   ezetimibe 10 MG tablet Commonly known as: ZETIA Take 10 mg by mouth daily.   feeding supplement Liqd Take 237 mLs by mouth 3 (three) times daily between meals.   glucose blood test strip OneTouch Ultra Blue Test Strip  USE AS DIRECTED TO CHECK BLOOD SUGAR 1 TIME(S)DAILY   losartan 50 MG tablet Commonly known as: COZAAR Take 50 mg by mouth daily.   ondansetron 4 MG disintegrating tablet Commonly known as: ZOFRAN-ODT Take 1 tablet (4 mg total) by mouth every 6 (six) hours as needed for nausea.   OneTouch Delica Plus [id number] Misc OneTouch Delica Plus Lancet 33 gauge  USE AS DIRECTED TO CHECK BLOOD SUGAR 1 TIME(S) DAILY   Oxycodone HCl 10 MG Tabs Take 0.5-1 tablets (5-10 mg total) by mouth every 6 (six) hours as needed for moderate pain, severe pain or breakthrough pain.   pantoprazole 40 MG tablet Commonly known as: PROTONIX Take 1 tablet (40 mg total) by mouth daily at 12 noon.   polyethylene glycol 17 g packet Commonly known as: MIRALAX / GLYCOLAX Take 17 g by mouth once a week.   potassium chloride SA 20 MEQ tablet Commonly known as: KLOR-CON Take 2 tablets (40 mEq total) by mouth 2 (two) times daily.   sucralfate 1 g tablet Commonly known as: CARAFATE Take 1 g by mouth 4 (four) times daily.   venlafaxine 75 MG tablet Commonly known as: EFFEXOR Take 75 mg by mouth 2 (two) times daily.   Vitamin D3 25 MCG (1000 UT) Caps Take 1 capsule by mouth daily.         Follow-up Information    Dwan Bolt, MD. Go on 07/27/2020.   Specialty: General Surgery Why: 10:30 AM. Please arrive 15 min prior to appointment time for check in.  Contact information: [place]. [address] [place] [address] [phone]        Bernerd Limbo, MD. Call.   Specialty: Family Medicine Why: Call and schedule a follow up with your primary care provider to help you with  blood glucose monitoring and management.  Contact information: [address] [place] [address] [phone]               Signed: Norm Parcel , [facility] 07/12/2020, 10:44 AM Please see Amion for pager number during day hours 7:00am-4:30pm

## 2020-07-12 NOTE — Progress Notes (Signed)
Alyssa Armstrong to be D/C'd  per MD order. Discussed with the patient and all questions fully answered.  VSS, Skin clean, dry and intact without evidence of skin break down, no evidence of skin tears noted.  IV catheter discontinued intact. Site without signs and symptoms of complications. Dressing and pressure applied.  An After Visit Summary was printed and given to the patient. Patient received prescription.  D/c education completed with patient/family including follow up instructions, medication list, d/c activities limitations if indicated, with other d/c instructions as indicated by MD - patient able to verbalize understanding, all questions fully answered.   Patient instructed to return to ED, call 911, or call MD for any changes in condition.   Patient to be escorted via [place], and D/C home via private auto.

## 2020-07-12 NOTE — Progress Notes (Signed)
Physical Therapy Treatment Patient Details Name: Alyssa Armstrong MRN: [id number] DOB: March 16, 1950 Today's Date: 07/12/2020    History of Present Illness Pt is a 70 y.o. 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 It was staged as a uT2N0. PMH: bil ear sensorineural hearing loss, lymphocytosis, HTN, GERD, DM, depression, bornchitis, arthritis, chronic prescription benzodiazephine use, and anxiety.    PT Comments    The pt is making great progress with stability and mobility this session. She was able to progress to 150 ft of hallway ambulation without use of AD or need for assist/support. The pt did demo slight increase in lateral sway initially, but was able to maintain slow but steady gait without AD. The pt was also able to complete x2 stairs with single UE support on rail without issue. Discussed HEP of progressive walking program, use of RW vs no AD at home, and fall risk reduction with pt and spouse at length during session with both expressing understanding. The pt will be safe to return home with family support at this time. Will continue to benefit from skilled PT acutely to further progress dynamic stability, endurance, and HEP, but is safe to d/c home without HHPT services given current level of mobility.     Follow Up Recommendations  No PT follow up;Supervision - Intermittent     Equipment Recommendations  None recommended by PT    Recommendations for Other Services       Precautions / Restrictions Precautions Precautions: Fall Precaution Comments: x2 R pleural drains Restrictions Weight Bearing Restrictions: No    Mobility  Bed Mobility Overal bed mobility: Modified Independent Bed Mobility: Rolling;Sidelying to Sit Rolling: Modified independent (Device/Increase time)         General bed mobility comments: No physical assist required to progress to  edge of bed    Transfers Overall transfer level: Modified independent Equipment used: None Transfers: Sit to/from Stand Sit to Stand: Supervision         General transfer comment: supervision for safety, able to power up and steady without assist  Ambulation/Gait Ambulation/Gait assistance: Min guard;Supervision Gait Distance (Feet): 150 Feet Assistive device: Rolling walker (2 wheeled);None Gait Pattern/deviations: Step-through pattern;Decreased stride length Gait velocity: 0.4 m/s Gait velocity interpretation: 1.31 - 2.62 ft/sec, indicative of limited community ambulator General Gait Details: pt able to progress to minG witout use of RW. increased lateral movement, but able to steady without assist. maintain good walking pace   Stairs Stairs: Yes Stairs assistance: Min guard Stair Management: One rail Left;Step to pattern;Forwards Number of Stairs: 2 General stair comments: no evidence of instability without assist when using single rail   Wheelchair Mobility    Modified Rankin (Stroke Patients Only)       Balance Overall balance assessment: Needs assistance Sitting-balance support: Feet supported;No upper extremity supported Sitting balance-Leahy Scale: Good     Standing balance support: No upper extremity supported;During functional activity Standing balance-Leahy Scale: Fair Standing balance comment: increased lateral movement, no overt LOB                            Cognition Arousal/Alertness: Awake/alert Behavior During Therapy: WFL for tasks assessed/performed Overall Cognitive Status: Within Functional Limits for tasks assessed  General Comments: pt agreeable to all education, able to verbalize understanding of safety awareness      Exercises      General Comments General comments (skin integrity, edema, etc.): VSS on RA      Pertinent Vitals/Pain Pain Assessment: Faces Faces Pain  Scale: Hurts a little bit Pain Location: abdomen Pain Descriptors / Indicators: Discomfort;Grimacing;Guarding Pain Intervention(s): Limited activity within patient's tolerance;Monitored during session;Repositioned     PT Goals (current goals can now be found in the care plan section) Acute Rehab PT Goals Patient Stated Goal: to return to ambulation without RW PT Goal Formulation: With patient Time For Goal Achievement: 07/20/20 Potential to Achieve Goals: Good Progress towards PT goals: Progressing toward goals    Frequency    Min 3X/week      PT Plan Current plan remains appropriate       AM-PAC PT "6 Clicks" Mobility   Outcome Measure  Help needed turning from your back to your side while in a flat bed without using bedrails?: None Help needed moving from lying on your back to sitting on the side of a flat bed without using bedrails?: None Help needed moving to and from a bed to a chair (including a wheelchair)?: A Little Help needed standing up from a chair using your arms (e.g., wheelchair or bedside chair)?: None Help needed to walk in hospital room?: A Little Help needed climbing 3-5 steps with a railing? : A Little 6 Click Score: 21    End of Session   Activity Tolerance: Patient tolerated treatment well Patient left: with call bell/phone within reach;in bed;with family/visitor present (sitting EOB) Nurse Communication: Mobility status PT Visit Diagnosis: Unsteadiness on feet (R26.81);Other abnormalities of gait and mobility (R26.89);Difficulty in walking, not elsewhere classified (R26.2);Pain     Time: 3817-7116 PT Time Calculation (min) (ACUTE ONLY): 23 min  Charges:  $Gait Training: 23-37 mins                     Karma Ganja, PT, DPT   Acute Rehabilitation Department Pager #: [phone]   Otho Bellows 07/12/2020, 10:47 AM

## 2020-07-12 NOTE — Plan of Care (Signed)
  Problem: Education: Goal: Knowledge of General Education information will improve Description: Including pain rating scale, medication(s)/side effects and non-pharmacologic comfort measures Outcome: Progressing   Problem: Health Behavior/Discharge Planning: Goal: Ability to manage health-related needs will improve Outcome: Progressing   Problem: Clinical Measurements: Goal: Ability to maintain clinical measurements within normal limits will improve Outcome: Progressing Goal: Cardiovascular complication will be avoided Outcome: Progressing   Problem: Activity: Goal: Risk for activity intolerance will decrease Outcome: Progressing   Problem: Nutrition: Goal: Adequate nutrition will be maintained Outcome: Progressing   Problem: Coping: Goal: Level of anxiety will decrease Outcome: Progressing   Problem: Elimination: Goal: Will not experience complications related to bowel motility Outcome: Progressing Goal: Will not experience complications related to urinary retention Outcome: Progressing   Problem: Pain Managment: Goal: General experience of comfort will improve Outcome: Progressing   Problem: Safety: Goal: Ability to remain free from injury will improve Outcome: Progressing   Problem: Skin Integrity: Goal: Risk for impaired skin integrity will decrease Outcome: Progressing

## 2020-07-12 NOTE — Plan of Care (Signed)
  Problem: Education: Goal: Knowledge of General Education information will improve Description: Including pain rating scale, medication(s)/side effects and non-pharmacologic comfort measures 07/12/2020 1031 by Camillia Herter, RN Outcome: Adequate for Discharge 07/12/2020 1030 by Camillia Herter, RN Outcome: Progressing   Problem: Health Behavior/Discharge Planning: Goal: Ability to manage health-related needs will improve 07/12/2020 1031 by Camillia Herter, RN Outcome: Adequate for Discharge 07/12/2020 1030 by Julius Bowels A, RN Outcome: Progressing   Problem: Clinical Measurements: Goal: Ability to maintain clinical measurements within normal limits will improve 07/12/2020 1031 by Camillia Herter, RN Outcome: Adequate for Discharge 07/12/2020 1030 by Julius Bowels A, RN Outcome: Progressing Goal: Cardiovascular complication will be avoided 07/12/2020 1031 by Camillia Herter, RN Outcome: Adequate for Discharge 07/12/2020 1030 by Camillia Herter, RN Outcome: Progressing   Problem: Activity: Goal: Risk for activity intolerance will decrease 07/12/2020 1031 by Camillia Herter, RN Outcome: Adequate for Discharge 07/12/2020 1030 by Julius Bowels A, RN Outcome: Progressing   Problem: Nutrition: Goal: Adequate nutrition will be maintained 07/12/2020 1031 by Camillia Herter, RN Outcome: Adequate for Discharge 07/12/2020 1030 by Camillia Herter, RN Outcome: Progressing   Problem: Coping: Goal: Level of anxiety will decrease 07/12/2020 1031 by Camillia Herter, RN Outcome: Adequate for Discharge 07/12/2020 1030 by Julius Bowels A, RN Outcome: Progressing   Problem: Elimination: Goal: Will not experience complications related to bowel motility 07/12/2020 1031 by Camillia Herter, RN Outcome: Adequate for Discharge 07/12/2020 1030 by Julius Bowels A, RN Outcome: Progressing Goal: Will not experience complications related to urinary retention 07/12/2020 1031 by Camillia Herter, RN Outcome: Adequate for Discharge 07/12/2020 1030 by Camillia Herter, RN Outcome: Progressing   Problem: Pain Managment: Goal: General experience of comfort will improve 07/12/2020 1031 by Camillia Herter, RN Outcome: Adequate for Discharge 07/12/2020 1030 by Julius Bowels A, RN Outcome: Progressing   Problem: Safety: Goal: Ability to remain free from injury will improve 07/12/2020 1031 by Camillia Herter, RN Outcome: Adequate for Discharge 07/12/2020 1030 by Julius Bowels A, RN Outcome: Progressing   Problem: Skin Integrity: Goal: Risk for impaired skin integrity will decrease 07/12/2020 1031 by Camillia Herter, RN Outcome: Adequate for Discharge 07/12/2020 1030 by Camillia Herter, RN Outcome: Progressing

## 2020-07-13 LAB — BPAM RBC
Blood Product Expiration Date: 202206152359
Blood Product Expiration Date: 202206152359
Blood Product Expiration Date: 202206152359
Blood Product Expiration Date: 202206152359
Unit Type and Rh: 5100
Unit Type and Rh: 5100
Unit Type and Rh: 5100
Unit Type and Rh: 5100

## 2020-07-13 LAB — TYPE AND SCREEN
ABO/RH(D): O POS
Antibody Screen: NEGATIVE
Unit division: 0
Unit division: 0
Unit division: 0
Unit division: 0

## 2020-08-01 NOTE — Progress Notes (Deleted)
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 She was initially seen on 05/28/20. She was taken to the OR by Dr. Zenia Resides for whipple surgery on 07/05/20. Post-op course was mostly uneventful except pancreatic leak and was discharged on POD 8 with drains in place. Path showed well differentiated neuroendocrine tumor, 8 LNs negative, clear margins. Mitotic rate is < 2 per 10 high power field, Ki-67 < 3% . This was staged pT2N0.    REVIEW OF SYSTEMS:   Constitutional: Denies fevers, chills or abnormal weight loss Eyes: Denies blurriness of vision Ears, nose, mouth, throat, and face: Denies mucositis or sore throat Respiratory: Denies cough, dyspnea or wheezes Cardiovascular: Denies palpitation, chest discomfort or lower extremity swelling Gastrointestinal:  Denies nausea, heartburn or change in bowel habits Skin: Denies abnormal skin rashes Lymphatics: Denies new lymphadenopathy or  easy bruising Neurological:Denies numbness, tingling or new weaknesses Behavioral/Psych: Mood is stable, no new changes  All other systems were reviewed with the patient and are negative.  MEDICAL HISTORY:  Past Medical History:  Diagnosis Date   Anxiety    Arthritis    Bronchitis    Chronic prescription benzodiazepine use    Combined hyperlipidemia associated with type 2 diabetes mellitus ([place])    Depression 25YRS AGO   Diabetes mellitus without complication (HCC)    GERD (gastroesophageal reflux disease)    Hypertension    Lymphocytosis    present since at least 2012, Assoc with mild red cell microcytosis   Seasonal allergies    Sensorineural hearing loss (SNHL) of both ears    Vitamin D deficiency     SURGICAL HISTORY: Past Surgical History:  Procedure Laterality Date   ABDOMINAL HYSTERECTOMY     PARTIAL, have ovaries   BIOPSY  05/17/2020   Procedure: BIOPSY;  Surgeon: Irving Copas., MD;  Location: [facility];  Service: Gastroenterology;;   Lillard Anes Left 2009   ESOPHAGOGASTRODUODENOSCOPY (EGD) WITH PROPOFOL N/A 05/17/2020   Procedure: ESOPHAGOGASTRODUODENOSCOPY (EGD) WITH PROPOFOL;  Surgeon: Irving Copas., MD;  Location: [place];  Service: Gastroenterology;  Laterality: N/A;   EUS N/A 05/17/2020   Procedure: UPPER ENDOSCOPIC ULTRASOUND (EUS) RADIAL;  Surgeon: Irving Copas., MD;  Location: [place];  Service: Gastroenterology;  Laterality: N/A;   EYE SURGERY     bilateral cataract   FINE NEEDLE ASPIRATION  05/17/2020   Procedure: FINE NEEDLE ASPIRATION (FNA) LINEAR;  Surgeon: Irving Copas., MD;  Location: [place];  Service: Gastroenterology;;   ORIF ANKLE FRACTURE Bilateral 09/07/2019   Procedure: OPEN REDUCTION  INTERNAL FIXATION (ORIF), RIGHT MALLEOLAR ANKLE FRACTURE;  Surgeon: Marybelle Killings, MD;  Location: [facility] ORS;  Service: Orthopedics;  Laterality: Bilateral;   ROTATOR CUFF REPAIR Right 2008   TONSILLECTOMY     as  a child   WHIPPLE PROCEDURE N/A 07/05/2020   Procedure: WHIPPLE PROCEDURE;  Surgeon: Dwan Bolt, MD;  Location: [place];  Service: General;  Laterality: N/A;    I have reviewed the social history and family history with the patient and they are unchanged from previous note.  ALLERGIES:  is allergic to vicodin [hydrocodone-acetaminophen] and lisinopril.  MEDICATIONS:  Current Outpatient Medications  Medication Sig Dispense Refill   acetaminophen (TYLENOL) 500 MG tablet Take 500 mg by mouth daily as needed.     albuterol (VENTOLIN HFA) 108 (90 Base) MCG/ACT inhaler Inhale 2 puffs into the lungs every 4 (four) hours as needed.     atorvastatin (LIPITOR) 40 MG tablet Take 40 mg by mouth daily.     benzonatate (TESSALON) 100 MG capsule Take 100-200 mg by mouth 3 (three) times daily as needed.     blood glucose meter kit and supplies KIT Dispense based on patient and insurance preference. Use up to four times daily as directed. 1 each 0   buPROPion (WELLBUTRIN XL) 150 MG 24 hr tablet Take 150 mg by mouth daily.     calcium-vitamin D (OSCAL WITH D) 500-200 MG-UNIT TABS tablet Take 1 tablet by mouth daily.     cetirizine (ZYRTEC) 10 MG tablet Take 1 tablet (10 mg total) by mouth daily. (Patient taking differently: Take 10 mg by mouth daily as needed for allergies.) 30 tablet 3   Cholecalciferol (VITAMIN D3) 25 MCG (1000 UT) CAPS Take 1 capsule by mouth daily.     clonazePAM (KLONOPIN) 0.5 MG tablet Take 0.25-0.5 mg by mouth 2 (two) times daily as needed for anxiety.     docusate sodium (COLACE) 100 MG capsule Take 1 capsule (100 mg total) by mouth daily as needed for mild constipation.     ezetimibe (ZETIA) 10 MG tablet Take 10 mg by mouth daily.     feeding supplement (ENSURE ENLIVE / ENSURE PLUS) LIQD Take 237 mLs by mouth 3 (three) times daily between meals. 237 mL 12   glucose blood test strip OneTouch Ultra Blue Test Strip  USE AS DIRECTED TO CHECK BLOOD SUGAR 1 TIME(S)DAILY     Lancets  (ONETOUCH DELICA PLUS [id number]) MISC OneTouch Delica Plus Lancet 33 gauge  USE AS DIRECTED TO CHECK BLOOD SUGAR 1 TIME(S) DAILY     losartan (COZAAR) 50 MG tablet Take 50 mg by mouth daily.     ondansetron (ZOFRAN-ODT) 4 MG disintegrating tablet Take 1 tablet (4 mg total) by mouth every 6 (six) hours as needed for nausea. 20 tablet 0   oxyCODONE 10 MG TABS Take 0.5-1 tablets (5-10 mg total) by mouth every 6 (six) hours as needed for moderate pain, severe pain or breakthrough pain. 30 tablet 0   pantoprazole (PROTONIX) 40 MG tablet Take 1 tablet (40 mg total) by mouth daily at 12 noon. 30 tablet 0   polyethylene glycol (MIRALAX / GLYCOLAX) packet Take 17 g by mouth once a week.     potassium chloride SA (KLOR-CON) 20 MEQ tablet Take 2 tablets (40 mEq total) by mouth 2 (two) times daily. 30 tablet 0   sucralfate (CARAFATE) 1 g tablet Take 1 g by mouth 4 (four) times daily.     venlafaxine (EFFEXOR) 75 MG tablet Take  75 mg by mouth 2 (two) times daily.     No current facility-administered medications for this visit.    PHYSICAL EXAMINATION: ECOG PERFORMANCE STATUS: {CHL ONC ECOG PS:[phone]}  There were no vitals filed for this visit. There were no vitals filed for this visit.  GENERAL:alert, no distress and comfortable SKIN: skin color, texture, turgor are normal, no rashes or significant lesions EYES: normal, Conjunctiva are pink and non-injected, sclera clear OROPHARYNX:no exudate, no erythema and lips, buccal mucosa, and tongue normal  NECK: supple, thyroid normal size, non-tender, without nodularity LYMPH:  no palpable lymphadenopathy in the cervical, axillary or inguinal LUNGS: clear to auscultation and percussion with normal breathing effort HEART: regular rate & rhythm and no murmurs and no lower extremity edema ABDOMEN:abdomen soft, non-tender and normal bowel sounds Musculoskeletal:no cyanosis of digits and no clubbing  NEURO: alert & oriented x 3 with fluent speech, no focal  motor/sensory deficits  LABORATORY DATA:  I have reviewed the data as listed CBC Latest Ref Rng & Units 07/12/2020 07/11/2020 07/10/2020  WBC 4.0 - 10.5 K/uL 12.2(H) 12.8(H) 12.1(H)  Hemoglobin 12.0 - 15.0 g/dL 8.9(L) 9.1(L) 8.7(L)  Hematocrit 36.0 - 46.0 % 26.1(L) 26.4(L) 25.0(L)  Platelets 150 - 400 K/uL 321 288 243     CMP Latest Ref Rng & Units 07/12/2020 07/11/2020 07/10/2020  Glucose 70 - 99 mg/dL 128(H) 121(H) 129(H)  BUN 8 - 23 mg/dL 6(L) 6(L) 6(L)  Creatinine 0.44 - 1.00 mg/dL 0.93 0.90 0.93  Sodium 135 - 145 mmol/L 134(L) 139 139  Potassium 3.5 - 5.1 mmol/L 3.7 3.5 3.1(L)  Chloride 98 - 111 mmol/L 103 107 104  CO2 22 - 32 mmol/L 23 24 25   Calcium 8.9 - 10.3 mg/dL 8.2(L) 8.2(L) 8.3(L)  Total Protein 6.5 - 8.1 g/dL 5.8(L) 5.7(L) 5.8(L)  Total Bilirubin 0.3 - 1.2 mg/dL 0.6 0.8 0.9  Alkaline Phos 38 - 126 U/L 183(H) 141(H) 119  AST 15 - 41 U/L 43(H) 39 29  ALT 0 - 44 U/L 35 34 32      RADIOGRAPHIC STUDIES: I have personally reviewed the radiological images as listed and agreed with the findings in the report. No results found.   ASSESSMENT & PLAN:  No problem-specific Assessment & Plan notes found for this encounter.   No orders of the defined types were placed in this encounter.  All questions were answered. The patient knows to call the clinic with any problems, questions or concerns. No barriers to learning was detected. I spent {CHL ONC TIME VISIT - UTMLY:[id number]} counseling the patient face to face. The total time spent in the appointment was {CHL ONC TIME VISIT - LEXNT:[id number]} and more than 50% was on counseling and review of test results     Alla Feeling, NP 08/01/20

## 2020-08-02 ENCOUNTER — Telehealth: Payer: Self-pay

## 2020-08-02 ENCOUNTER — Telehealth: Payer: Self-pay | Admitting: Physician Assistant

## 2020-08-02 ENCOUNTER — Inpatient Hospital Stay: Payer: Medicare Other | Admitting: Nurse Practitioner

## 2020-08-02 NOTE — Telephone Encounter (Signed)
I spoke with pt regarding her missed appt with Regan Rakers, NP today. She indicates she forgot about this appt but would like to r/s.  I have sent a schedule message to contact the pt.

## 2020-08-02 NOTE — Telephone Encounter (Signed)
Scheduled appointment per 06/16 sch msg. Left message. 

## 2020-08-07 ENCOUNTER — Telehealth: Payer: Self-pay | Admitting: Nurse Practitioner

## 2020-08-07 ENCOUNTER — Ambulatory Visit: Payer: Medicare Other | Admitting: Nurse Practitioner

## 2020-08-07 NOTE — Telephone Encounter (Signed)
I called patient after second no-show to discuss surveillance plan. No answer, I left message/phone # to call back to reschedule. I sent a message to Dr. Zenia Resides and Dr. Rush Landmark to update them as well.   Cira Rue, NP

## 2020-10-30 ENCOUNTER — Other Ambulatory Visit: Payer: Self-pay | Admitting: Surgery

## 2020-10-30 DIAGNOSIS — D3A8 Other benign neuroendocrine tumors: Secondary | ICD-10-CM

## 2020-11-20 ENCOUNTER — Ambulatory Visit
Admission: RE | Admit: 2020-11-20 | Discharge: 2020-11-20 | Disposition: A | Discharge status: Home / Self Care | Payer: Medicare Other | Source: Ambulatory Visit | Attending: Surgery | Admitting: Surgery

## 2020-11-20 DIAGNOSIS — D3A8 Other benign neuroendocrine tumors: Secondary | ICD-10-CM

## 2020-11-20 MED ORDER — IOPAMIDOL (ISOVUE-300) INJECTION 61%
100.0000 mL | Freq: Once | INTRAVENOUS | Status: AC | PRN
  Administered 2020-11-20: 100 mL via INTRAVENOUS

## 2021-02-26 ENCOUNTER — Other Ambulatory Visit: Payer: Self-pay | Admitting: Family Medicine

## 2021-02-26 ENCOUNTER — Ambulatory Visit
Admission: RE | Admit: 2021-02-26 | Discharge: 2021-02-26 | Disposition: A | Discharge status: Home / Self Care | Payer: Medicare Other | Source: Ambulatory Visit | Attending: Family Medicine | Admitting: Family Medicine

## 2021-02-26 DIAGNOSIS — Z139 Encounter for screening, unspecified: Secondary | ICD-10-CM

## 2021-06-23 ENCOUNTER — Emergency Department (HOSPITAL_COMMUNITY)
Admission: EM | Admit: 2021-06-23 | Discharge: 2021-06-24 | Disposition: A | Discharge status: Home / Self Care | Payer: Medicare Other | Attending: Emergency Medicine | Admitting: Emergency Medicine

## 2021-06-23 ENCOUNTER — Encounter (HOSPITAL_COMMUNITY): Payer: Self-pay | Admitting: Emergency Medicine

## 2021-06-23 ENCOUNTER — Emergency Department (HOSPITAL_COMMUNITY): Payer: Medicare Other

## 2021-06-23 ENCOUNTER — Other Ambulatory Visit: Payer: Self-pay

## 2021-06-23 DIAGNOSIS — J988 Other specified respiratory disorders: Secondary | ICD-10-CM | POA: Diagnosis not present

## 2021-06-23 DIAGNOSIS — Z79899 Other long term (current) drug therapy: Secondary | ICD-10-CM | POA: Insufficient documentation

## 2021-06-23 DIAGNOSIS — Z87891 Personal history of nicotine dependence: Secondary | ICD-10-CM | POA: Insufficient documentation

## 2021-06-23 DIAGNOSIS — B9789 Other viral agents as the cause of diseases classified elsewhere: Secondary | ICD-10-CM | POA: Insufficient documentation

## 2021-06-23 DIAGNOSIS — Z20822 Contact with and (suspected) exposure to covid-19: Secondary | ICD-10-CM | POA: Insufficient documentation

## 2021-06-23 DIAGNOSIS — E119 Type 2 diabetes mellitus without complications: Secondary | ICD-10-CM | POA: Insufficient documentation

## 2021-06-23 DIAGNOSIS — I10 Essential (primary) hypertension: Secondary | ICD-10-CM | POA: Diagnosis not present

## 2021-06-23 DIAGNOSIS — R059 Cough, unspecified: Secondary | ICD-10-CM | POA: Diagnosis present

## 2021-06-23 LAB — BASIC METABOLIC PANEL
Anion gap: 8 (ref 5–15)
BUN: 13 mg/dL (ref 8–23)
CO2: 27 mmol/L (ref 22–32)
Calcium: 8.9 mg/dL (ref 8.9–10.3)
Chloride: 105 mmol/L (ref 98–111)
Creatinine, Ser: 1.1 mg/dL — ABNORMAL HIGH (ref 0.44–1.00)
GFR, Estimated: 54 mL/min — ABNORMAL LOW (ref >60)
Glucose, Bld: 115 mg/dL — ABNORMAL HIGH (ref 70–99)
Potassium: 3.8 mmol/L (ref 3.5–5.1)
Sodium: 140 mmol/L (ref 135–145)

## 2021-06-23 LAB — RESP PANEL BY RT-PCR (FLU A&B, COVID) ARPGX2
Influenza A by PCR: NEGATIVE
Influenza B by PCR: NEGATIVE
SARS Coronavirus 2 by RT PCR: NEGATIVE

## 2021-06-23 NOTE — ED Triage Notes (Signed)
Patient c/o cough and chills with headache x3 days. ?

## 2021-06-23 NOTE — ED Provider Triage Note (Signed)
Emergency Medicine Provider Triage Evaluation Note ? ?Aloha Bartok , a 71 y.o. female  was evaluated in triage.  Pt complains of cough, rhinorrhea, sore throat, shortness of breath that started yesterday.  Patient states she had mild shortness of breath overnight.  States that her remaining symptoms have persisted.  States that she has been vaccinated and boosted for COVID-19 but denies any known history of previous COVID-19 infection. ? ?Physical Exam  ?BP (!) 152/80 (BP Location: Right Arm)   Pulse 85   Temp 99.7 ?F (37.6 ?C) (Oral)   Resp 16   Ht '5\' 4"'$  (1.626 m)   Wt 82.1 kg   SpO2 (!) 9%   BMI 31.07 kg/m?  ?Gen:   Awake, no distress   ?Resp:  Normal effort  ?MSK:   Moves extremities without difficulty  ?Other:   ? ?Medical Decision Making  ?Medically screening exam initiated at 8:34 PM.  Appropriate orders placed.  Edom Schmuhl was informed that the remainder of the evaluation will be completed by another provider, this initial triage assessment does not replace that evaluation, and the importance of remaining in the ED until their evaluation is complete. ?  ?Rayna Sexton, PA-C ?06/23/21 2035 ? ?

## 2021-06-24 ENCOUNTER — Other Ambulatory Visit: Payer: Self-pay | Admitting: Surgery

## 2021-06-24 DIAGNOSIS — D3A8 Other benign neuroendocrine tumors: Secondary | ICD-10-CM

## 2021-06-24 MED ORDER — AEROCHAMBER PLUS FLO-VU MISC
1.0000 | Freq: Once | Status: AC
  Administered 2021-06-24: 1
  Filled 2021-06-24: qty 1

## 2021-06-24 MED ORDER — OXYMETAZOLINE HCL 0.05 % NA SOLN
2.0000 | Freq: Two times a day (BID) | NASAL | Status: DC | PRN
  Administered 2021-06-24: 2 via NASAL
  Filled 2021-06-24: qty 30

## 2021-06-24 NOTE — ED Provider Notes (Signed)
? ?[facility] DEPT ?Provider Note: Georgena Spurling, MD, [place] ? ?CSN: [id number] ?MRN: [id number] ?ARRIVAL: 06/23/21 at 2008 ?ROOM: WA18/[id number] ? ? ?CHIEF COMPLAINT  ?Cough ? ? ?HISTORY OF PRESENT ILLNESS  ?06/24/21 12:14 AM ?Alyssa Armstrong is a 71 y.o. female with cough, chills and headache for the past 2 days.  She has had associated generalized weakness and malaise.  Shortness of breath is worse with exertion or lying flat.  She is having pain in her chest when she coughs or takes a deep breath.  She was noted to have a low-grade fever on arrival.  She has an inhaler but has not been using it.  She denies nausea or vomiting but has had some loose stools. ? ? ?Past Medical History:  ?Diagnosis Date  ? Anxiety   ? Arthritis   ? Bronchitis   ? Chronic prescription benzodiazepine use   ? Combined hyperlipidemia associated with type 2 diabetes mellitus ([place])   ? Depression 53YRS AGO  ? Diabetes mellitus without complication ([place])   ? GERD (gastroesophageal reflux disease)   ? Hypertension   ? Lymphocytosis   ? present since at least 2012, Ass[facility] with mild red cell microcytosis  ? Seasonal allergies   ? Sensorineural hearing loss (SNHL) of both ears   ? Vitamin D deficiency   ? ? ?Past Surgical History:  ?Procedure Laterality Date  ? ABDOMINAL HYSTERECTOMY    ? PARTIAL, have ovaries  ? BIOPSY  05/17/2020  ? Procedure: BIOPSY;  Surgeon: Irving Copas., MD;  Location: [facility];  Service: Gastroenterology;;  ? BUNIONECTOMY Left 2009  ? ESOPHAGOGASTRODUODENOSCOPY (EGD) WITH PROPOFOL N/A 05/17/2020  ? Procedure: ESOPHAGOGASTRODUODENOSCOPY (EGD) WITH PROPOFOL;  Surgeon: Rush Landmark Telford Nab., MD;  Location: [facility];  Service: Gastroenterology;  Laterality: N/A;  ? EUS N/A 05/17/2020  ? Procedure: UPPER ENDOSCOPIC ULTRASOUND (EUS) RADIAL;  Surgeon: Rush Landmark Telford Nab., MD;  Location: [facility];  Service: Gastroenterology;  Laterality: N/A;  ? EYE SURGERY    ? bilateral cataract  ? FINE NEEDLE  ASPIRATION  05/17/2020  ? Procedure: FINE NEEDLE ASPIRATION (FNA) LINEAR;  Surgeon: Irving Copas., MD;  Location: [facility];  Service: Gastroenterology;;  ? ORIF ANKLE FRACTURE Bilateral 09/07/2019  ? Procedure: OPEN REDUCTION INTERNAL FIXATION (ORIF), RIGHT MALLEOLAR ANKLE FRACTURE;  Surgeon: Marybelle Killings, MD;  Location: [facility];  Service: Orthopedics;  Laterality: Bilateral;  ? ROTATOR CUFF REPAIR Right 2008  ? TONSILLECTOMY    ? as a child  ? WHIPPLE PROCEDURE N/A 07/05/2020  ? Procedure: WHIPPLE PROCEDURE;  Surgeon: Dwan Bolt, MD;  Location: [facility];  Service: General;  Laterality: N/A;  ? ? ?Family History  ?Problem Relation Age of Onset  ? Diabetes Mother   ? Heart disease Mother   ? Diabetes Father   ? Lung cancer Father   ? Hyperlipidemia Brother   ? Clotting disorder Sister   ? Hypertension Sister   ? Asthma Daughter   ? ? ?Social History  ? ?Tobacco Use  ? Smoking status: Former  ?  Packs/day: 0.25  ?  Types: Cigarettes  ?  Quit date: 1970  ?  Years since quitting: 53.3  ? Smokeless tobacco: Never  ? Tobacco comments:  ?  2 month pt was smoking for  ?Vaping Use  ? Vaping Use: Never used  ?Substance Use Topics  ? Alcohol use: Yes  ?  Comment: beer or wine occasional  ? Drug use: No  ? ? ?Prior to Admission medications   ?  Medication Sig Start Date End Date Taking? Authorizing Provider  ?acetaminophen (TYLENOL) 500 MG tablet Take 500 mg by mouth daily as needed.    [provider]  ?albuterol (VENTOLIN HFA) 108 (90 Base) MCG/ACT inhaler Inhale 2 puffs into the lungs every 4 (four) hours as needed. 03/22/20   [provider]  ?atorvastatin (LIPITOR) 40 MG tablet Take 40 mg by mouth daily. 07/28/19   [provider]  ?benzonatate (TESSALON) 100 MG capsule Take 100-200 mg by mouth 3 (three) times daily as needed. 03/22/20   [provider]  ?blood glucose meter kit and supplies KIT Dispense based on patient and insurance preference. Use up to four times daily as  directed. 07/12/20   Norm Parcel, PA-C  ?buPROPion (WELLBUTRIN XL) 150 MG 24 hr tablet Take 150 mg by mouth daily. 09/17/15 04/12/20  [provider]  ?calcium-vitamin D (OSCAL WITH D) 500-200 MG-UNIT TABS tablet Take 1 tablet by mouth daily.    [provider]  ?cetirizine (ZYRTEC) 10 MG tablet Take 1 tablet (10 mg total) by mouth daily. ?Patient taking differently: Take 10 mg by mouth daily as needed for allergies. 09/07/12   Robbie Lis, MD  ?Cholecalciferol (VITAMIN D3) 25 MCG (1000 UT) CAPS Take 1 capsule by mouth daily.    [provider]  ?clonazePAM (KLONOPIN) 0.5 MG tablet Take 0.25-0.5 mg by mouth 2 (two) times daily as needed for anxiety. 07/19/19   [provider]  ?docusate sodium (COLACE) 100 MG capsule Take 1 capsule (100 mg total) by mouth daily as needed for mild constipation. 07/12/20   Norm Parcel, PA-C  ?ezetimibe (ZETIA) 10 MG tablet Take 10 mg by mouth daily. 07/06/19   [provider]  ?feeding supplement (ENSURE ENLIVE / ENSURE PLUS) LIQD Take 237 mLs by mouth 3 (three) times daily between meals. 07/12/20   Norm Parcel, PA-C  ?glucose blood test strip OneTouch Ultra Blue Test Strip ? USE AS DIRECTED TO CHECK BLOOD SUGAR 1 TIME(S)DAILY    [provider]  ?Lancets (ONETOUCH DELICA PLUS [id number]) [place] OneTouch Delica Plus Lancet 33 gauge ? USE AS DIRECTED TO CHECK BLOOD SUGAR 1 TIME(S) DAILY    [provider]  ?losartan (COZAAR) 50 MG tablet Take 50 mg by mouth daily. 07/07/19   [provider]  ?ondansetron (ZOFRAN-ODT) 4 MG disintegrating tablet Take 1 tablet (4 mg total) by mouth every 6 (six) hours as needed for nausea. 07/10/20   Stark Klein, MD  ?oxyCODONE 10 MG TABS Take 0.5-1 tablets (5-10 mg total) by mouth every 6 (six) hours as needed for moderate pain, severe pain or breakthrough pain. 07/12/20   Norm Parcel, PA-C  ?pantoprazole (PROTONIX) 40 MG tablet Take 1 tablet (40 mg total) by mouth daily at  12 noon. 07/11/20   Stark Klein, MD  ?polyethylene glycol (MIRALAX / GLYCOLAX) packet Take 17 g by mouth once a week.    [provider]  ?potassium chloride SA (KLOR-CON) 20 MEQ tablet Take 2 tablets (40 mEq total) by mouth 2 (two) times daily. 07/10/20   Stark Klein, MD  ?sucralfate (CARAFATE) 1 g tablet Take 1 g by mouth 4 (four) times daily. 03/22/20   [provider]  ?venlafaxine (EFFEXOR) 75 MG tablet Take 75 mg by mouth 2 (two) times daily. 08/22/15   [provider]  ? ? ?Allergies ?Vicodin [hydrocodone-acetaminophen] and Lisinopril ? ? ?REVIEW OF SYSTEMS  ?Negative except as noted here or in the History of  Present Illness. ? ? ?PHYSICAL EXAMINATION  ?Initial Vital Signs ?Blood pressure 139/74, pulse 71, temperature 99.7 ?F (37.6 ?C), temperature source Oral, resp. rate 16, height 5' 4"  (1.626 m), weight 82.1 kg, SpO2 99 %. ? ?Examination ?General: Well-developed, well-nourished female in no acute distress; appearance consistent with age of record ?HENT: normocephalic; atraumatic; nasal congestion ?Eyes: Normal appearance ?Neck: supple ?Heart: regular rate and rhythm ?Lungs: Decreased breath sounds bilaterally without wheezing ?Abdomen: soft; nondistended; nontender; bowel sounds present ?Extremities: No deformity; full range of motion ?Neurologic: Awake, alert and oriented; motor function intact in all extremities and symmetric; no facial droop ?Skin: Warm and dry ?Psychiatric: Normal mood and affect ? ? ?RESULTS  ?Summary of this visit's results, reviewed and interpreted by myself: ? ? EKG Interpretation ? ?Date/Time:    ?Ventricular Rate:    ?PR Interval:    ?QRS Duration:   ?QT Interval:    ?QTC Calculation:   ?R Axis:     ?Text Interpretation:   ?  ? ?  ? ?Laboratory Studies: ?Results for orders placed or performed during the hospital encounter of 06/23/21 (from the past 24 hour(s))  ?Resp Panel by RT-PCR (Flu A&B, Covid) Nasopharyngeal Swab     Status: None  ? Collection Time:  06/23/21 10:37 PM  ? Specimen: Nasopharyngeal Swab; Nasopharyngeal(NP) swabs in vial transport medium  ?Result Value Ref Range  ? SARS Coronavirus 2 by RT PCR NEGATIVE NEGATIVE  ? Influenza A by PCR NEGATIVE NEGA

## 2021-07-18 ENCOUNTER — Ambulatory Visit
Admission: RE | Admit: 2021-07-18 | Discharge: 2021-07-18 | Disposition: A | Discharge status: Home / Self Care | Payer: Medicare Other | Source: Ambulatory Visit | Attending: Surgery | Admitting: Surgery

## 2021-07-18 ENCOUNTER — Other Ambulatory Visit: Payer: Self-pay | Admitting: Oral Surgery

## 2021-07-18 DIAGNOSIS — D3A8 Other benign neuroendocrine tumors: Secondary | ICD-10-CM

## 2021-07-18 MED ORDER — IOPAMIDOL (ISOVUE-300) INJECTION 61%
100.0000 mL | Freq: Once | INTRAVENOUS | Status: AC | PRN
  Administered 2021-07-18: 100 mL via INTRAVENOUS

## 2022-04-26 ENCOUNTER — Emergency Department (HOSPITAL_COMMUNITY)
Admission: EM | Admit: 2022-04-26 | Discharge: 2022-04-26 | Disposition: A | Discharge status: Home / Self Care | Payer: Medicare Other | Attending: Emergency Medicine | Admitting: Emergency Medicine

## 2022-04-26 ENCOUNTER — Emergency Department (HOSPITAL_COMMUNITY): Payer: Medicare Other

## 2022-04-26 ENCOUNTER — Encounter (HOSPITAL_COMMUNITY): Payer: Self-pay

## 2022-04-26 ENCOUNTER — Other Ambulatory Visit: Payer: Self-pay

## 2022-04-26 DIAGNOSIS — K219 Gastro-esophageal reflux disease without esophagitis: Secondary | ICD-10-CM | POA: Diagnosis not present

## 2022-04-26 DIAGNOSIS — R142 Eructation: Secondary | ICD-10-CM | POA: Insufficient documentation

## 2022-04-26 DIAGNOSIS — R109 Unspecified abdominal pain: Secondary | ICD-10-CM | POA: Diagnosis present

## 2022-04-26 DIAGNOSIS — M546 Pain in thoracic spine: Secondary | ICD-10-CM | POA: Diagnosis not present

## 2022-04-26 LAB — URINALYSIS, ROUTINE W REFLEX MICROSCOPIC
Bilirubin Urine: NEGATIVE
Glucose, UA: NEGATIVE mg/dL
Hgb urine dipstick: NEGATIVE
Ketones, ur: NEGATIVE mg/dL
Nitrite: NEGATIVE
Protein, ur: NEGATIVE mg/dL
Specific Gravity, Urine: 1.008 (ref 1.005–1.030)
pH: 7 (ref 5.0–8.0)

## 2022-04-26 LAB — HEPATIC FUNCTION PANEL
ALT: 57 U/L — ABNORMAL HIGH (ref 0–44)
AST: 50 U/L — ABNORMAL HIGH (ref 15–41)
Albumin: 3.6 g/dL (ref 3.5–5.0)
Alkaline Phosphatase: 97 U/L (ref 38–126)
Bilirubin, Direct: <0.1 mg/dL (ref 0.0–0.2)
Total Bilirubin: 0.1 mg/dL — ABNORMAL LOW (ref 0.3–1.2)
Total Protein: 6.8 g/dL (ref 6.5–8.1)

## 2022-04-26 LAB — CBC
HCT: 36.3 % (ref 36.0–46.0)
Hemoglobin: 12.4 g/dL (ref 12.0–15.0)
MCH: 25.8 pg — ABNORMAL LOW (ref 26.0–34.0)
MCHC: 34.2 g/dL (ref 30.0–36.0)
MCV: 75.6 fL — ABNORMAL LOW (ref 80.0–100.0)
Platelets: 265 10*3/uL (ref 150–400)
RBC: 4.8 MIL/uL (ref 3.87–5.11)
RDW: 15.3 % (ref 11.5–15.5)
WBC: 6.6 10*3/uL (ref 4.0–10.5)
nRBC: 0 % (ref 0.0–0.2)

## 2022-04-26 LAB — BASIC METABOLIC PANEL
Anion gap: 10 (ref 5–15)
BUN: 9 mg/dL (ref 8–23)
CO2: 26 mmol/L (ref 22–32)
Calcium: 9.6 mg/dL (ref 8.9–10.3)
Chloride: 103 mmol/L (ref 98–111)
Creatinine, Ser: 0.88 mg/dL (ref 0.44–1.00)
GFR, Estimated: >60 mL/min (ref >60)
Glucose, Bld: 90 mg/dL (ref 70–99)
Potassium: 3.6 mmol/L (ref 3.5–5.1)
Sodium: 139 mmol/L (ref 135–145)

## 2022-04-26 LAB — LIPASE, BLOOD: Lipase: 25 U/L (ref 11–51)

## 2022-04-26 MED ORDER — LIDOCAINE VISCOUS HCL 2 % MT SOLN
15.0000 mL | Freq: Once | OROMUCOSAL | Status: AC
  Administered 2022-04-26: 15 mL via ORAL
  Filled 2022-04-26: qty 15

## 2022-04-26 MED ORDER — PANTOPRAZOLE SODIUM 40 MG IV SOLR
40.0000 mg | Freq: Once | INTRAVENOUS | Status: AC
  Administered 2022-04-26: 40 mg via INTRAVENOUS
  Filled 2022-04-26: qty 10

## 2022-04-26 MED ORDER — IOHEXOL 350 MG/ML SOLN
100.0000 mL | Freq: Once | INTRAVENOUS | Status: AC | PRN
  Administered 2022-04-26: 100 mL via INTRAVENOUS

## 2022-04-26 MED ORDER — ALUM & MAG HYDROXIDE-SIMETH 200-200-20 MG/5ML PO SUSP
30.0000 mL | Freq: Once | ORAL | Status: AC
  Administered 2022-04-26: 30 mL via ORAL
  Filled 2022-04-26: qty 30

## 2022-04-26 NOTE — Discharge Instructions (Signed)
Use ZEGERID OTC (available at [facility]) if you cannot find that take Nexium or Prilosec. You may also use over-the-counter combination Tums/Gas-X.  Follow closely with a gastroenterologist  Use Tylenol and lidocaine patch for the pain on your upper right rib cage area in the back.  Contact a health care provider if: You have: New symptoms. Unexplained weight loss. Difficulty swallowing or it hurts to swallow. Wheezing or a persistent cough. A hoarse voice. Your symptoms do not improve with treatment. Get help right away if: You have sudden pain in your arms, neck, jaw, teeth, or back. You suddenly feel sweaty, dizzy, or light-headed. You have chest pain or shortness of breath. You vomit and the vomit is green, yellow, or black, or it looks like blood or coffee grounds. You faint. You have stool that is red, bloody, or black. You cannot swallow, drink, or eat. These symptoms may represent a serious problem that is an emergency. Do not wait to see if the symptoms will go away. Get medical help right away. Call your local emergency services (911 in the [place] Do not drive yourself to the hospital.

## 2022-04-26 NOTE — ED Notes (Signed)
PA at bedside.

## 2022-04-26 NOTE — ED Provider Notes (Signed)
[facility] Provider Note   CSN: [id number] Arrival date & time: 04/26/22  1339     History  Chief Complaint  Patient presents with   Flank Pain        Back Pain    Alyssa Armstrong is a 72 y.o. 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  Patient states that she has been having uncontrollable belching for 5 days.  She has taken PPI, Rolaids, water and baking soda.  She is only had a few hours of relief.  She states that she was awake all night because she just cannot stop belching.  She never had anything like this before.  She has had mild nausea without vomiting and is not nauseous at this time.  She does complain of some epigastric abdominal pain.  This does not radiate and nothing seems to make it worse or better.  She has been having normal bowel movements and passing gas. She is also complaining of pain in the right mid thoracic region.  It is worse with movement twisting bending pain change in position.  Slightly improved when she is still or lying back.  She denies any known injuries, cough, wheezing, shortness of breath.  She has taken Tylenol without significant relief.  Her son at bedside reports that he has tried to massage the area without any improvement.   Flank Pain  Back Pain      Home Medications Prior to Admission medications   Medication Sig Start Date End Date Taking? Authorizing Provider  acetaminophen (TYLENOL) 500 MG tablet Take 500 mg by mouth daily as needed.    [provider]  albuterol (VENTOLIN HFA) 108 (90 Base) MCG/ACT inhaler Inhale 2 puffs into the lungs every 4 (four) hours as needed. 03/22/20   [provider]  atorvastatin (LIPITOR) 40 MG tablet Take 40 mg by mouth daily.  07/28/19   [provider]  benzonatate (TESSALON) 100 MG capsule Take 100-200 mg by mouth 3 (three) times daily as needed. 03/22/20   [provider]  blood glucose meter kit and supplies KIT Dispense based on patient and insurance preference. Use up to four times daily as directed. 07/12/20   Norm Parcel, PA-C  buPROPion (WELLBUTRIN XL) 150 MG 24 hr tablet Take 150 mg by mouth daily. 09/17/15 04/12/20  [provider]  calcium-vitamin D (OSCAL WITH D) 500-200 MG-UNIT TABS tablet Take 1 tablet by mouth daily.    [provider]  cetirizine (ZYRTEC) 10 MG tablet Take 1 tablet (10 mg total) by mouth daily. Patient taking differently: Take 10 mg by mouth daily as needed for allergies. 09/07/12   Robbie Lis, MD  Cholecalciferol (VITAMIN D3) 25 MCG (1000 UT) CAPS Take 1 capsule by mouth daily.    [provider]  clonazePAM (KLONOPIN) 0.5 MG tablet Take 0.25-0.5 mg by mouth 2 (two) times daily as needed for anxiety. 07/19/19   [provider]  docusate sodium (COLACE) 100 MG capsule Take 1 capsule (100 mg total) by mouth daily as needed for mild constipation. 07/12/20   Norm Parcel, PA-C  ezetimibe (ZETIA) 10 MG tablet Take 10 mg by mouth daily. 07/06/19   [provider]  feeding supplement (ENSURE ENLIVE / ENSURE PLUS) LIQD Take 237 mLs by mouth 3 (  three) times daily between meals. 07/12/20   Norm Parcel, PA-C  glucose blood test strip OneTouch Ultra Blue Test Strip  USE AS DIRECTED TO CHECK BLOOD SUGAR 1 TIME(S)DAILY    [provider]  Lancets (ONETOUCH DELICA PLUS [id number]) MISC OneTouch Delica Plus Lancet 33 gauge  USE AS DIRECTED TO CHECK BLOOD SUGAR 1 TIME(S) DAILY    [provider]  losartan (COZAAR) 50 MG tablet Take 50 mg by mouth daily. 07/07/19   [provider]  ondansetron (ZOFRAN-ODT) 4 MG disintegrating tablet Take 1 tablet (4 mg total) by mouth every 6 (six) hours as needed for nausea.  07/10/20   Stark Klein, MD  oxyCODONE 10 MG TABS Take 0.5-1 tablets (5-10 mg total) by mouth every 6 (six) hours as needed for moderate pain, severe pain or breakthrough pain. 07/12/20   Norm Parcel, PA-C  pantoprazole (PROTONIX) 40 MG tablet Take 1 tablet (40 mg total) by mouth daily at 12 noon. 07/11/20   Stark Klein, MD  polyethylene glycol (MIRALAX / GLYCOLAX) packet Take 17 g by mouth once a week.    [provider]  potassium chloride SA (KLOR-CON) 20 MEQ tablet Take 2 tablets (40 mEq total) by mouth 2 (two) times daily. 07/10/20   Stark Klein, MD  sucralfate (CARAFATE) 1 g tablet Take 1 g by mouth 4 (four) times daily. 03/22/20   [provider]  venlafaxine (EFFEXOR) 75 MG tablet Take 75 mg by mouth 2 (two) times daily. 08/22/15   [provider]      Allergies    Vicodin [hydrocodone-acetaminophen] and Lisinopril    Review of Systems   Review of Systems  Genitourinary:  Positive for flank pain.  Musculoskeletal:  Positive for back pain.    Physical Exam Updated Vital Signs BP (!) 122/56   Pulse 79   Temp 97.9 F (36.6 C) (Oral)   Resp 18   Ht '5\' 4"'$  (1.626 m)   Wt 84.8 kg   SpO2 100%   BMI 32.10 kg/m  Physical Exam Vitals and nursing note reviewed.  Constitutional:      General: She is not in acute distress.    Appearance: She is well-developed. She is not diaphoretic.     Comments: Patient continually belching throughout the entire conversation  HENT:     Head: Normocephalic and atraumatic.     Right Ear: External ear normal.     Left Ear: External ear normal.     Nose: Nose normal.     Mouth/Throat:     Mouth: Mucous membranes are moist.  Eyes:     General: No scleral icterus.    Conjunctiva/sclera: Conjunctivae normal.  Cardiovascular:     Rate and Rhythm: Normal rate and regular rhythm.     Heart sounds: Normal heart sounds. No murmur heard.    No friction rub. No gallop.  Pulmonary:     Effort: Pulmonary effort is normal.  No respiratory distress.     Breath sounds: Normal breath sounds.  Abdominal:     General: Bowel sounds are normal. There is no distension.     Palpations: Abdomen is soft. There is no mass.     Tenderness: There is abdominal tenderness in the epigastric area. There is no right CVA tenderness, left CVA tenderness or guarding.  Musculoskeletal:       Arms:     Cervical back: Normal range of motion.  Skin:    General: Skin is warm and dry.  Neurological:  Mental Status: She is alert and oriented to person, place, and time.  Psychiatric:        Behavior: Behavior normal.     ED Results / Procedures / Treatments   Labs (all labs ordered are listed, but only abnormal results are displayed) Labs Reviewed  URINALYSIS, ROUTINE W REFLEX MICROSCOPIC - Abnormal; Notable for the following components:      Result Value   APPearance HAZY (*)    Leukocytes,Ua LARGE (*)    Bacteria, UA RARE (*)    Non Squamous Epithelial 0-5 (*)    All other components within normal limits  CBC - Abnormal; Notable for the following components:   MCV 75.6 (*)    MCH 25.8 (*)    All other components within normal limits  HEPATIC FUNCTION PANEL - Abnormal; Notable for the following components:   AST 50 (*)    ALT 57 (*)    Total Bilirubin 0.1 (*)    All other components within normal limits  BASIC METABOLIC PANEL  LIPASE, BLOOD    EKG None  Radiology CT CHEST ABDOMEN PELVIS W CONTRAST  Result Date: 04/26/2022 CLINICAL DATA:  Persistent cough for 8 weeks, back and flank pain, belching EXAM: CT CHEST, ABDOMEN, AND PELVIS WITH CONTRAST TECHNIQUE: Multidetector CT imaging of the chest, abdomen and pelvis was performed following the standard protocol during bolus administration of intravenous contrast. RADIATION DOSE REDUCTION: This exam was performed according to the departmental dose-optimization program which includes automated exposure control, adjustment of the mA and/or kV according to patient size  and/or use of iterative reconstruction technique. CONTRAST:  189m OMNIPAQUE IOHEXOL 350 MG/ML SOLN COMPARISON:  04/26/2022, 07/18/2021 FINDINGS: CT CHEST FINDINGS Cardiovascular: The heart and great vessels are unremarkable without pericardial effusion. No evidence of thoracic aortic aneurysm or dissection. Atherosclerosis of the aorta and coronary vasculature. Mediastinum/Nodes: No enlarged mediastinal, hilar, or axillary lymph nodes. Thyroid gland, trachea, and esophagus demonstrate no significant findings. Lungs/Pleura: No acute airspace disease, effusion, or pneumothorax. Central airways are patent. Stable 3 mm left lower lobe nodule reference image 90/5, benign given long-term stability and nodule size. Musculoskeletal: No acute or destructive bony lesions. Reconstructed images demonstrate no additional findings. CT ABDOMEN PELVIS FINDINGS Hepatobiliary: Diffuse hepatic steatosis. No focal parenchymal liver abnormality or intrahepatic biliary duct dilation. Prior cholecystectomy. Pancreas: Prior resection of the pancreatic head. Visualized portions of the pancreatic body and tail are unremarkable. No pancreatic duct dilation. Spleen: Normal in size without focal abnormality. Adrenals/Urinary Tract: Adrenal glands are unremarkable. Kidneys are normal, without renal calculi, focal lesion, or hydronephrosis. Bladder is unremarkable. Stomach/Bowel: No bowel obstruction or ileus. The appendix is not identified and may be surgically absent. Postsurgical changes from prior Whipple procedure. No bowel wall thickening or inflammatory change. Vascular/Lymphatic: Aortic atherosclerosis. No enlarged abdominal or pelvic lymph nodes. Reproductive: Status post hysterectomy. No adnexal masses. Other: No free fluid or free intraperitoneal gas. No abdominal wall hernia. Musculoskeletal: No acute or destructive bony lesions. Reconstructed images demonstrate no additional findings. IMPRESSION: 1. No acute intrathoracic,  intra-abdominal, or intrapelvic process. 2. Postsurgical changes from previous Whipple procedure. No evidence of residual or recurrent disease within the surgical bed at the site of pancreatic head resection. 3. Benign 3 mm left lower lobe pulmonary nodule, unchanged since 2022. No specific imaging follow-up is required. 4.  Aortic Atherosclerosis (ICD10-I70.0). Electronically Signed   By: MRanda NgoM.D.   On: 04/26/2022 16:37   DG ABD ACUTE 2+V W 1V CHEST  Result Date: 04/26/2022 CLINICAL  DATA:  Right flank pain EXAM: DG ABDOMEN ACUTE WITH 1 VIEW CHEST COMPARISON:  Chest x-ray 06/23/2021 FINDINGS: There is no evidence of dilated bowel loops or free intraperitoneal air. There are phleboliths in the pelvis. No suspicious calcifications are identified. Heart size and mediastinal contours are within normal limits. Both lungs are clear. IMPRESSION: Negative abdominal radiographs. No acute cardiopulmonary disease. Electronically Signed   By: Ronney Asters M.D.   On: 04/26/2022 16:03    Procedures Procedures    Medications Ordered in ED Medications  pantoprazole (PROTONIX) injection 40 mg (40 mg Intravenous Given 04/26/22 1633)  iohexol (OMNIPAQUE) 350 MG/ML injection 100 mL (100 mLs Intravenous Contrast Given 04/26/22 1628)  alum & mag hydroxide-simeth (MAALOX/MYLANTA) 200-200-20 MG/5ML suspension 30 mL (30 mLs Oral Given 04/26/22 1726)    And  lidocaine (XYLOCAINE) 2 % viscous mouth solution 15 mL (15 mLs Oral Given 04/26/22 1726)    ED Course/ Medical Decision Making/ A&P Clinical Course as of 04/26/22 1808  Sat Apr 26, 2022  1545 Urinalysis, Routine w reflex microscopic -Urine, Clean Catch(!) Asymptomatic bacteriuria [AH]  1627 DG ABD ACUTE 2+V W 1V CHEST I interpreted acute abdominal films.  No acute findings, no evidence of hiatal hernia. [AH]    Clinical Course User Index [AH] Margarita Mail, PA-C                             Medical Decision Making 72 year old female who presents  emergency department chiefly for belching but also for pain in her right upper thoracic region.  The past medical history significant for neuroendocrine tumor of the pancreas requiring Whipple procedure.  Given her comorbidities I have concern for potential recurrence of disease, mass, diaphragmatic injury, reflux, biliary colic.   Physical examination is most consistent with musculoskeletal right upper back pain as it is reproducible with movement and palpation. Does not have evidence of zoster or other abnormality.  I ordered and reviewed labs.  She has mild elevation of AST ALT, lipase within normal limits, urine appears to have asymptomatic bacteria but she does not have any suprapubic pain or CVA tenderness.  BMP within normal limits, CBC without elevated white blood cell count.  I ordered, visualized and interpreted abdominal plain films and CT chest abdomen and pelvis.  No acute findings are noted on either of these images.  I reviewed these images with the patient.  Patient was given IV Protonix with resolution of her belching.  I also gave her a GI cocktail.  I think that the Protonix  Given no acute findings and use of PPI with improvement in symptoms I suspect eructation secondary to gastroesophageal reflux disease and will have the patient restart on a PPI as she is currently only taking Carafate which she had from a long time ago.  Patient is advised to follow with gastroenterology.  I recommend over-the-counter Zegerid which is a combination PPI and bicarb tablet once a day.  She may also take Tums or Gas-X.  Modified diet.  Follow with PCP and GI.  Discussed return precautions, musculoskeletal pain management and outpatient follow-up.  Amount and/or Complexity of Data Reviewed Labs: ordered. Radiology: ordered.           Final Clinical Impression(s) / ED Diagnoses Final diagnoses:  Eructation  Gastroesophageal reflux disease, unspecified whether esophagitis present     Rx / DC Orders ED Discharge Orders     None  Margarita Mail, PA-C 04/26/22 Evalee Jefferson    Blanchie Dessert, MD 04/26/22 1900

## 2022-04-26 NOTE — ED Notes (Signed)
AVS reviewed with pt prior to discharge. Pt verbalizes understanding. Belongings with pt upon depart. Ambulatory to POV with family.  

## 2022-04-26 NOTE — ED Triage Notes (Signed)
Pt to ED c/o right flank pain and lower back pain since Tuesday. Denies urinary symptoms. No known injury

## 2022-08-07 ENCOUNTER — Other Ambulatory Visit (HOSPITAL_COMMUNITY): Payer: Self-pay | Admitting: Surgery

## 2022-08-07 DIAGNOSIS — D3A8 Other benign neuroendocrine tumors: Secondary | ICD-10-CM

## 2022-08-17 IMAGING — CT NM PET SKULL BASE TO THIGH
1 of 7 series · 1 of 25 positions shown · non-contrast
Comparison: CT 05/25/2020

CLINICAL DATA: Neuroendocrine tumor of the pancreas.

EXAM:
NUCLEAR MEDICINE PET SKULL BASE TO THIGH
TECHNIQUE: 4.1 mCi Aizad 64 DOTATATE was injected intravenously. Full-ring PET
imaging was performed from the skull base to thigh after the
radiotracer. CT data was obtained and used for attenuation
correction and anatomic localization.

[Series 3: pet sk_thigh ac · axial · 5.0mm · 4.07mm/px · 1 of 228 slices shown]
[im 137/228]
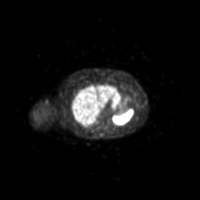

[1 of 25 positions shown; findings below may reference images not displayed]

FINDINGS: NECK

No radiotracer activity in neck lymph nodes.

Incidental CT findings: None

CHEST

No radiotracer accumulation within mediastinal or hilar lymph nodes.
No suspicious pulmonary nodules on the CT scan.

Incidental CT finding:None

ABDOMEN/PELVIS

Rounded soft tissue mass at the head of the pancreas measures 2.5 x
2.5 cm (image 109/series 4) and has intense radiotracer activity
with SUV max equal 71.0.

No pancreatic duct dilatation. No additional peripancreatic nodules
or lymph nodes are present.

No focal abnormal radiotracer accumulation within the liver.

Elsewhere in the abdomen pelvis no mesenteric deposits, peritoneal
nodules or radiotracer avid lymph nodes.

Physiologic activity noted in the liver, spleen, adrenal glands and
kidneys.

Incidental CT findings:Atherosclerotic calcification of the aorta.

SKELETON

No focal activity to suggest skeletal metastasis.

Incidental CT findings:None
IMPRESSION: 1. Intensely radiotracer avid soft tissue ovoid mass at the head of
the pancreas consistent with well differentiated neuroendocrine
tumor.
2. No evidence of metastatic neuroendocrine tumor on skull base to
thigh DOTATATE PET scan.

## 2022-09-02 ENCOUNTER — Encounter (HOSPITAL_COMMUNITY): Payer: Self-pay

## 2022-09-02 ENCOUNTER — Ambulatory Visit (HOSPITAL_COMMUNITY)
Admission: RE | Admit: 2022-09-02 | Discharge: 2022-09-02 | Disposition: A | Discharge status: Home / Self Care | Payer: Medicare Other | Source: Ambulatory Visit | Attending: Surgery | Admitting: Surgery

## 2022-09-02 DIAGNOSIS — E119 Type 2 diabetes mellitus without complications: Secondary | ICD-10-CM | POA: Diagnosis present

## 2022-09-02 DIAGNOSIS — D3A8 Other benign neuroendocrine tumors: Secondary | ICD-10-CM | POA: Diagnosis not present

## 2022-09-02 MED ORDER — SODIUM CHLORIDE (PF) 0.9 % IJ SOLN
INTRAMUSCULAR | Status: AC
  Filled 2022-09-02: qty 50

## 2022-09-02 MED ORDER — IOHEXOL 300 MG/ML  SOLN
100.0000 mL | Freq: Once | INTRAMUSCULAR | Status: AC | PRN
  Administered 2022-09-02: 100 mL via INTRAVENOUS

## 2023-07-02 NOTE — Progress Notes (Unsigned)
  Start: *** end: *** Patient is here today *** Patient would like to learn *** Patient lives with ***.  *** shopping and cooking.  History includes:  *** Medications include:  *** Labs noted:  ***  Lab Results  Component Value Date   HGBA1C 6.9 (H) 06/29/2020   Ensure enlive, vit D, stating, miralax , colace, protonix , carafate

## 2023-07-15 ENCOUNTER — Encounter: Attending: Family Medicine | Admitting: Dietician

## 2023-07-15 DIAGNOSIS — E119 Type 2 diabetes mellitus without complications: Secondary | ICD-10-CM | POA: Diagnosis present

## 2023-07-15 NOTE — Patient Instructions (Addendum)
 Use 9 inch plate planner for balanced meals   1/2 plate of non starchy vegetables, 1/4 protein, 1/4 starchy foods  Continue with physical activity-Keep up the great work   Aim for 30-60 minutes 5-7 days weekly as tolerated with MD consent

## 2023-07-29 ENCOUNTER — Other Ambulatory Visit: Payer: Self-pay | Admitting: Surgery

## 2023-07-29 ENCOUNTER — Encounter: Payer: Self-pay | Admitting: Surgery

## 2023-07-29 DIAGNOSIS — D3A8 Other benign neuroendocrine tumors: Secondary | ICD-10-CM
